# Patient Record
Sex: Female | Born: 1955
Health system: Southern US, Community
[De-identification: ages and names within clinical notes are randomized; demographics above are authoritative.]

## PROBLEM LIST (undated history)

## (undated) DIAGNOSIS — M25475 Effusion, left foot: Secondary | ICD-10-CM

## (undated) DIAGNOSIS — K5792 Diverticulitis of intestine, part unspecified, without perforation or abscess without bleeding: Secondary | ICD-10-CM

## (undated) DIAGNOSIS — M25471 Effusion, right ankle: Secondary | ICD-10-CM

## (undated) DIAGNOSIS — M25474 Effusion, right foot: Secondary | ICD-10-CM

## (undated) DIAGNOSIS — Z91018 Allergy to other foods: Secondary | ICD-10-CM

## (undated) DIAGNOSIS — E039 Hypothyroidism, unspecified: Secondary | ICD-10-CM

## (undated) DIAGNOSIS — I10 Essential (primary) hypertension: Secondary | ICD-10-CM

## (undated) DIAGNOSIS — M25472 Effusion, left ankle: Secondary | ICD-10-CM

## (undated) DIAGNOSIS — F419 Anxiety disorder, unspecified: Secondary | ICD-10-CM

## (undated) DIAGNOSIS — E785 Hyperlipidemia, unspecified: Secondary | ICD-10-CM

## (undated) DIAGNOSIS — I890 Lymphedema, not elsewhere classified: Secondary | ICD-10-CM

## (undated) DIAGNOSIS — E119 Type 2 diabetes mellitus without complications: Secondary | ICD-10-CM

## (undated) DIAGNOSIS — R7303 Prediabetes: Secondary | ICD-10-CM

## (undated) DIAGNOSIS — E669 Obesity, unspecified: Secondary | ICD-10-CM

## (undated) DIAGNOSIS — E079 Disorder of thyroid, unspecified: Secondary | ICD-10-CM

## (undated) HISTORY — DX: Effusion, left ankle: M25.472

## (undated) HISTORY — DX: Effusion, right foot: M25.474

## (undated) HISTORY — PX: ECTOPIC PREGNANCY SURGERY: SHX613

## (undated) HISTORY — DX: Obesity, unspecified: E66.9

## (undated) HISTORY — DX: Type 2 diabetes mellitus without complications: E11.9

## (undated) HISTORY — DX: Allergy to other foods: Z91.018

## (undated) HISTORY — DX: Hyperlipidemia, unspecified: E78.5

## (undated) HISTORY — DX: Hypothyroidism, unspecified: E03.9

## (undated) HISTORY — DX: Lymphedema, not elsewhere classified: I89.0

## (undated) HISTORY — DX: Effusion, right ankle: M25.471

## (undated) HISTORY — DX: Diverticulitis of intestine, part unspecified, without perforation or abscess without bleeding: K57.92

## (undated) HISTORY — DX: Essential (primary) hypertension: I10

## (undated) HISTORY — DX: Disorder of thyroid, unspecified: E07.9

## (undated) HISTORY — DX: Prediabetes: R73.03

## (undated) HISTORY — DX: Effusion, left foot: M25.475

## (undated) HISTORY — DX: Anxiety disorder, unspecified: F41.9

---

## 1998-09-19 ENCOUNTER — Ambulatory Visit (HOSPITAL_COMMUNITY): Admission: RE | Admit: 1998-09-19 | Discharge: 1998-09-19 | Payer: Self-pay | Admitting: Obstetrics and Gynecology

## 1998-09-19 ENCOUNTER — Encounter: Payer: Self-pay | Admitting: Obstetrics and Gynecology

## 2000-02-27 ENCOUNTER — Other Ambulatory Visit: Admission: RE | Admit: 2000-02-27 | Discharge: 2000-02-27 | Payer: Self-pay | Admitting: Obstetrics and Gynecology

## 2000-05-12 ENCOUNTER — Emergency Department (HOSPITAL_COMMUNITY): Admission: EM | Admit: 2000-05-12 | Discharge: 2000-05-12 | Payer: Self-pay

## 2001-02-27 ENCOUNTER — Other Ambulatory Visit: Admission: RE | Admit: 2001-02-27 | Discharge: 2001-02-27 | Payer: Self-pay | Admitting: Obstetrics and Gynecology

## 2004-09-07 ENCOUNTER — Ambulatory Visit: Payer: Self-pay | Admitting: Family Medicine

## 2004-09-13 ENCOUNTER — Ambulatory Visit: Payer: Self-pay | Admitting: Family Medicine

## 2005-05-22 ENCOUNTER — Ambulatory Visit: Payer: Self-pay | Admitting: Family Medicine

## 2005-07-09 ENCOUNTER — Ambulatory Visit: Payer: Self-pay | Admitting: Family Medicine

## 2006-02-14 ENCOUNTER — Ambulatory Visit: Payer: Self-pay | Admitting: Family Medicine

## 2006-06-11 ENCOUNTER — Ambulatory Visit: Payer: Self-pay | Admitting: Family Medicine

## 2006-12-16 ENCOUNTER — Ambulatory Visit: Payer: Self-pay | Admitting: Family Medicine

## 2006-12-17 ENCOUNTER — Encounter: Payer: Self-pay | Admitting: Family Medicine

## 2006-12-30 ENCOUNTER — Ambulatory Visit: Payer: Self-pay | Admitting: Family Medicine

## 2006-12-30 LAB — CONVERTED CEMR LAB
ALT: 19 units/L (ref 0–40)
AST: 20 units/L (ref 0–37)
Albumin: 3.5 g/dL (ref 3.5–5.2)
Alkaline Phosphatase: 53 units/L (ref 39–117)
Basophils Absolute: 0.1 10*3/uL (ref 0.0–0.1)
Basophils Relative: 2.3 % — ABNORMAL HIGH (ref 0.0–1.0)
Bilirubin, Direct: 0.1 mg/dL (ref 0.0–0.3)
Eosinophils Absolute: 0.1 10*3/uL (ref 0.0–0.6)
Eosinophils Relative: 3.1 % (ref 0.0–5.0)
Free T4: 0.7 ng/dL (ref 0.6–1.6)
HCT: 37 % (ref 36.0–46.0)
Hemoglobin: 12.6 g/dL (ref 12.0–15.0)
Lymphocytes Relative: 37.6 % (ref 12.0–46.0)
MCHC: 34.1 g/dL (ref 30.0–36.0)
MCV: 88.5 fL (ref 78.0–100.0)
Monocytes Absolute: 0.4 10*3/uL (ref 0.2–0.7)
Monocytes Relative: 9.5 % (ref 3.0–11.0)
Neutro Abs: 1.7 10*3/uL (ref 1.4–7.7)
Neutrophils Relative %: 47.5 % (ref 43.0–77.0)
Platelets: 226 10*3/uL (ref 150–400)
RBC: 4.18 M/uL (ref 3.87–5.11)
RDW: 14.2 % (ref 11.5–14.6)
T3, Free: 2.5 pg/mL (ref 2.3–4.2)
TSH: 4.61 microintl units/mL (ref 0.35–5.50)
Total Bilirubin: 0.6 mg/dL (ref 0.3–1.2)
Total Protein: 7 g/dL (ref 6.0–8.3)
WBC: 3.7 10*3/uL — ABNORMAL LOW (ref 4.5–10.5)

## 2007-04-21 ENCOUNTER — Telehealth: Payer: Self-pay | Admitting: Family Medicine

## 2007-04-21 DIAGNOSIS — E039 Hypothyroidism, unspecified: Secondary | ICD-10-CM | POA: Insufficient documentation

## 2007-04-30 ENCOUNTER — Encounter (INDEPENDENT_AMBULATORY_CARE_PROVIDER_SITE_OTHER): Payer: Self-pay | Admitting: *Deleted

## 2007-06-30 ENCOUNTER — Encounter: Payer: Self-pay | Admitting: Family Medicine

## 2007-08-03 ENCOUNTER — Telehealth (INDEPENDENT_AMBULATORY_CARE_PROVIDER_SITE_OTHER): Payer: Self-pay | Admitting: *Deleted

## 2007-11-17 ENCOUNTER — Ambulatory Visit: Payer: Self-pay | Admitting: Family Medicine

## 2007-11-17 DIAGNOSIS — I10 Essential (primary) hypertension: Secondary | ICD-10-CM | POA: Insufficient documentation

## 2007-11-17 DIAGNOSIS — R079 Chest pain, unspecified: Secondary | ICD-10-CM | POA: Insufficient documentation

## 2007-11-19 ENCOUNTER — Encounter (INDEPENDENT_AMBULATORY_CARE_PROVIDER_SITE_OTHER): Payer: Self-pay | Admitting: *Deleted

## 2007-12-04 ENCOUNTER — Telehealth (INDEPENDENT_AMBULATORY_CARE_PROVIDER_SITE_OTHER): Payer: Self-pay | Admitting: *Deleted

## 2007-12-10 ENCOUNTER — Ambulatory Visit: Payer: Self-pay | Admitting: Family Medicine

## 2008-01-05 ENCOUNTER — Encounter: Payer: Self-pay | Admitting: Family Medicine

## 2008-04-26 ENCOUNTER — Ambulatory Visit: Payer: Self-pay | Admitting: Family Medicine

## 2008-04-26 ENCOUNTER — Encounter (INDEPENDENT_AMBULATORY_CARE_PROVIDER_SITE_OTHER): Payer: Self-pay | Admitting: *Deleted

## 2008-04-26 DIAGNOSIS — O009 Unspecified ectopic pregnancy without intrauterine pregnancy: Secondary | ICD-10-CM

## 2008-05-02 ENCOUNTER — Ambulatory Visit: Payer: Self-pay | Admitting: Gastroenterology

## 2008-05-05 ENCOUNTER — Encounter (INDEPENDENT_AMBULATORY_CARE_PROVIDER_SITE_OTHER): Payer: Self-pay | Admitting: *Deleted

## 2008-06-17 ENCOUNTER — Encounter: Payer: Self-pay | Admitting: Gastroenterology

## 2008-06-17 ENCOUNTER — Ambulatory Visit: Payer: Self-pay | Admitting: Gastroenterology

## 2008-06-21 ENCOUNTER — Encounter: Payer: Self-pay | Admitting: Gastroenterology

## 2008-08-22 ENCOUNTER — Ambulatory Visit: Payer: Self-pay | Admitting: Family Medicine

## 2008-08-22 DIAGNOSIS — R609 Edema, unspecified: Secondary | ICD-10-CM

## 2008-08-23 LAB — CONVERTED CEMR LAB
BUN: 12 mg/dL (ref 6–23)
CO2: 29 meq/L (ref 19–32)
Calcium: 9 mg/dL (ref 8.4–10.5)
Chloride: 105 meq/L (ref 96–112)
Creatinine, Ser: 0.8 mg/dL (ref 0.4–1.2)
GFR calc Af Amer: 97 mL/min
GFR calc non Af Amer: 80 mL/min
Glucose, Bld: 89 mg/dL (ref 70–99)
Potassium: 3.7 meq/L (ref 3.5–5.1)
Sodium: 141 meq/L (ref 135–145)

## 2008-08-24 ENCOUNTER — Telehealth (INDEPENDENT_AMBULATORY_CARE_PROVIDER_SITE_OTHER): Payer: Self-pay | Admitting: *Deleted

## 2009-04-20 ENCOUNTER — Encounter (INDEPENDENT_AMBULATORY_CARE_PROVIDER_SITE_OTHER): Payer: Self-pay | Admitting: *Deleted

## 2009-05-12 ENCOUNTER — Telehealth (INDEPENDENT_AMBULATORY_CARE_PROVIDER_SITE_OTHER): Payer: Self-pay | Admitting: *Deleted

## 2009-06-16 ENCOUNTER — Ambulatory Visit: Payer: Self-pay | Admitting: Family Medicine

## 2009-06-16 DIAGNOSIS — R300 Dysuria: Secondary | ICD-10-CM | POA: Insufficient documentation

## 2009-06-17 ENCOUNTER — Encounter: Payer: Self-pay | Admitting: Family Medicine

## 2009-06-20 ENCOUNTER — Encounter: Payer: Self-pay | Admitting: Family Medicine

## 2009-07-17 ENCOUNTER — Telehealth: Payer: Self-pay | Admitting: Family Medicine

## 2010-02-26 ENCOUNTER — Telehealth (INDEPENDENT_AMBULATORY_CARE_PROVIDER_SITE_OTHER): Payer: Self-pay | Admitting: *Deleted

## 2010-03-02 ENCOUNTER — Encounter (INDEPENDENT_AMBULATORY_CARE_PROVIDER_SITE_OTHER): Payer: Self-pay | Admitting: *Deleted

## 2010-03-20 ENCOUNTER — Ambulatory Visit: Payer: Self-pay | Admitting: Family Medicine

## 2010-03-30 ENCOUNTER — Ambulatory Visit: Payer: Self-pay | Admitting: Family Medicine

## 2010-04-02 ENCOUNTER — Telehealth (INDEPENDENT_AMBULATORY_CARE_PROVIDER_SITE_OTHER): Payer: Self-pay | Admitting: *Deleted

## 2010-04-02 LAB — CONVERTED CEMR LAB
ALT: 20 units/L (ref 0–35)
AST: 18 units/L (ref 0–37)
Albumin: 3.9 g/dL (ref 3.5–5.2)
Alkaline Phosphatase: 84 units/L (ref 39–117)
BUN: 16 mg/dL (ref 6–23)
Bilirubin, Direct: 0.1 mg/dL (ref 0.0–0.3)
CO2: 30 meq/L (ref 19–32)
Calcium: 9 mg/dL (ref 8.4–10.5)
Chloride: 108 meq/L (ref 96–112)
Cholesterol: 227 mg/dL — ABNORMAL HIGH (ref 0–200)
Creatinine, Ser: 0.8 mg/dL (ref 0.4–1.2)
Direct LDL: 176.9 mg/dL
GFR calc non Af Amer: 92.07 mL/min (ref >60)
Glucose, Bld: 88 mg/dL (ref 70–99)
HDL: 39.8 mg/dL (ref >39.00)
Potassium: 4.1 meq/L (ref 3.5–5.1)
Sodium: 144 meq/L (ref 135–145)
Total Bilirubin: 0.3 mg/dL (ref 0.3–1.2)
Total CHOL/HDL Ratio: 6
Total Protein: 7.5 g/dL (ref 6.0–8.3)
Triglycerides: 117 mg/dL (ref 0.0–149.0)
VLDL: 23.4 mg/dL (ref 0.0–40.0)

## 2010-04-17 ENCOUNTER — Ambulatory Visit: Payer: Self-pay | Admitting: Family Medicine

## 2010-05-25 ENCOUNTER — Ambulatory Visit: Payer: Self-pay | Admitting: Family Medicine

## 2010-06-08 ENCOUNTER — Ambulatory Visit: Payer: Self-pay | Admitting: Family Medicine

## 2010-06-08 DIAGNOSIS — H6590 Unspecified nonsuppurative otitis media, unspecified ear: Secondary | ICD-10-CM | POA: Insufficient documentation

## 2010-06-13 LAB — CONVERTED CEMR LAB
ALT: 19 units/L (ref 0–35)
AST: 18 units/L (ref 0–37)
Albumin: 3.6 g/dL (ref 3.5–5.2)
Alkaline Phosphatase: 73 units/L (ref 39–117)
BUN: 13 mg/dL (ref 6–23)
Basophils Absolute: 0 10*3/uL (ref 0.0–0.1)
Basophils Relative: 0.7 % (ref 0.0–3.0)
Bilirubin, Direct: 0.2 mg/dL (ref 0.0–0.3)
CO2: 28 meq/L (ref 19–32)
Calcium: 8.8 mg/dL (ref 8.4–10.5)
Chloride: 103 meq/L (ref 96–112)
Cholesterol: 210 mg/dL — ABNORMAL HIGH (ref 0–200)
Creatinine, Ser: 0.8 mg/dL (ref 0.4–1.2)
Direct LDL: 163.7 mg/dL
Eosinophils Absolute: 0.1 10*3/uL (ref 0.0–0.7)
Eosinophils Relative: 3.1 % (ref 0.0–5.0)
GFR calc non Af Amer: 100.32 mL/min (ref >60)
Glucose, Bld: 82 mg/dL (ref 70–99)
HCT: 37.9 % (ref 36.0–46.0)
HDL: 38.8 mg/dL — ABNORMAL LOW (ref >39.00)
Hemoglobin: 12.8 g/dL (ref 12.0–15.0)
Lymphocytes Relative: 41.9 % (ref 12.0–46.0)
Lymphs Abs: 1.7 10*3/uL (ref 0.7–4.0)
MCHC: 33.8 g/dL (ref 30.0–36.0)
MCV: 89.3 fL (ref 78.0–100.0)
Monocytes Absolute: 0.3 10*3/uL (ref 0.1–1.0)
Monocytes Relative: 8.1 % (ref 3.0–12.0)
Neutro Abs: 1.9 10*3/uL (ref 1.4–7.7)
Neutrophils Relative %: 46.2 % (ref 43.0–77.0)
Platelets: 185 10*3/uL (ref 150.0–400.0)
Potassium: 4 meq/L (ref 3.5–5.1)
RBC: 4.24 M/uL (ref 3.87–5.11)
RDW: 14.9 % — ABNORMAL HIGH (ref 11.5–14.6)
Sodium: 139 meq/L (ref 135–145)
TSH: 0.3 microintl units/mL — ABNORMAL LOW (ref 0.35–5.50)
Total Bilirubin: 0.6 mg/dL (ref 0.3–1.2)
Total CHOL/HDL Ratio: 5
Total Protein: 6.9 g/dL (ref 6.0–8.3)
Triglycerides: 88 mg/dL (ref 0.0–149.0)
VLDL: 17.6 mg/dL (ref 0.0–40.0)
WBC: 4 10*3/uL — ABNORMAL LOW (ref 4.5–10.5)

## 2010-06-27 ENCOUNTER — Ambulatory Visit: Payer: Self-pay | Admitting: Family Medicine

## 2010-06-27 DIAGNOSIS — E785 Hyperlipidemia, unspecified: Secondary | ICD-10-CM | POA: Insufficient documentation

## 2010-06-27 DIAGNOSIS — R011 Cardiac murmur, unspecified: Secondary | ICD-10-CM

## 2010-07-12 ENCOUNTER — Ambulatory Visit (HOSPITAL_COMMUNITY)
Admission: RE | Admit: 2010-07-12 | Discharge: 2010-07-12 | Payer: Self-pay | Source: Home / Self Care | Admitting: Family Medicine

## 2010-07-12 ENCOUNTER — Encounter: Payer: Self-pay | Admitting: Family Medicine

## 2010-07-12 ENCOUNTER — Ambulatory Visit: Payer: Self-pay | Admitting: Cardiovascular Disease

## 2010-07-12 ENCOUNTER — Ambulatory Visit: Payer: Self-pay

## 2010-07-20 ENCOUNTER — Ambulatory Visit: Payer: Self-pay | Admitting: Family Medicine

## 2010-08-28 ENCOUNTER — Telehealth (INDEPENDENT_AMBULATORY_CARE_PROVIDER_SITE_OTHER): Payer: Self-pay | Admitting: *Deleted

## 2010-11-04 LAB — CONVERTED CEMR LAB
ALT: 16 units/L (ref 0–35)
ALT: 25 units/L (ref 0–35)
AST: 15 units/L (ref 0–37)
AST: 22 units/L (ref 0–37)
Albumin: 3.5 g/dL (ref 3.5–5.2)
Albumin: 3.7 g/dL (ref 3.5–5.2)
Alkaline Phosphatase: 60 units/L (ref 39–117)
Alkaline Phosphatase: 64 units/L (ref 39–117)
BUN: 10 mg/dL (ref 6–23)
BUN: 10 mg/dL (ref 6–23)
BUN: 13 mg/dL (ref 6–23)
Basophils Absolute: 0 10*3/uL (ref 0.0–0.1)
Basophils Absolute: 0 10*3/uL (ref 0.0–0.1)
Basophils Relative: 0.7 % (ref 0.0–3.0)
Basophils Relative: 1.1 % (ref 0.0–3.0)
Bilirubin Urine: NEGATIVE
Bilirubin, Direct: 0 mg/dL (ref 0.0–0.3)
Bilirubin, Direct: 0.1 mg/dL (ref 0.0–0.3)
CO2: 29 meq/L (ref 19–32)
CO2: 30 meq/L (ref 19–32)
CO2: 30 meq/L (ref 19–32)
Calcium: 8.8 mg/dL (ref 8.4–10.5)
Calcium: 8.8 mg/dL (ref 8.4–10.5)
Calcium: 9.2 mg/dL (ref 8.4–10.5)
Chloride: 102 meq/L (ref 96–112)
Chloride: 105 meq/L (ref 96–112)
Chloride: 106 meq/L (ref 96–112)
Cholesterol: 172 mg/dL (ref 0–200)
Cholesterol: 190 mg/dL (ref 0–200)
Creatinine, Ser: 0.7 mg/dL (ref 0.4–1.2)
Creatinine, Ser: 0.8 mg/dL (ref 0.4–1.2)
Creatinine, Ser: 0.8 mg/dL (ref 0.4–1.2)
Eosinophils Absolute: 0.1 10*3/uL (ref 0.0–0.7)
Eosinophils Absolute: 0.1 10*3/uL (ref 0.0–0.7)
Eosinophils Relative: 2.2 % (ref 0.0–5.0)
Eosinophils Relative: 3 % (ref 0.0–5.0)
GFR calc Af Amer: 97 mL/min
GFR calc Af Amer: 97 mL/min
GFR calc non Af Amer: 112.4 mL/min (ref >60)
GFR calc non Af Amer: 80 mL/min
GFR calc non Af Amer: 80 mL/min
Glucose, Bld: 86 mg/dL (ref 70–99)
Glucose, Bld: 98 mg/dL (ref 70–99)
Glucose, Bld: 99 mg/dL (ref 70–99)
Glucose, Urine, Semiquant: NEGATIVE
HCT: 35.9 % — ABNORMAL LOW (ref 36.0–46.0)
HCT: 35.9 % — ABNORMAL LOW (ref 36.0–46.0)
HDL: 34.9 mg/dL — ABNORMAL LOW (ref >39.00)
HDL: 38.5 mg/dL — ABNORMAL LOW (ref >39.0)
Hemoglobin: 12.2 g/dL (ref 12.0–15.0)
Hemoglobin: 12.4 g/dL (ref 12.0–15.0)
Ketones, urine, test strip: NEGATIVE
LDL Cholesterol: 125 mg/dL — ABNORMAL HIGH (ref 0–99)
LDL Cholesterol: 137 mg/dL — ABNORMAL HIGH (ref 0–99)
Lymphocytes Relative: 35 % (ref 12.0–46.0)
Lymphocytes Relative: 36.3 % (ref 12.0–46.0)
Lymphs Abs: 1.5 10*3/uL (ref 0.7–4.0)
MCHC: 34.1 g/dL (ref 30.0–36.0)
MCHC: 34.7 g/dL (ref 30.0–36.0)
MCV: 87.4 fL (ref 78.0–100.0)
MCV: 91.9 fL (ref 78.0–100.0)
Monocytes Absolute: 0.4 10*3/uL (ref 0.1–1.0)
Monocytes Absolute: 0.4 10*3/uL (ref 0.1–1.0)
Monocytes Relative: 8.3 % (ref 3.0–12.0)
Monocytes Relative: 9.3 % (ref 3.0–12.0)
Neutro Abs: 1.9 10*3/uL (ref 1.4–7.7)
Neutro Abs: 2.3 10*3/uL (ref 1.4–7.7)
Neutrophils Relative %: 50.3 % (ref 43.0–77.0)
Neutrophils Relative %: 53.8 % (ref 43.0–77.0)
Nitrite: NEGATIVE
Platelets: 182 10*3/uL (ref 150.0–400.0)
Platelets: 246 10*3/uL (ref 150–400)
Potassium: 3.7 meq/L (ref 3.5–5.1)
Potassium: 3.7 meq/L (ref 3.5–5.1)
Potassium: 4.1 meq/L (ref 3.5–5.1)
RBC: 3.91 M/uL (ref 3.87–5.11)
RBC: 4.11 M/uL (ref 3.87–5.11)
RDW: 13.6 % (ref 11.5–14.6)
RDW: 14.9 % — ABNORMAL HIGH (ref 11.5–14.6)
Sodium: 138 meq/L (ref 135–145)
Sodium: 139 meq/L (ref 135–145)
Sodium: 140 meq/L (ref 135–145)
Specific Gravity, Urine: 1.02
TSH: 1.42 microintl units/mL (ref 0.35–5.50)
Total Bilirubin: 0.6 mg/dL (ref 0.3–1.2)
Total Bilirubin: 0.8 mg/dL (ref 0.3–1.2)
Total CHOL/HDL Ratio: 4.9
Total CHOL/HDL Ratio: 5
Total Protein: 6.7 g/dL (ref 6.0–8.3)
Total Protein: 7.3 g/dL (ref 6.0–8.3)
Triglycerides: 60 mg/dL (ref 0.0–149.0)
Triglycerides: 71 mg/dL (ref 0–149)
Urobilinogen, UA: 0.2
VLDL: 12 mg/dL (ref 0.0–40.0)
VLDL: 14 mg/dL (ref 0–40)
WBC Urine, dipstick: NEGATIVE
WBC: 3.8 10*3/uL — ABNORMAL LOW (ref 4.5–10.5)
WBC: 4.3 10*3/uL — ABNORMAL LOW (ref 4.5–10.5)
pH: 6.5

## 2010-11-08 NOTE — Progress Notes (Signed)
Summary: --reaction to med  Phone Note Refill Request Call back at Work Phone [phone]   Refills Requested: Medication #1:  EXFORGE 10-320 MG TABS 1 by mouth once daily Pt BP med was just increased to the following. Pt was advise to call if sample cause her any problems. Pt states that she is having swelling in ankle and feet.Marland KitchenMarland KitchenPt uses [facility].Marland Kitchen Pls advise.........Marland KitchenFelecia Deloach CMA  August 28, 2010 3:56 PM    Follow-up for Phone Call        decrease back to exforge 5/320 increase toprol 100mg   1/2 in am and 1 at night Follow-up by: Loreen Freud DO,  August 28, 2010 4:02 PM  Additional Follow-up for Phone Call Additional follow up Details #1::        left message to call office...........Marland KitchenFelecia Deloach CMA  August 28, 2010 4:08 PM   Pt aware, rx sent to pharmacy...................Marland KitchenFelecia Deloach CMA  August 28, 2010 4:23 PM     New/Updated Medications: TOPROL XL 100 MG XR24H-TAB (METOPROLOL SUCCINATE) Take 1/2 tab in am and 1 tab at night EXFORGE 5-320 MG TABS (AMLODIPINE BESYLATE-VALSARTAN) Take 1 tab once daily Prescriptions: TOPROL XL 100 MG XR24H-TAB (METOPROLOL SUCCINATE) Take 1/2 tab in am and 1 tab at night  #45 x 3   Entered by:   Jeremy Johann CMA   Authorized by:   Loreen Freud DO   Signed by:   Jeremy Johann CMA on 08/28/2010   Method used:   Faxed to ...       [facility] #[id number]* (retail)       [address]       [place], [place]  [address]       Ph: [phone]       Fax: [phone]   RxID:   [id number] EXFORGE 5-320 MG TABS (AMLODIPINE BESYLATE-VALSARTAN) Take 1 tab once daily  #90 x 3   Entered by:   Jeremy Johann CMA   Authorized by:   Loreen Freud DO   Signed by:   Jeremy Johann CMA on 08/28/2010   Method used:   Faxed to ...       [facility] #[id number]* (retail)       [address]       [place], [place]  [address]       Ph:  [phone]       Fax: [phone]   RxID:   [id number]

## 2010-11-08 NOTE — Assessment & Plan Note (Signed)
Summary: bp check/cbs   Vital Signs:  Patient profile:   55 year old female Weight:      278. pounds Temp:     98.3 degrees F oral Pulse rate:   68 / minute Pulse rhythm:   regular BP sitting:   160 / 100  (left arm) Cuff size:   large  Vitals Entered By: Almeta Monas CMA Duncan Dull) (June 27, 2010 9:49 AM) CC: BP CHECK and discuss welchol    Current Medications (verified): 1)  Aciphex 20 Mg Tbec (Rabeprazole Sodium) .... Take 1 Tablet By Mouth Once A Day 2)  Furosemide 40 Mg  Tabs (Furosemide) .... Take One Tablet Once Daily 3)  Toprol Xl 100 Mg Xr24h-Tab (Metoprolol Succinate) .Marland Kitchen.. 1 By Mouth Once Daily 4)  Potassium Chloride Crys Cr 20 Meq Cr-Tabs (Potassium Chloride Crys Cr) .... Take 1 Tab Once Daily  Pt Needs  Labs Done For Future Refills 5)  Synthroid 100 Mcg Tabs (Levothyroxine Sodium) .... Take One Tablet By Mouth Every Day**labs Due Now** 6)  Cytomel 5 Mcg Tabs (Liothyronine Sodium) .... Take 2 Tablets By Mouth Two Times A Day 7)  Lipitor 10 Mg Tabs (Atorvastatin Calcium) .Marland Kitchen.. 1 By Mouth At Bedtime. 8)  Diovan 320 Mg Tabs (Valsartan) .Marland Kitchen.. 1 By Mouth Once Daily 9)  Bd Assure Bpm/manual Arm Cuff  Misc (Blood Pressure Monitoring) .... Monitor Bp Daily 10)  Nasonex 50 Mcg/act Susp (Mometasone Furoate) .... 2 Sprays Each Nostril Once Daily 11)  Claritin 10 Mg Tabs (Loratadine) .Marland Kitchen.. 1 By Mouth Once Daily As Needed  Allergies (verified): 1)  ! Sulfa 2)  ! * Contrast Dye  Past History:  Past Medical History: Hypertension Current Problems:  MORBID OBESITY (ICD-278.01) CHEST PAIN UNSPECIFIED (ICD-786.50) FAMILY HISTORY OF CAD FEMALE 1ST DEGREE RELATIVE <60 (ICD-V16.49) HYPERTENSION (ICD-401.9) HYPOTHYROIDISM NOS (ICD-244.9) Hyperlipidemia   Impression & Recommendations:  Problem # 1:  HYPERTENSION (ICD-401.9)  Her updated medication list for this problem includes:    Furosemide 40 Mg Tabs (Furosemide) .Marland Kitchen... Take one tablet once daily    Toprol Xl 100 Mg  Xr24h-tab (Metoprolol succinate) .Marland Kitchen... 1 by mouth once daily    Exforge 5-320 Mg Tabs (Amlodipine besylate-valsartan) .Marland Kitchen... 1 by mouth once daily  Orders: Echo Referral (Echo)  BP today: 160/100 Prior BP: 160/110 (06/08/2010)  Labs Reviewed: K+: 4.0 (06/08/2010) Creat: : 0.8 (06/08/2010)   Chol: 210 (06/08/2010)   HDL: 38.80 (06/08/2010)   LDL: 125 (06/16/2009)   TG: 88.0 (06/08/2010)  Problem # 2:  CARDIAC MURMUR (ICD-785.2)  Orders: Echo Referral (Echo)  EKG obtained (see interpretation); Will refer to cardiology for evaluation of this murmur.   Problem # 3:  HYPOTHYROIDISM NOS (ICD-244.9)  Her updated medication list for this problem includes:    Synthroid 100 Mcg Tabs (Levothyroxine sodium) .Marland Kitchen... Take one tablet by mouth every day**labs due now**    Cytomel 5 Mcg Tabs (Liothyronine sodium) .Marland Kitchen... Take 2 tablets by mouth two times a day  Labs Reviewed: TSH: 0.30 (06/08/2010)    Chol: 210 (06/08/2010)   HDL: 38.80 (06/08/2010)   LDL: 125 (06/16/2009)   TG: 88.0 (06/08/2010)  Problem # 4:  HYPERLIPIDEMIA (ICD-272.4) lipitor caused muscle aches Her updated medication list for this problem includes:    Welchol 3.75 Gm Pack (Colesevelam hcl) .Marland Kitchen... 1 pack dailyas directed  Labs Reviewed: SGOT: 18 (06/08/2010)   SGPT: 19 (06/08/2010)   HDL:38.80 (06/08/2010), 39.80 (03/30/2010)  LDL:125 (06/16/2009), 137 (04/26/2008)  Chol:210 (06/08/2010), 227 (03/30/2010)  Trig:88.0 (06/08/2010), 117.0 (03/30/2010)  Complete Medication List: 1)  Aciphex 20 Mg Tbec (Rabeprazole sodium) .... Take 1 tablet by mouth once a day 2)  Furosemide 40 Mg Tabs (Furosemide) .... Take one tablet once daily 3)  Toprol Xl 100 Mg Xr24h-tab (Metoprolol succinate) .Marland Kitchen.. 1 by mouth once daily 4)  Potassium Chloride Crys Cr 20 Meq Cr-tabs (Potassium chloride crys cr) .... Take 1 tab once daily  pt needs  labs done for future refills 5)  Synthroid 100 Mcg Tabs (Levothyroxine sodium) .... Take one tablet by mouth  every day**labs due now** 6)  Cytomel 5 Mcg Tabs (Liothyronine sodium) .... Take 2 tablets by mouth two times a day 7)  Welchol 3.75 Gm Pack (Colesevelam hcl) .Marland Kitchen.. 1 pack dailyas directed 8)  Exforge 5-320 Mg Tabs (Amlodipine besylate-valsartan) .Marland Kitchen.. 1 by mouth once daily 9)  Bd Assure Bpm/manual Arm Cuff Misc (Blood pressure monitoring) .... Monitor bp daily 10)  Nasonex 50 Mcg/act Susp (Mometasone furoate) .... 2 sprays each nostril once daily 11)  Claritin 10 Mg Tabs (Loratadine) .Marland Kitchen.. 1 by mouth once daily as needed  Patient Instructions: 1)  Please schedule a follow-up appointment in 2 weeks.  2)  repeat labs in 3 months Prescriptions: ACIPHEX 20 MG TBEC (RABEPRAZOLE SODIUM) Take 1 tablet by mouth once a day  #30.0 Each x 11   Entered and Authorized by:   Loreen Freud DO   Signed by:   Loreen Freud DO on 06/27/2010   Method used:   Electronically to        [facility] #[id number]* (retail)       [address]       [place], [place]  [address]       Ph: [phone]       Fax: [phone]   RxID:   [id number] EXFORGE 5-320 MG TABS (AMLODIPINE BESYLATE-VALSARTAN) 1 by mouth once daily  #90 x 3   Entered and Authorized by:   Loreen Freud DO   Signed by:   Loreen Freud DO on 06/27/2010   Method used:   Print then Give to Patient   RxID:   [id number]   Appended Document: bp check/cbs     Allergies: 1)  ! Sulfa 2)  ! * Contrast Dye  Physical Exam  General:  Well-developed,well-nourished,in no acute distress; alert,appropriate and cooperative throughout examination Neck:  No deformities, masses, or tenderness noted. Lungs:  Normal respiratory effort, chest expands symmetrically. Lungs are clear to auscultation, no crackles or wheezes. Heart:  normal rate and no murmur.   Extremities:  trace left pedal edema and trace right pedal edema.   Psych:  Oriented X3 and normally interactive.     Complete Medication List: 1)   Aciphex 20 Mg Tbec (Rabeprazole sodium) .... Take 1 tablet by mouth once a day 2)  Furosemide 40 Mg Tabs (Furosemide) .... Take one tablet once daily 3)  Toprol Xl 100 Mg Xr24h-tab (Metoprolol succinate) .Marland Kitchen.. 1 by mouth once daily 4)  Potassium Chloride Crys Cr 20 Meq Cr-tabs (Potassium chloride crys cr) .... Take 1 tab once daily  pt needs  labs done for future refills 5)  Synthroid 88 Mcg Tabs (Levothyroxine sodium) .Marland Kitchen.. 1 by mouth qd 6)  Cytomel 5 Mcg Tabs (Liothyronine sodium) .... Take 2 tablets by mouth two times a day 7)  Welchol 3.75 Gm Pack (Colesevelam hcl) .Marland Kitchen.. 1 pack dailyas directed 8)  Exforge 5-320 Mg Tabs (Amlodipine besylate-valsartan) .Marland Kitchen.. 1 by  mouth once daily 9)  Bd Assure Bpm/manual Arm Cuff Misc (Blood pressure monitoring) .... Monitor bp daily 10)  Nasonex 50 Mcg/act Susp (Mometasone furoate) .... 2 sprays each nostril once daily 11)  Claritin 10 Mg Tabs (Loratadine) .Marland Kitchen.. 1 by mouth once daily as needed

## 2010-11-08 NOTE — Assessment & Plan Note (Signed)
Summary: bp check//kn   Vital Signs:  Patient profile:   55 year old female Height:      66 inches Weight:      276 pounds BMI:     44.71 Pulse rate:   84 / minute Pulse rhythm:   regular BP sitting:   170 / 96  (left arm) Cuff size:   large  Vitals Entered By: Army Fossa CMA (March 20, 2010 3:48 PM) CC: Pt here to f/u on BP.   History of Present Illness:  Hypertension follow-up      This is a 55 year old woman who presents for Hypertension follow-up.  The patient denies lightheadedness, urinary frequency, headaches, edema, impotence, rash, and fatigue.  The patient denies the following associated symptoms: chest pain, chest pressure, exercise intolerance, dyspnea, palpitations, syncope, leg edema, and pedal edema.  Compliance with medications (by patient report) has been near 100%.  The patient reports that dietary compliance has been good.  The patient reports no exercise.  Adjunctive measures currently used by the patient include salt restriction.    Current Medications (verified): 1)  Aciphex 20 Mg Tbec (Rabeprazole Sodium) .... Take 1 Tablet By Mouth Once A Day 2)  Furosemide 40 Mg  Tabs (Furosemide) .... Take One Tablet Twice Daily 3)  Toprol Xl 100 Mg Xr24h-Tab (Metoprolol Succinate) .Marland Kitchen.. 1 By Mouth Once Daily 4)  Potassium Chloride Crys Cr 20 Meq Cr-Tabs (Potassium Chloride Crys Cr) .... Take 1 Tab Once Daily  Pt Needs  Labs Done For Future Refills 5)  Synthroid 100 Mcg Tabs (Levothyroxine Sodium) .... Take One Tablet By Mouth Every Day**labs Due Now** 6)  Cytomel 5 Mcg Tabs (Liothyronine Sodium) .... Take 2 Tablets By Mouth Two Times A Day  Allergies: 1)  ! Sulfa 2)  ! * Contrast Dye  Past History:  Past medical, surgical, family and social histories (including risk factors) reviewed for relevance to current acute and chronic problems.  Past Medical History: Reviewed history from 04/26/2008 and no changes required. Hypertension Current Problems:  MORBID OBESITY  (ICD-278.01) CHEST PAIN UNSPECIFIED (ICD-786.50) FAMILY HISTORY OF CAD FEMALE 1ST DEGREE RELATIVE <60 (ICD-V16.49) HYPERTENSION (ICD-401.9) HYPOTHYROIDISM NOS (ICD-244.9)  Past Surgical History: Reviewed history from 04/26/2008 and no changes required. ectopic pregnancy  Family History: Reviewed history from 11/17/2007 and no changes required. Family History High cholesterol Family History Hypertension MGF-- DM Family History of CAD Female 1st degree relative <60 --M died at 55 yo  Social History: Reviewed history from 12/10/2007 and no changes required. Occupation: Divorced Former Smoker Alcohol use-yes Drug use-no Regular exercise-yes  Review of Systems      See HPI  Physical Exam  General:  Well-developed,well-nourished,in no acute distress; alert,appropriate and cooperative throughout examination Lungs:  Normal respiratory effort, chest expands symmetrically. Lungs are clear to auscultation, no crackles or wheezes. Heart:  normal rate and no murmur.   Extremities:  left pretibial edema and right pretibial edema.   Psych:  Oriented X3 and normally interactive.     Impression & Recommendations:  Problem # 1:  HYPERTENSION (ICD-401.9)  Her updated medication list for this problem includes:    Furosemide 40 Mg Tabs (Furosemide) .Marland Kitchen... Take one tablet once daily    Toprol Xl 100 Mg Xr24h-tab (Metoprolol succinate) .Marland Kitchen... 1 by mouth once daily  BP today: 170/96 Prior BP: 120/80 (06/16/2009)  Labs Reviewed: K+: 3.7 (06/16/2009) Creat: : 0.7 (06/16/2009)   Chol: 172 (06/16/2009)   HDL: 34.90 (06/16/2009)   LDL: 125 (06/16/2009)  TG: 60.0 (06/16/2009)  Problem # 2:  EDEMA (ICD-782.3)  take 2 lasix a day for 3-4 days then back down to 1 again Her updated medication list for this problem includes:    Furosemide 40 Mg Tabs (Furosemide) .Marland Kitchen... Take one tablet once daily  Discussed elevation of the legs, use of compression stockings, sodium restiction, and medication  use.   Complete Medication List: 1)  Aciphex 20 Mg Tbec (Rabeprazole sodium) .... Take 1 tablet by mouth once a day 2)  Furosemide 40 Mg Tabs (Furosemide) .... Take one tablet once daily 3)  Toprol Xl 100 Mg Xr24h-tab (Metoprolol succinate) .Marland Kitchen.. 1 by mouth once daily 4)  Potassium Chloride Crys Cr 20 Meq Cr-tabs (Potassium chloride crys cr) .... Take 1 tab once daily  pt needs  labs done for future refills 5)  Synthroid 100 Mcg Tabs (Levothyroxine sodium) .... Take one tablet by mouth every day**labs due now** 6)  Cytomel 5 Mcg Tabs (Liothyronine sodium) .... Take 2 tablets by mouth two times a day  Patient Instructions: 1)  fasting labs  401.9  lipid, hep, bmp 2)  Please schedule a follow-up appointment in 2 weeks.  Prescriptions: TOPROL XL 100 MG XR24H-TAB (METOPROLOL SUCCINATE) 1 by mouth once daily  #30 x 2   Entered and Authorized by:   Loreen Freud DO   Signed by:   Loreen Freud DO on 03/20/2010   Method used:   Electronically to        [facility] #[id number]* (retail)       [address]       [place], [place]  [address]       Ph: [phone]       Fax: [phone]   RxID:   [id number]

## 2010-11-08 NOTE — Assessment & Plan Note (Signed)
Summary: roa - lab bmp- hep-lipid:272.4-401.9/cbs   Vital Signs:  Patient profile:   56 year old female Weight:      275.3 pounds Temp:     98.3 degrees F oral Pulse rate:   80 / minute Pulse rhythm:   regular BP sitting:   160 / 110  (left arm)  Vitals Entered By: Almeta Monas CMA Duncan Dull) (June 08, 2010 9:48 AM) CC: BP check and Labs/fasting   History of Present Illness:  Hypertension follow-up      This is a 55 year old woman who presents for Hypertension follow-up.  Pt forgot meds this am but has been taking them everyday.  The patient denies lightheadedness, urinary frequency, headaches, edema, impotence, rash, and fatigue.  The patient denies the following associated symptoms: chest pain, chest pressure, exercise intolerance, dyspnea, palpitations, syncope, leg edema, and pedal edema.  The patient reports that dietary compliance has been good.  Adjunctive measures currently used by the patient include salt restriction.    Pt c/o feeling like R ear is under water.  No other symptoms.  Current Medications (verified): 1)  Aciphex 20 Mg Tbec (Rabeprazole Sodium) .... Take 1 Tablet By Mouth Once A Day 2)  Furosemide 40 Mg  Tabs (Furosemide) .... Take One Tablet Once Daily 3)  Toprol Xl 100 Mg Xr24h-Tab (Metoprolol Succinate) .Marland Kitchen.. 1 By Mouth Once Daily 4)  Potassium Chloride Crys Cr 20 Meq Cr-Tabs (Potassium Chloride Crys Cr) .... Take 1 Tab Once Daily  Pt Needs  Labs Done For Future Refills 5)  Synthroid 100 Mcg Tabs (Levothyroxine Sodium) .... Take One Tablet By Mouth Every Day**labs Due Now** 6)  Cytomel 5 Mcg Tabs (Liothyronine Sodium) .... Take 2 Tablets By Mouth Two Times A Day 7)  Lipitor 10 Mg Tabs (Atorvastatin Calcium) .Marland Kitchen.. 1 By Mouth At Bedtime. 8)  Diovan 320 Mg Tabs (Valsartan) .Marland Kitchen.. 1 By Mouth Once Daily 9)  Bd Assure Bpm/manual Arm Cuff  Misc (Blood Pressure Monitoring) .... Monitor Bp Daily 10)  Nasonex 50 Mcg/act Susp (Mometasone Furoate) .... 2 Sprays Each  Nostril Once Daily 11)  Claritin 10 Mg Tabs (Loratadine) .Marland Kitchen.. 1 By Mouth Once Daily As Needed  Allergies (verified): 1)  ! Sulfa 2)  ! * Contrast Dye  Past History:  Past medical, surgical, family and social histories (including risk factors) reviewed for relevance to current acute and chronic problems.  Past Medical History: Reviewed history from 04/26/2008 and no changes required. Hypertension Current Problems:  MORBID OBESITY (ICD-278.01) CHEST PAIN UNSPECIFIED (ICD-786.50) FAMILY HISTORY OF CAD FEMALE 1ST DEGREE RELATIVE <60 (ICD-V16.49) HYPERTENSION (ICD-401.9) HYPOTHYROIDISM NOS (ICD-244.9)  Past Surgical History: Reviewed history from 04/26/2008 and no changes required. ectopic pregnancy  Family History: Reviewed history from 11/17/2007 and no changes required. Family History High cholesterol Family History Hypertension MGF-- DM Family History of CAD Female 1st degree relative <60 --M died at 55 yo  Social History: Reviewed history from 12/10/2007 and no changes required. Occupation: Divorced Former Smoker Alcohol use-yes Drug use-no Regular exercise-yes  Review of Systems      See HPI  Physical Exam  General:  Well-developed,well-nourished,in no acute distress; alert,appropriate and cooperative throughout examination Ears:  R tm dull L tm normal Lungs:  Normal respiratory effort, chest expands symmetrically. Lungs are clear to auscultation, no crackles or wheezes. Heart:  normal rate and no murmur.   Extremities:  2+ left pedal edema and 2+ right pedal edema.     Impression & Recommendations:  Problem # 1:  HYPERTENSION (ICD-401.9)  Her updated medication list for this problem includes:    Furosemide 40 Mg Tabs (Furosemide) .Marland Kitchen... Take one tablet once daily    Toprol Xl 100 Mg Xr24h-tab (Metoprolol succinate) .Marland Kitchen... 1 by mouth once daily    Diovan 320 Mg Tabs (Valsartan) .Marland Kitchen... 1 by mouth once daily  Orders: Venipuncture ([address]) TLB-Lipid Panel  (80061-LIPID) TLB-BMP (Basic Metabolic Panel-BMET) (80048-METABOL) TLB-CBC Platelet - w/Differential (85025-CBCD) TLB-Hepatic/Liver Function Pnl (80076-HEPATIC) TLB-TSH (Thyroid Stimulating Hormone) (84443-TSH)  BP today: 160/110 Prior BP: 160/100 (05/25/2010)  Labs Reviewed: K+: 4.1 (03/30/2010) Creat: : 0.8 (03/30/2010)   Chol: 227 (03/30/2010)   HDL: 39.80 (03/30/2010)   LDL: 125 (06/16/2009)   TG: 117.0 (03/30/2010)  Problem # 2:  OTITIS MEDIA, SEROUS, RIGHT (ICD-381.4) nasonex claritin rto if no better  Problem # 3:  HYPOTHYROIDISM NOS (ICD-244.9)  Her updated medication list for this problem includes:    Synthroid 100 Mcg Tabs (Levothyroxine sodium) .Marland Kitchen... Take one tablet by mouth every day**labs due now**    Cytomel 5 Mcg Tabs (Liothyronine sodium) .Marland Kitchen... Take 2 tablets by mouth two times a day  Orders: Venipuncture ([address]) TLB-Lipid Panel (80061-LIPID) TLB-BMP (Basic Metabolic Panel-BMET) (80048-METABOL) TLB-CBC Platelet - w/Differential (85025-CBCD) TLB-Hepatic/Liver Function Pnl (80076-HEPATIC) TLB-TSH (Thyroid Stimulating Hormone) (84443-TSH)  Labs Reviewed: TSH: 1.42 (04/26/2008)    Chol: 227 (03/30/2010)   HDL: 39.80 (03/30/2010)   LDL: 125 (06/16/2009)   TG: 117.0 (03/30/2010)  Her updated medication list for this problem includes:    Synthroid 100 Mcg Tabs (Levothyroxine sodium) .Marland Kitchen... Take one tablet by mouth every day**labs due now**    Cytomel 5 Mcg Tabs (Liothyronine sodium) .Marland Kitchen... Take 2 tablets by mouth two times a day  Complete Medication List: 1)  Aciphex 20 Mg Tbec (Rabeprazole sodium) .... Take 1 tablet by mouth once a day 2)  Furosemide 40 Mg Tabs (Furosemide) .... Take one tablet once daily 3)  Toprol Xl 100 Mg Xr24h-tab (Metoprolol succinate) .Marland Kitchen.. 1 by mouth once daily 4)  Potassium Chloride Crys Cr 20 Meq Cr-tabs (Potassium chloride crys cr) .... Take 1 tab once daily  pt needs  labs done for future refills 5)  Synthroid 100 Mcg Tabs  (Levothyroxine sodium) .... Take one tablet by mouth every day**labs due now** 6)  Cytomel 5 Mcg Tabs (Liothyronine sodium) .... Take 2 tablets by mouth two times a day 7)  Lipitor 10 Mg Tabs (Atorvastatin calcium) .Marland Kitchen.. 1 by mouth at bedtime. 8)  Diovan 320 Mg Tabs (Valsartan) .Marland Kitchen.. 1 by mouth once daily 9)  Bd Assure Bpm/manual Arm Cuff Misc (Blood pressure monitoring) .... Monitor bp daily 10)  Nasonex 50 Mcg/act Susp (Mometasone furoate) .... 2 sprays each nostril once daily 11)  Claritin 10 Mg Tabs (Loratadine) .Marland Kitchen.. 1 by mouth once daily as needed  Patient Instructions: 1)  Please schedule a follow-up appointment in 2 weeks.

## 2010-11-08 NOTE — Miscellaneous (Signed)
Summary: Appointment Canceled  Appointment status changed to canceled by [employer] on 06/29/2010 3:16 PM.  Cancellation Comments --------------------- echo/murmur/jml  Appointment Information ----------------------- Appt Type:  CARDIOLOGY ANCILLARY VISIT      Date:  Monday, July 02, 2010      Time:  10:30 AM for 60 min   Urgency:  Routine   Made By:  Pearson Grippe  To Visit:  [id number]    Reason:  echo/murmur/jml  Appt Comments ------------- -- 06/29/10 15:16: (CEMR) CANCELED -- echo/murmur/jml -- 06/28/10 15:39: (CEMR) BOOKED -- Routine CARDIOLOGY ANCILLARY VISIT at 07/02/2010 10:30 AM for 60 min echo/murmur/jml

## 2010-11-08 NOTE — Miscellaneous (Signed)
Summary: Appointment Canceled  Appointment status changed to canceled by [employer] on 06/29/2010 4:42 PM.  Cancellation Comments --------------------- echo/murmur/jml  Appointment Information ----------------------- Appt Type:  CARDIOLOGY ANCILLARY VISIT      Date:  Friday, July 06, 2010      Time:  8:30 AM for 60 min   Urgency:  Routine   Made By:  Pearson Grippe  To Visit:  [id number]    Reason:  echo/murmur/jml  Appt Comments ------------- -- 06/29/10 16:42: (CEMR) CANCELED -- echo/murmur/jml -- 06/29/10 15:16: (CEMR) BOOKED -- Routine CARDIOLOGY ANCILLARY VISIT at 07/06/2010 8:30 AM for 60 min echo/murmur/jml

## 2010-11-08 NOTE — Letter (Signed)
Summary: Primary Care Appointment Letter  [facility]  [address] [place], [place] [address]   Phone: [phone]  Fax: [phone]    03/02/2010 MRN: [id number]  [facility] [address] [place], [place]  [address]  Dear Ms. Les Pou,   Your Primary Care Physician Loreen Freud DO has indicated that:    ____x___it is time to schedule an appointment. (blood pressure check)    _______you missed your appointment on______ and need to call and          reschedule.    _______you need to have lab work done.    _______you need to schedule an appointment discuss lab or test results.    _______you need to call to reschedule your appointment that is                       scheduled on _________.     Please call our office as soon as possible. Our phone number is [phone]          _________. Please press option 1. Our office is open 8a-12noon and 1p-5p, Monday through Friday.     Thank you,    G[facility] Primary Care Scheduler

## 2010-11-08 NOTE — Progress Notes (Signed)
Summary: bp f/u   Phone Note Outgoing Call   Call placed by: Army Fossa CMA,  Feb 26, 2010 8:53 AM Reason for Call: Confirm/change Appt Summary of Call: Pt needs appt to follow up on BP.   Follow-up for Phone Call        [email or web address] Barban  Feb 26, 2010 2:04 PM  Additional Follow-up for Phone Call Additional follow up Details #1::        Mailed letter to patient. Additional Follow-up by: Harold Barban,  Mar 02, 2010 8:40 AM

## 2010-11-08 NOTE — Progress Notes (Signed)
Summary: Lab Results   Phone Note Outgoing Call   Call placed by: Army Fossa CMA,  April 02, 2010 11:50 AM Summary of Call: Regarding lab results, LMTCB:  Ideally your LDL (bad cholesterol) should be <_100___, your HDL (good cholesterol) should be >_40_ and your triglycerides should be< 150.  Diet and exercise will increase HDL and decrease the LDL and triglycerides. Read Dr. Danice Goltz book--Eat Drink and Be Healthy.   Start Lipitor 10 mg #30 1 by mouth  at bedtime, 2 refills. Give $4 copay card.  We will recheck labs in _3__ months.  272.4  lipid, hep Signed by Loreen Freud DO on 03/31/2010 at 11:07 AM   Follow-up for Phone Call        Pt is aware. Copay card up front. Army Fossa CMA  April 02, 2010 11:56 AM     New/Updated Medications: LIPITOR 10 MG TABS (ATORVASTATIN CALCIUM) 1 by mouth at bedtime. Prescriptions: LIPITOR 10 MG TABS (ATORVASTATIN CALCIUM) 1 by mouth at bedtime.  #30 x 2   Entered by:   Army Fossa CMA   Authorized by:   Loreen Freud DO   Signed by:   Army Fossa CMA on 04/02/2010   Method used:   Electronically to        [id number]* (retail)       [address]       [place], [place]  [address]       Ph: [phone]       Fax: [phone]   RxID:   [id number]

## 2010-11-08 NOTE — Assessment & Plan Note (Signed)
Summary: BP f/u// KP   Vital Signs:  Patient profile:   55 year old female Weight:      276.0 pounds Temp:     98.0 degrees F oral Pulse rate:   72 / minute Pulse rhythm:   regular BP sitting:   140 / 88  (left arm) Cuff size:   large  Vitals Entered By: Almeta Monas CMA Duncan Dull) (July 20, 2010 11:27 AM) CC: bp check   History of Present Illness: Pt here for bp check.  No complaints.    Current Medications (verified): 1)  Aciphex 20 Mg Tbec (Rabeprazole Sodium) .... Take 1 Tablet By Mouth Once A Day 2)  Furosemide 40 Mg  Tabs (Furosemide) .... Take One Tablet Once Daily 3)  Toprol Xl 100 Mg Xr24h-Tab (Metoprolol Succinate) .Marland Kitchen.. 1 By Mouth Once Daily 4)  Potassium Chloride Crys Cr 20 Meq Cr-Tabs (Potassium Chloride Crys Cr) .... Take 1 Tab Once Daily  Pt Needs  Labs Done For Future Refills 5)  Synthroid 88 Mcg Tabs (Levothyroxine Sodium) .Marland Kitchen.. 1 By Mouth Qd 6)  Cytomel 5 Mcg Tabs (Liothyronine Sodium) .... Take 2 Tablets By Mouth Two Times A Day 7)  Welchol 3.75 Gm Pack (Colesevelam Hcl) .Marland Kitchen.. 1 Pack Dailyas Directed 8)  Exforge 10-320 Mg Tabs (Amlodipine Besylate-Valsartan) .Marland Kitchen.. 1 By Mouth Once Daily 9)  Bd Assure Bpm/manual Arm Cuff  Misc (Blood Pressure Monitoring) .... Monitor Bp Daily 10)  Nasonex 50 Mcg/act Susp (Mometasone Furoate) .... 2 Sprays Each Nostril Once Daily 11)  Claritin 10 Mg Tabs (Loratadine) .Marland Kitchen.. 1 By Mouth Once Daily As Needed  Allergies (verified): 1)  ! Sulfa 2)  ! * Contrast Dye  Past History:  Past medical, surgical, family and social histories (including risk factors) reviewed for relevance to current acute and chronic problems.  Past Medical History: Reviewed history from 06/27/2010 and no changes required. Hypertension Current Problems:  MORBID OBESITY (ICD-278.01) CHEST PAIN UNSPECIFIED (ICD-786.50) FAMILY HISTORY OF CAD FEMALE 1ST DEGREE RELATIVE <60 (ICD-V16.49) HYPERTENSION (ICD-401.9) HYPOTHYROIDISM NOS  (ICD-244.9) Hyperlipidemia  Past Surgical History: Reviewed history from 04/26/2008 and no changes required. ectopic pregnancy  Family History: Reviewed history from 11/17/2007 and no changes required. Family History High cholesterol Family History Hypertension MGF-- DM Family History of CAD Female 1st degree relative <60 --M died at 55 yo  Social History: Reviewed history from 12/10/2007 and no changes required. Occupation: Divorced Former Smoker Alcohol use-yes Drug use-no Regular exercise-yes  Review of Systems      See HPI  Physical Exam  General:  Well-developed,well-nourished,in no acute distress; alert,appropriate and cooperative throughout examination Lungs:  Normal respiratory effort, chest expands symmetrically. Lungs are clear to auscultation, no crackles or wheezes. Heart:  normal rate and 1-2//6 systolic ejection murmur.   Psych:  Cognition and judgment appear intact. Alert and cooperative with normal attention span and concentration. No apparent delusions, illusions, hallucinations   Impression & Recommendations:  Problem # 1:  HYPERTENSION (ICD-401.9)  Her updated medication list for this problem includes:    Furosemide 40 Mg Tabs (Furosemide) .Marland Kitchen... Take one tablet once daily    Toprol Xl 100 Mg Xr24h-tab (Metoprolol succinate) .Marland Kitchen... 1 by mouth once daily    Exforge 10-320 Mg Tabs (Amlodipine besylate-valsartan) .Marland Kitchen... 1 by mouth once daily  BP today: 140/88 Prior BP: 160/100 (06/27/2010)  Labs Reviewed: K+: 4.0 (06/08/2010) Creat: : 0.8 (06/08/2010)   Chol: 210 (06/08/2010)   HDL: 38.80 (06/08/2010)   LDL: 125 (06/16/2009)   TG: 88.0 (06/08/2010)  Complete Medication List: 1)  Aciphex 20 Mg Tbec (Rabeprazole sodium) .... Take 1 tablet by mouth once a day 2)  Furosemide 40 Mg Tabs (Furosemide) .... Take one tablet once daily 3)  Toprol Xl 100 Mg Xr24h-tab (Metoprolol succinate) .Marland Kitchen.. 1 by mouth once daily 4)  Potassium Chloride Crys Cr 20 Meq  Cr-tabs (Potassium chloride crys cr) .... Take 1 tab once daily  pt needs  labs done for future refills 5)  Synthroid 88 Mcg Tabs (Levothyroxine sodium) .Marland Kitchen.. 1 by mouth qd 6)  Cytomel 5 Mcg Tabs (Liothyronine sodium) .... Take 2 tablets by mouth two times a day 7)  Welchol 3.75 Gm Pack (Colesevelam hcl) .Marland Kitchen.. 1 pack dailyas directed 8)  Exforge 10-320 Mg Tabs (Amlodipine besylate-valsartan) .Marland Kitchen.. 1 by mouth once daily 9)  Bd Assure Bpm/manual Arm Cuff Misc (Blood pressure monitoring) .... Monitor bp daily 10)  Nasonex 50 Mcg/act Susp (Mometasone furoate) .... 2 sprays each nostril once daily 11)  Claritin 10 Mg Tabs (Loratadine) .Marland Kitchen.. 1 by mouth once daily as needed  Patient Instructions: 1)  RTO 2 weeks after labs  Appended Document: Orders Update    Clinical Lists Changes  Medications: Rx of EXFORGE 10-320 MG TABS (AMLODIPINE BESYLATE-VALSARTAN) 1 by mouth once daily;  #90 x 3;  Signed;  Entered by: Loreen Freud DO;  Authorized by: Loreen Freud DO;  Method used: Print then Give to Patient    Prescriptions: EXFORGE 10-320 MG TABS (AMLODIPINE BESYLATE-VALSARTAN) 1 by mouth once daily  #90 x 3   Entered and Authorized by:   Loreen Freud DO   Signed by:   Loreen Freud DO on 07/20/2010   Method used:   Print then Give to Patient   RxID:   [id number]

## 2010-11-08 NOTE — Assessment & Plan Note (Signed)
Summary: bp check/cbs   Vital Signs:  Patient profile:   55 year old female Height:      66 inches Weight:      273 pounds Temp:     98.3 degrees F oral Pulse rate:   76 / minute BP sitting:   160 / 100  (left arm)  Vitals Entered By: Jeremy Johann CMA (May 25, 2010 3:10 PM) CC: bp check   History of Present Illness:  Hypertension follow-up      This is a 55 year old woman who presents for Hypertension follow-up.  The patient denies lightheadedness, urinary frequency, headaches, edema, impotence, rash, and fatigue.  The patient denies the following associated symptoms: chest pain, chest pressure, exercise intolerance, dyspnea, palpitations, syncope, leg edema, and pedal edema.  Compliance with medications (by patient report) has been near 100%.  The patient reports that dietary compliance has been good.  The patient reports exercising 3-4X per week.  Adjunctive measures currently used by the patient include salt restriction.    Current Medications (verified): 1)  Aciphex 20 Mg Tbec (Rabeprazole Sodium) .... Take 1 Tablet By Mouth Once A Day 2)  Furosemide 40 Mg  Tabs (Furosemide) .... Take One Tablet Once Daily 3)  Toprol Xl 100 Mg Xr24h-Tab (Metoprolol Succinate) .Marland Kitchen.. 1 By Mouth Once Daily 4)  Potassium Chloride Crys Cr 20 Meq Cr-Tabs (Potassium Chloride Crys Cr) .... Take 1 Tab Once Daily  Pt Needs  Labs Done For Future Refills 5)  Synthroid 100 Mcg Tabs (Levothyroxine Sodium) .... Take One Tablet By Mouth Every Day**labs Due Now** 6)  Cytomel 5 Mcg Tabs (Liothyronine Sodium) .... Take 2 Tablets By Mouth Two Times A Day 7)  Lipitor 10 Mg Tabs (Atorvastatin Calcium) .Marland Kitchen.. 1 By Mouth At Bedtime. 8)  Diovan 160 Mg Tabs (Valsartan) .Marland Kitchen.. 1 By Mouth Once Daily 9)  Bd Assure Bpm/manual Arm Cuff  Misc (Blood Pressure Monitoring) .... Monitor Bp Daily  Allergies (verified): 1)  ! Sulfa 2)  ! * Contrast Dye  Past History:  Past medical, surgical, family and social histories  (including risk factors) reviewed for relevance to current acute and chronic problems.  Past Medical History: Reviewed history from 04/26/2008 and no changes required. Hypertension Current Problems:  MORBID OBESITY (ICD-278.01) CHEST PAIN UNSPECIFIED (ICD-786.50) FAMILY HISTORY OF CAD FEMALE 1ST DEGREE RELATIVE <60 (ICD-V16.49) HYPERTENSION (ICD-401.9) HYPOTHYROIDISM NOS (ICD-244.9)  Past Surgical History: Reviewed history from 04/26/2008 and no changes required. ectopic pregnancy  Family History: Reviewed history from 11/17/2007 and no changes required. Family History High cholesterol Family History Hypertension MGF-- DM Family History of CAD Female 1st degree relative <60 --M died at 55 yo  Social History: Reviewed history from 12/10/2007 and no changes required. Occupation: Divorced Former Smoker Alcohol use-yes Drug use-no Regular exercise-yes  Review of Systems      See HPI  Physical Exam  General:  Well-developed,well-nourished,in no acute distress; alert,appropriate and cooperative throughout examination Neck:  No deformities, masses, or tenderness noted. Lungs:  Normal respiratory effort, chest expands symmetrically. Lungs are clear to auscultation, no crackles or wheezes. Heart:  normal rate and no murmur.   Extremities:  1+ left pedal edema and 1+ right pedal edema.   Skin:  Intact without suspicious lesions or rashes Psych:  Cognition and judgment appear intact. Alert and cooperative with normal attention span and concentration. No apparent delusions, illusions, hallucinations   Impression & Recommendations:  Problem # 1:  HYPERTENSION (ICD-401.9)  Her updated medication list for this problem includes:  Furosemide 40 Mg Tabs (Furosemide) .Marland Kitchen... Take one tablet once daily    Toprol Xl 100 Mg Xr24h-tab (Metoprolol succinate) .Marland Kitchen... 1 by mouth once daily    Diovan 160 Mg Tabs (Valsartan) .Marland Kitchen... 1 by mouth once daily  BP today: 160/100 Prior BP: 160/100  (04/17/2010)  Labs Reviewed: K+: 4.1 (03/30/2010) Creat: : 0.8 (03/30/2010)   Chol: 227 (03/30/2010)   HDL: 39.80 (03/30/2010)   LDL: 125 (06/16/2009)   TG: 117.0 (03/30/2010)  Problem # 2:  EDEMA (ICD-782.3)  Her updated medication list for this problem includes:    Furosemide 40 Mg Tabs (Furosemide) .Marland Kitchen... Take one tablet once daily  Discussed elevation of the legs, use of compression stockings, sodium restiction, and medication use.   Complete Medication List: 1)  Aciphex 20 Mg Tbec (Rabeprazole sodium) .... Take 1 tablet by mouth once a day 2)  Furosemide 40 Mg Tabs (Furosemide) .... Take one tablet once daily 3)  Toprol Xl 100 Mg Xr24h-tab (Metoprolol succinate) .Marland Kitchen.. 1 by mouth once daily 4)  Potassium Chloride Crys Cr 20 Meq Cr-tabs (Potassium chloride crys cr) .... Take 1 tab once daily  pt needs  labs done for future refills 5)  Synthroid 100 Mcg Tabs (Levothyroxine sodium) .... Take one tablet by mouth every day**labs due now** 6)  Cytomel 5 Mcg Tabs (Liothyronine sodium) .... Take 2 tablets by mouth two times a day 7)  Lipitor 10 Mg Tabs (Atorvastatin calcium) .Marland Kitchen.. 1 by mouth at bedtime. 8)  Diovan 160 Mg Tabs (Valsartan) .Marland Kitchen.. 1 by mouth once daily 9)  Bd Assure Bpm/manual Arm Cuff Misc (Blood pressure monitoring) .... Monitor bp daily  Patient Instructions: 1)  labs next month---272.4  401.9  bmp hep,lipid 2)  Please schedule a follow-up appointment in 2 weeks.  Prescriptions: DIOVAN 160 MG TABS (VALSARTAN) 1 by mouth once daily  #90 x 3   Entered and Authorized by:   Loreen Freud DO   Signed by:   Loreen Freud DO on 05/25/2010   Method used:   Print then Give to Patient   RxID:   [id number] TOPROL XL 100 MG XR24H-TAB (METOPROLOL SUCCINATE) 1 by mouth once daily  #30 x 5   Entered and Authorized by:   Loreen Freud DO   Signed by:   Loreen Freud DO on 05/25/2010   Method used:   Electronically to        [id number]* (retail)       [address]       [place], [place]  [address]       Ph: [phone]       Fax: [phone]   RxID:   [id number]   Appended Document: bp check/cbs BP uncontrolled

## 2010-11-08 NOTE — Assessment & Plan Note (Signed)
Summary: rto med check /cbs   Vital Signs:  Patient profile:   55 year old female Height:      66 inches Weight:      275 pounds Temp:     98.6 degrees F oral Pulse rate:   72 / minute BP sitting:   160 / 100  (left arm)  Vitals Entered By: Jeremy Johann CMA (April 17, 2010 9:48 AM) CC: f/u med Comments REVIEWED MED LIST, PATIENT AGREED DOSE AND INSTRUCTION CORRECT    History of Present Illness:  Hypertension follow-up      This is a 55 year old woman who presents for Hypertension follow-up.  The patient complains of fatigue, but denies lightheadedness, urinary frequency, headaches, edema, impotence, and rash.  The patient denies the following associated symptoms: chest pain, chest pressure, exercise intolerance, dyspnea, palpitations, syncope, leg edema, and pedal edema.  Compliance with medications (by patient report) has been near 100%.  The patient reports that dietary compliance has been good.  Adjunctive measures currently used by the patient include salt restriction.    Current Medications (verified): 1)  Aciphex 20 Mg Tbec (Rabeprazole Sodium) .... Take 1 Tablet By Mouth Once A Day 2)  Furosemide 40 Mg  Tabs (Furosemide) .... Take One Tablet Once Daily 3)  Toprol Xl 100 Mg Xr24h-Tab (Metoprolol Succinate) .Marland Kitchen.. 1 By Mouth Once Daily 4)  Potassium Chloride Crys Cr 20 Meq Cr-Tabs (Potassium Chloride Crys Cr) .... Take 1 Tab Once Daily  Pt Needs  Labs Done For Future Refills 5)  Synthroid 100 Mcg Tabs (Levothyroxine Sodium) .... Take One Tablet By Mouth Every Day**labs Due Now** 6)  Cytomel 5 Mcg Tabs (Liothyronine Sodium) .... Take 2 Tablets By Mouth Two Times A Day 7)  Lipitor 10 Mg Tabs (Atorvastatin Calcium) .Marland Kitchen.. 1 By Mouth At Bedtime. 8)  Lisinopril 20 Mg Tabs (Lisinopril) .Marland Kitchen.. 1 By Mouth Once Daily 9)  Bd Assure Bpm/manual Arm Cuff  Misc (Blood Pressure Monitoring) .... Monitor Bp Daily  Allergies: 1)  ! Sulfa 2)  ! * Contrast Dye  Past History:  Past medical,  surgical, family and social histories (including risk factors) reviewed for relevance to current acute and chronic problems.  Past Medical History: Reviewed history from 04/26/2008 and no changes required. Hypertension Current Problems:  MORBID OBESITY (ICD-278.01) CHEST PAIN UNSPECIFIED (ICD-786.50) FAMILY HISTORY OF CAD FEMALE 1ST DEGREE RELATIVE <60 (ICD-V16.49) HYPERTENSION (ICD-401.9) HYPOTHYROIDISM NOS (ICD-244.9)  Past Surgical History: Reviewed history from 04/26/2008 and no changes required. ectopic pregnancy  Family History: Reviewed history from 11/17/2007 and no changes required. Family History High cholesterol Family History Hypertension MGF-- DM Family History of CAD Female 1st degree relative <60 --M died at 55 yo  Social History: Reviewed history from 12/10/2007 and no changes required. Occupation: Divorced Former Smoker Alcohol use-yes Drug use-no Regular exercise-yes  Review of Systems      See HPI  Physical Exam  General:  Well-developed,well-nourished,in no acute distress; alert,appropriate and cooperative throughout examination Neck:  No deformities, masses, or tenderness noted. Lungs:  Normal respiratory effort, chest expands symmetrically. Lungs are clear to auscultation, no crackles or wheezes. Heart:  Normal rate and regular rhythm. S1 and S2 normal without gallop, murmur, click, rub or other extra sounds. Extremities:  No clubbing, cyanosis, edema, or deformity noted with normal full range of motion of all joints.   Skin:  Intact without suspicious lesions or rashes Cervical Nodes:  No lymphadenopathy noted Psych:  Cognition and judgment appear intact. Alert and cooperative with  normal attention span and concentration. No apparent delusions, illusions, hallucinations   Impression & Recommendations:  Problem # 1:  HYPERTENSION (ICD-401.9)  Her updated medication list for this problem includes:    Furosemide 40 Mg Tabs (Furosemide) .Marland Kitchen...  Take one tablet once daily    Toprol Xl 100 Mg Xr24h-tab (Metoprolol succinate) .Marland Kitchen... 1 by mouth once daily    Lisinopril 20 Mg Tabs (Lisinopril) .Marland Kitchen... 1 by mouth once daily  BP today: 160/100 Prior BP: 170/96 (03/20/2010)  Labs Reviewed: K+: 4.1 (03/30/2010) Creat: : 0.8 (03/30/2010)   Chol: 227 (03/30/2010)   HDL: 39.80 (03/30/2010)   LDL: 125 (06/16/2009)   TG: 117.0 (03/30/2010)  Complete Medication List: 1)  Aciphex 20 Mg Tbec (Rabeprazole sodium) .... Take 1 tablet by mouth once a day 2)  Furosemide 40 Mg Tabs (Furosemide) .... Take one tablet once daily 3)  Toprol Xl 100 Mg Xr24h-tab (Metoprolol succinate) .Marland Kitchen.. 1 by mouth once daily 4)  Potassium Chloride Crys Cr 20 Meq Cr-tabs (Potassium chloride crys cr) .... Take 1 tab once daily  pt needs  labs done for future refills 5)  Synthroid 100 Mcg Tabs (Levothyroxine sodium) .... Take one tablet by mouth every day**labs due now** 6)  Cytomel 5 Mcg Tabs (Liothyronine sodium) .... Take 2 tablets by mouth two times a day 7)  Lipitor 10 Mg Tabs (Atorvastatin calcium) .Marland Kitchen.. 1 by mouth at bedtime. 8)  Lisinopril 20 Mg Tabs (Lisinopril) .Marland Kitchen.. 1 by mouth once daily 9)  Bd Assure Bpm/manual Arm Cuff Misc (Blood pressure monitoring) .... Monitor bp daily Prescriptions: BD ASSURE BPM/MANUAL ARM CUFF  MISC (BLOOD PRESSURE MONITORING) monitor bp daily  #1 x 0   Entered and Authorized by:   Loreen Freud DO   Signed by:   Loreen Freud DO on 04/17/2010   Method used:   Electronically to        [facility] #[id number]* (retail)       [address]       [place], [place]  [address]       Ph: [phone]       Fax: [phone]   RxID:   [id number] LISINOPRIL 20 MG TABS (LISINOPRIL) 1 by mouth once daily  #30 x 2   Entered and Authorized by:   Loreen Freud DO   Signed by:   Loreen Freud DO on 04/17/2010   Method used:   Electronically to        [facility] #[id number]* (retail)       [address]       [place], [place]  [address]       Ph: [phone]       Fax: [phone]   RxID:   [id number]

## 2011-01-19 ENCOUNTER — Other Ambulatory Visit: Payer: Self-pay | Admitting: Family Medicine

## 2011-02-04 ENCOUNTER — Other Ambulatory Visit: Payer: Self-pay | Admitting: Family Medicine

## 2011-02-15 ENCOUNTER — Telehealth: Payer: Self-pay | Admitting: *Deleted

## 2011-02-15 MED ORDER — FUROSEMIDE 40 MG PO TABS: 40.0000 mg | ORAL_TABLET | Freq: Two times a day (BID) | ORAL | Status: DC

## 2011-02-15 NOTE — Telephone Encounter (Signed)
Ok to fill bid--pt needs ov to check K and bp in 2 weeks

## 2011-02-15 NOTE — Telephone Encounter (Signed)
Discuss with patient  

## 2011-02-15 NOTE — Telephone Encounter (Signed)
Pt states that lasix 40 mg was filled for once daily on 01-20-10, however Pt states that she was previously take one tablet twice daily. According to EMR Pt has been on once a day since 03-20-10 when change took place. Pt notes that all other Rx have been for bid so she has been on this regimen with no complaints.Please advise

## 2011-05-19 ENCOUNTER — Other Ambulatory Visit: Payer: Self-pay | Admitting: Family Medicine

## 2011-05-21 ENCOUNTER — Ambulatory Visit: Payer: Self-pay | Admitting: Family Medicine

## 2011-05-27 ENCOUNTER — Encounter: Payer: Self-pay | Admitting: Family Medicine

## 2011-05-27 ENCOUNTER — Ambulatory Visit (INDEPENDENT_AMBULATORY_CARE_PROVIDER_SITE_OTHER): Payer: Self-pay | Admitting: Family Medicine

## 2011-05-27 VITALS — BP 140/92 | HR 80 | Temp 98.3°F | Wt 291.4 lb

## 2011-05-27 DIAGNOSIS — R5383 Other fatigue: Secondary | ICD-10-CM

## 2011-05-27 DIAGNOSIS — I10 Essential (primary) hypertension: Secondary | ICD-10-CM

## 2011-05-27 DIAGNOSIS — E669 Obesity, unspecified: Secondary | ICD-10-CM

## 2011-05-27 MED ORDER — FERROUS SULFATE 325 (65 FE) MG PO TABS: 325.0000 mg | ORAL_TABLET | Freq: Every day | ORAL | Status: DC

## 2011-05-27 NOTE — Patient Instructions (Signed)

## 2011-05-27 NOTE — Progress Notes (Signed)
  Subjective:    Patient here for follow-up of elevated blood pressure.  She is not exercising and is adherent to a low-salt diet.  Blood pressure is not well controlled at home. Cardiac symptoms: none. Patient denies: chest pain, chest pressure/discomfort, claudication, dyspnea, exertional chest pressure/discomfort, fatigue, irregular heart beat, lower extremity edema, near-syncope, orthopnea, palpitations, paroxysmal nocturnal dyspnea, syncope and tachypnea. Cardiovascular risk factors: hypertension, obesity (BMI >= 30 kg/m2) and sedentary lifestyle. Use of agents associated with hypertension: thyroid hormones. History of target organ damage: none.  The following portions of the patient's history were reviewed and updated as appropriate: allergies, current medications, past family history, past medical history, past social history, past surgical history and problem list.  Review of Systems Pertinent items are noted in HPI.     Objective:    BP 140/92  Pulse 80  Temp(Src) 98.3 F (36.8 C) (Oral)  Wt 291 lb 6.4 oz (132.178 kg)  SpO2 97% General appearance: alert, cooperative, appears stated age and no distress Lungs: clear to auscultation bilaterally Heart: S1, S2 normal Extremities: edema b/l    Assessment:    Hypertension, stage 1 . Evidence of target organ damage: none.   fatigue---check labs --maybe secondary to bp meds but pt has been on them for a while Obesity--- refer to nutritionist Plan:    Medication: no change and pt missed a dose of metoprolol. Dietary sodium restriction. Regular aerobic exercise. Follow up: 2 weeks and as needed.  Take lasix 2 a day

## 2011-05-30 ENCOUNTER — Other Ambulatory Visit: Payer: Self-pay | Admitting: Family Medicine

## 2011-05-30 DIAGNOSIS — Z0000 Encounter for general adult medical examination without abnormal findings: Secondary | ICD-10-CM

## 2011-05-31 ENCOUNTER — Other Ambulatory Visit: Payer: Self-pay | Admitting: Family Medicine

## 2011-05-31 ENCOUNTER — Other Ambulatory Visit (INDEPENDENT_AMBULATORY_CARE_PROVIDER_SITE_OTHER): Payer: Self-pay

## 2011-05-31 DIAGNOSIS — I10 Essential (primary) hypertension: Secondary | ICD-10-CM

## 2011-05-31 DIAGNOSIS — R5381 Other malaise: Secondary | ICD-10-CM

## 2011-05-31 DIAGNOSIS — R5383 Other fatigue: Secondary | ICD-10-CM

## 2011-05-31 DIAGNOSIS — E669 Obesity, unspecified: Secondary | ICD-10-CM

## 2011-05-31 LAB — CBC WITH DIFFERENTIAL/PLATELET
Basophils Absolute: 0 K/uL (ref 0.0–0.1)
Basophils Relative: 0.7 % (ref 0.0–3.0)
Eosinophils Absolute: 0.1 K/uL (ref 0.0–0.7)
Eosinophils Relative: 3.7 % (ref 0.0–5.0)
HCT: 39 % (ref 36.0–46.0)
Hemoglobin: 12.9 g/dL (ref 12.0–15.0)
Lymphocytes Relative: 44.1 % (ref 12.0–46.0)
Lymphs Abs: 1.7 K/uL (ref 0.7–4.0)
MCHC: 33.1 g/dL (ref 30.0–36.0)
MCV: 90 fl (ref 78.0–100.0)
Monocytes Absolute: 0.3 K/uL (ref 0.1–1.0)
Monocytes Relative: 8 % (ref 3.0–12.0)
Neutro Abs: 1.7 K/uL (ref 1.4–7.7)
Neutrophils Relative %: 43.5 % (ref 43.0–77.0)
Platelets: 195 K/uL (ref 150.0–400.0)
RBC: 4.33 Mil/uL (ref 3.87–5.11)
RDW: 16.1 % — ABNORMAL HIGH (ref 11.5–14.6)
WBC: 3.8 K/uL — ABNORMAL LOW (ref 4.5–10.5)

## 2011-05-31 LAB — BASIC METABOLIC PANEL WITH GFR
BUN: 12 mg/dL (ref 6–23)
CO2: 29 meq/L (ref 19–32)
Calcium: 8.7 mg/dL (ref 8.4–10.5)
Chloride: 106 meq/L (ref 96–112)
Creatinine, Ser: 0.8 mg/dL (ref 0.4–1.2)
GFR: 103.04 mL/min
Glucose, Bld: 105 mg/dL — ABNORMAL HIGH (ref 70–99)
Potassium: 3.6 meq/L (ref 3.5–5.1)
Sodium: 142 meq/L (ref 135–145)

## 2011-05-31 LAB — LIPID PANEL
Cholesterol: 222 mg/dL — ABNORMAL HIGH (ref 0–200)
HDL: 44.9 mg/dL
Total CHOL/HDL Ratio: 5
Triglycerides: 86 mg/dL (ref 0.0–149.0)
VLDL: 17.2 mg/dL (ref 0.0–40.0)

## 2011-05-31 LAB — HEPATIC FUNCTION PANEL
ALT: 22 U/L (ref 0–35)
Alkaline Phosphatase: 85 U/L (ref 39–117)
Bilirubin, Direct: 0.1 mg/dL (ref 0.0–0.3)
Total Protein: 7.4 g/dL (ref 6.0–8.3)

## 2011-05-31 LAB — LDL CHOLESTEROL, DIRECT: Direct LDL: 184.4 mg/dL

## 2011-05-31 NOTE — Progress Notes (Signed)
Labs only

## 2011-06-18 ENCOUNTER — Other Ambulatory Visit: Payer: Self-pay | Admitting: Family Medicine

## 2011-07-05 ENCOUNTER — Other Ambulatory Visit: Payer: Self-pay | Admitting: Family Medicine

## 2011-08-22 ENCOUNTER — Other Ambulatory Visit: Payer: Self-pay | Admitting: Family Medicine

## 2011-09-25 ENCOUNTER — Other Ambulatory Visit: Payer: Self-pay | Admitting: Family Medicine

## 2011-09-30 ENCOUNTER — Other Ambulatory Visit: Payer: Self-pay | Admitting: Family Medicine

## 2011-11-25 ENCOUNTER — Ambulatory Visit: Payer: Self-pay | Admitting: *Deleted

## 2012-01-06 ENCOUNTER — Ambulatory Visit: Payer: 59

## 2012-01-06 ENCOUNTER — Ambulatory Visit (INDEPENDENT_AMBULATORY_CARE_PROVIDER_SITE_OTHER): Payer: 59 | Admitting: Family Medicine

## 2012-01-06 VITALS — BP 127/73 | HR 56 | Temp 98.1°F | Resp 16 | Ht 65.25 in | Wt 288.2 lb

## 2012-01-06 DIAGNOSIS — M62838 Other muscle spasm: Secondary | ICD-10-CM

## 2012-01-06 DIAGNOSIS — Z043 Encounter for examination and observation following other accident: Secondary | ICD-10-CM

## 2012-01-06 DIAGNOSIS — M542 Cervicalgia: Secondary | ICD-10-CM

## 2012-01-06 MED ORDER — IBUPROFEN 600 MG PO TABS: 600.0000 mg | ORAL_TABLET | Freq: Three times a day (TID) | ORAL | Status: AC | PRN
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
## 2012-01-06 NOTE — Patient Instructions (Signed)
Ibuprofen up to every 6 or 8 hours as needed with food - short course as on blood pressure medication.  Stop if any stomach upset or change in stools.  Neck care manual exercises, heat as needed. Return to the clinic or go to the nearest emergency room if any of your symptoms worsen or new symptoms occur.  You should be improving in the next  5 - 7 days.    Whiplash Whiplash is a soft tissue injury to the neck. It is also called neck sprain or neck strain. It is a collection of symptoms that occur after sudden extension and flexion of the neck, as happens in an automobile crash. Whiplash is not due to a bone fracture, dislocation, or a disc that sticks out (herniated). CAUSES  The disorder commonly occurs as the result of an automobile crash. SYMPTOMS   Neck pain may be present directly after the injury or may be delayed for several days.   In addition to neck pain, other symptoms may include:   Neck stiffness.   Injuries to the muscles and ligaments.   Headache.   Dizziness.   Abnormal sensations such as burning or prickling (paresthesias).   Shoulder or back pain.   Some people experience conditions such as:   Memory loss.   Concentration impairment.   Nervousness.   Irritability.   Sleep disturbances.   Fatigue.   Depression.  TREATMENT  Treatment for individuals with whiplash may include:  Pain medications.   Nonsteroidal anti-inflammatory drugs.   Antidepressants.   Cervical collar.   Range of motion exercises.   Physical therapy.   Supplemental heat application may relieve muscle tension.  LENGTH OF ILLNESS Generally, the prognosis for individuals with whiplash is excellent. The neck and head pain clears within a few days or weeks. Most patients recover within 3 months after the injury. However, some may continue to have lasting neck pain and headaches. Document Released: 07/03/2005 Document Revised: 06/05/2011 Document Reviewed: 03/13/2009 [facility]  Patient Information 2012 [place], [place].

## 2012-05-16 ENCOUNTER — Other Ambulatory Visit: Payer: Self-pay | Admitting: Family Medicine

## 2012-07-14 ENCOUNTER — Telehealth: Payer: Self-pay

## 2012-07-14 MED ORDER — METOPROLOL SUCCINATE ER 100 MG PO TB24: ORAL_TABLET | ORAL | Status: DC

## 2012-07-14 NOTE — Telephone Encounter (Signed)
Pt called back will be in on Friday 10.11.13 at 11am and she stated she will fast til then

## 2012-07-14 NOTE — Telephone Encounter (Signed)
Called pt on home # no answer, per DPR ok to LM on cell called cell LM to call back and sch. F/u BP wt/Fasting labs-smc

## 2012-07-14 NOTE — Telephone Encounter (Signed)
Please offer this patient an apt for a BP follow with fasting labs---   KP

## 2012-07-16 ENCOUNTER — Other Ambulatory Visit: Payer: Self-pay | Admitting: Family Medicine

## 2012-07-17 ENCOUNTER — Ambulatory Visit: Payer: 59 | Admitting: Family Medicine

## 2012-08-21 ENCOUNTER — Other Ambulatory Visit: Payer: Self-pay | Admitting: Family Medicine

## 2012-09-16 ENCOUNTER — Ambulatory Visit (INDEPENDENT_AMBULATORY_CARE_PROVIDER_SITE_OTHER): Payer: 59 | Admitting: Family Medicine

## 2012-09-16 ENCOUNTER — Encounter: Payer: Self-pay | Admitting: Family Medicine

## 2012-09-16 VITALS — BP 114/74 | HR 87 | Temp 98.7°F | Wt 287.4 lb

## 2012-09-16 DIAGNOSIS — E785 Hyperlipidemia, unspecified: Secondary | ICD-10-CM

## 2012-09-16 DIAGNOSIS — I10 Essential (primary) hypertension: Secondary | ICD-10-CM

## 2012-09-16 DIAGNOSIS — H6500 Acute serous otitis media, unspecified ear: Secondary | ICD-10-CM

## 2012-09-16 LAB — POCT URINALYSIS DIPSTICK
Bilirubin, UA: NEGATIVE
Glucose, UA: NEGATIVE
Leukocytes, UA: NEGATIVE
Nitrite, UA: NEGATIVE
pH, UA: 7.5

## 2012-09-16 LAB — BASIC METABOLIC PANEL
Chloride: 100 mEq/L (ref 96–112)
Potassium: 3.6 mEq/L (ref 3.5–5.1)

## 2012-09-16 LAB — HEPATIC FUNCTION PANEL
ALT: 23 U/L (ref 0–35)
AST: 18 U/L (ref 0–37)
Alkaline Phosphatase: 66 U/L (ref 39–117)
Bilirubin, Direct: 0 mg/dL (ref 0.0–0.3)
Total Bilirubin: 0.6 mg/dL (ref 0.3–1.2)

## 2012-09-16 LAB — LIPID PANEL: Total CHOL/HDL Ratio: 6

## 2012-09-16 MED ORDER — MOMETASONE FUROATE 50 MCG/ACT NA SUSP: 2.0000 | Freq: Every day | NASAL | Status: DC

## 2012-09-16 NOTE — Progress Notes (Signed)
  Subjective:    Patient here for follow-up of elevated blood pressure.  She is exercising and is adherent to a low-salt diet.  Blood pressure is well controlled at home. Cardiac symptoms: none. Patient denies: chest pain, chest pressure/discomfort, claudication, dyspnea, exertional chest pressure/discomfort, fatigue, irregular heart beat, lower extremity edema, near-syncope, orthopnea, palpitations, paroxysmal nocturnal dyspnea, syncope and tachypnea. Cardiovascular risk factors: hypertension and obesity (BMI >= 30 kg/m2). Use of agents associated with hypertension: none. History of target organ damage: none. Pt c/o ear pressure R ear.  No other complaints.  The following portions of the patient's history were reviewed and updated as appropriate: allergies, current medications, past family history, past medical history, past social history, past surgical history and problem list.  Review of Systems Pertinent items are noted in HPI.     Objective:    BP 114/74  Pulse 87  Temp 98.7 F (37.1 C) (Oral)  Wt 287 lb 6.4 oz (130.364 kg)  SpO2 98% General appearance: alert, cooperative, appears stated age and no distress Throat: lips, mucosa, and tongue normal; teeth and gums normal Lungs: clear to auscultation bilaterally Heart: S1, S2 normal Extremities: + pitting edema b/l LE  ear--  R ear-- tm dull, + fluid,  L ear normal Assessment:    Hypertension, normal blood pressure . Evidence of target organ damage: none.   hyperlipidemia---check labs--con't diet and exercise Serous otitis-- antihistamine and nasonex Plan:    Medication: no change. Dietary sodium restriction. Regular aerobic exercise. Check blood pressures 2-3 times weekly and record. Follow up: 6 months and as needed.

## 2012-09-16 NOTE — Patient Instructions (Addendum)

## 2012-09-28 ENCOUNTER — Encounter: Payer: Self-pay | Admitting: *Deleted

## 2012-12-01 ENCOUNTER — Other Ambulatory Visit: Payer: Self-pay | Admitting: Family Medicine

## 2013-05-09 ENCOUNTER — Encounter (HOSPITAL_COMMUNITY): Payer: Self-pay | Admitting: Emergency Medicine

## 2013-05-09 ENCOUNTER — Emergency Department (HOSPITAL_COMMUNITY)
Admission: EM | Admit: 2013-05-09 | Discharge: 2013-05-09 | Disposition: A | Discharge status: Home / Self Care | Payer: 59 | Attending: Emergency Medicine | Admitting: Emergency Medicine

## 2013-05-09 DIAGNOSIS — Z79899 Other long term (current) drug therapy: Secondary | ICD-10-CM | POA: Insufficient documentation

## 2013-05-09 DIAGNOSIS — E079 Disorder of thyroid, unspecified: Secondary | ICD-10-CM | POA: Insufficient documentation

## 2013-05-09 DIAGNOSIS — Z8639 Personal history of other endocrine, nutritional and metabolic disease: Secondary | ICD-10-CM | POA: Insufficient documentation

## 2013-05-09 DIAGNOSIS — M545 Low back pain, unspecified: Secondary | ICD-10-CM | POA: Insufficient documentation

## 2013-05-09 DIAGNOSIS — M549 Dorsalgia, unspecified: Secondary | ICD-10-CM

## 2013-05-09 DIAGNOSIS — Z862 Personal history of diseases of the blood and blood-forming organs and certain disorders involving the immune mechanism: Secondary | ICD-10-CM | POA: Insufficient documentation

## 2013-05-09 DIAGNOSIS — Z87891 Personal history of nicotine dependence: Secondary | ICD-10-CM | POA: Insufficient documentation

## 2013-05-09 DIAGNOSIS — I10 Essential (primary) hypertension: Secondary | ICD-10-CM | POA: Insufficient documentation

## 2013-05-09 LAB — URINALYSIS, ROUTINE W REFLEX MICROSCOPIC
Glucose, UA: NEGATIVE mg/dL
Ketones, ur: NEGATIVE mg/dL
Leukocytes, UA: NEGATIVE
Nitrite: NEGATIVE
Protein, ur: NEGATIVE mg/dL
Urobilinogen, UA: 0.2 mg/dL (ref 0.0–1.0)

## 2013-05-09 MED ORDER — OXYCODONE-ACETAMINOPHEN 5-325 MG PO TABS: 2.0000 | ORAL_TABLET | ORAL | Status: DC | PRN

## 2013-05-09 NOTE — ED Provider Notes (Signed)
CSN: [id number]     Arrival date & time 05/09/13  0700 History     First MD Initiated Contact with Patient 05/09/13 0710     Chief Complaint  Patient presents with  . Back Pain   (Consider location/radiation/quality/duration/timing/severity/associated sxs/prior Treatment) HPI Comments:  Patient presents with a two-day history of back pain. The pain is to her right lower back and radiates a little bit to her side. She denies any abdominal pain. She denies any radiation down her legs. She denies any numbness or weakness in her legs. She denies any nausea vomiting or fevers. She states for the last 2 days is woken up out of her sleep in the morning. She was seen 2 days ago in urgent care Center and states they checked her urine and her blood counts. She states that her urine looked fine and that they prescribed her an anti-inflammatory medication which he has not started using it. She currently denies any pain. She denies any history of trauma or injury to her back. She denies a history of back problems in the past.  Patient is a 57 y.o. female presenting with back pain.  Back Pain Associated symptoms: no abdominal pain, no chest pain, no fever, no headaches, no numbness and no weakness     Past Medical History  Diagnosis Date  . Hypertension   . Thyroid disease   . Hyperlipidemia    Past Surgical History  Procedure Laterality Date  . Ectopic pregnancy surgery     Family History  Problem Relation Age of Onset  . Hyperlipidemia    . Hypertension    . Diabetes Maternal Grandfather   . Coronary artery disease     History  Substance Use Topics  . Smoking status: Former Games developer  . Smokeless tobacco: Never Used  . Alcohol Use: Yes     Comment: socially, not even on weekly basis   OB History   Grav Para Term Preterm Abortions TAB SAB Ect Mult Living                 Review of Systems  Constitutional: Negative for fever, chills, diaphoresis and fatigue.  HENT: Negative for  congestion, rhinorrhea and sneezing.   Eyes: Negative.   Respiratory: Negative for cough, chest tightness and shortness of breath.   Cardiovascular: Negative for chest pain and leg swelling.  Gastrointestinal: Negative for nausea, vomiting, abdominal pain, diarrhea and blood in stool.  Genitourinary: Positive for flank pain. Negative for frequency, hematuria and difficulty urinating.  Musculoskeletal: Positive for back pain. Negative for arthralgias.  Skin: Negative for rash.  Neurological: Negative for dizziness, speech difficulty, weakness, numbness and headaches.    Allergies  Iodine; Shellfish allergy; and Sulfonamide derivatives  Home Medications   Current Outpatient Rx  Name  Route  Sig  Dispense  Refill  . amLODipine (NORVASC) 10 MG tablet   Oral   Take 10 mg by mouth at bedtime.         Marland Kitchen levothyroxine (SYNTHROID, LEVOTHROID) 125 MCG tablet   Oral   Take 125 mcg by mouth daily before breakfast.         . metFORMIN (GLUCOPHAGE-XR) 500 MG 24 hr tablet   Oral   Take 500 mg by mouth at bedtime.         . metoprolol succinate (TOPROL-XL) 100 MG 24 hr tablet   Oral   Take 50-100 mg by mouth 2 (two) times daily. 0.5 tabs in am, 1 tab in pm         .  valsartan-hydrochlorothiazide (DIOVAN-HCT) 320-25 MG per tablet   Oral   Take 1 tablet by mouth at bedtime.         Marland Kitchen oxyCODONE-acetaminophen (PERCOCET) 5-325 MG per tablet   Oral   Take 2 tablets by mouth every 4 (four) hours as needed for pain.   20 tablet   0    BP 127/72  Pulse 65  Temp(Src) 98.6 F (37 C) (Oral)  SpO2 100% Physical Exam  Constitutional: She is oriented to person, place, and time. She appears well-developed and well-nourished.  HENT:  Head: Normocephalic and atraumatic.  Eyes: Pupils are equal, round, and reactive to light.  Neck: Normal range of motion. Neck supple.  Cardiovascular: Normal rate, regular rhythm and normal heart sounds.   Pulmonary/Chest: Effort normal and breath  sounds normal. No respiratory distress. She has no wheezes. She has no rales. She exhibits no tenderness.  Abdominal: Soft. Bowel sounds are normal. There is no tenderness. There is no rebound and no guarding.  No CVA tenderness  Musculoskeletal: Normal range of motion. She exhibits no edema.  Lymphadenopathy:    She has no cervical adenopathy.  Neurological: She is alert and oriented to person, place, and time.  Skin: Skin is warm and dry. No rash noted.  Psychiatric: She has a normal mood and affect.    ED Course   Procedures (including critical care time)  Results for orders placed during the hospital encounter of 05/09/13  URINALYSIS, ROUTINE W REFLEX MICROSCOPIC      Result Value Range   Color, Urine YELLOW  YELLOW   APPearance CLEAR  CLEAR   Specific Gravity, Urine 1.008  1.005 - 1.030   pH 7.0  5.0 - 8.0   Glucose, UA NEGATIVE  NEGATIVE mg/dL   Hgb urine dipstick NEGATIVE  NEGATIVE   Bilirubin Urine NEGATIVE  NEGATIVE   Ketones, ur NEGATIVE  NEGATIVE mg/dL   Protein, ur NEGATIVE  NEGATIVE mg/dL   Urobilinogen, UA 0.2  0.0 - 1.0 mg/dL   Nitrite NEGATIVE  NEGATIVE   Leukocytes, UA NEGATIVE  NEGATIVE   No results found.   No results found. 1. Back pain     MDM  Patient presents with right lower back pain. She states it usually comes on when she moves a certain way or it comes on during the night. She currently has no pain she has no other symptoms that would be suggestive of gallbladder disease or renal colic. She has no evidence of UTI. Given that she currently has no symptoms I do not feel that further imaging studies are warranted at this point. I encouraged her to take her anti-inflammatory medications that she was prescribed previously which is a 12. I also gave her prescription for Percocet if she needs it in addition to that. I encouraged her to followup with her primary care physician for reevaluation if her symptoms continue.  Rolan Bucco, MD 05/09/13 [phone]

## 2013-05-09 NOTE — ED Notes (Signed)
Pt presents with back pain that started Friday morning.  She states that it hurts more on the right side and gets worse when she moves

## 2013-06-04 ENCOUNTER — Other Ambulatory Visit: Payer: Self-pay | Admitting: Family Medicine

## 2013-08-04 ENCOUNTER — Other Ambulatory Visit: Payer: Self-pay | Admitting: Family Medicine

## 2013-08-04 NOTE — Telephone Encounter (Signed)
Apt pending for 10/2013

## 2013-09-08 LAB — HM MAMMOGRAPHY: HM Mammogram: NEGATIVE

## 2013-09-08 LAB — HM PAP SMEAR

## 2013-10-19 ENCOUNTER — Telehealth: Payer: Self-pay

## 2013-10-19 NOTE — Telephone Encounter (Signed)
Medication and allergies:  Reviewed and updated  90 day supply/mail order: n/a Local pharmacy:  [facility] [address]   Immunizations due:  None, refused Influenza vaccine.   A/P: No changes to personal, family history or past surgical hx PAP- 09/08/13- Normal-Dr. Newton Pigg [facility]- 06/17/2008- Negative [facility]- 09/08/13- Normal Flu-Refused Tdap- 12/14/2003   To Discuss with Provider: None at this time.

## 2013-10-21 ENCOUNTER — Ambulatory Visit (INDEPENDENT_AMBULATORY_CARE_PROVIDER_SITE_OTHER): Payer: 59 | Admitting: Family Medicine

## 2013-10-21 ENCOUNTER — Encounter: Payer: Self-pay | Admitting: Family Medicine

## 2013-10-21 VITALS — BP 126/76 | HR 65 | Temp 98.1°F | Ht 65.0 in | Wt 300.6 lb

## 2013-10-21 DIAGNOSIS — I10 Essential (primary) hypertension: Secondary | ICD-10-CM

## 2013-10-21 DIAGNOSIS — E785 Hyperlipidemia, unspecified: Secondary | ICD-10-CM

## 2013-10-21 DIAGNOSIS — Z0000 Encounter for general adult medical examination without abnormal findings: Secondary | ICD-10-CM

## 2013-10-21 DIAGNOSIS — E039 Hypothyroidism, unspecified: Secondary | ICD-10-CM

## 2013-10-21 LAB — CBC WITH DIFFERENTIAL/PLATELET
BASOS PCT: 0.6 % (ref 0.0–3.0)
Basophils Absolute: 0 10*3/uL (ref 0.0–0.1)
EOS ABS: 0.2 10*3/uL (ref 0.0–0.7)
Eosinophils Relative: 3.3 % (ref 0.0–5.0)
HEMATOCRIT: 39.5 % (ref 36.0–46.0)
HEMOGLOBIN: 13.4 g/dL (ref 12.0–15.0)
LYMPHS PCT: 36.7 % (ref 12.0–46.0)
Lymphs Abs: 1.8 10*3/uL (ref 0.7–4.0)
MCHC: 33.8 g/dL (ref 30.0–36.0)
MCV: 90.4 fl (ref 78.0–100.0)
MONO ABS: 0.3 10*3/uL (ref 0.1–1.0)
Monocytes Relative: 5.9 % (ref 3.0–12.0)
NEUTROS ABS: 2.6 10*3/uL (ref 1.4–7.7)
Neutrophils Relative %: 53.5 % (ref 43.0–77.0)
Platelets: 210 10*3/uL (ref 150.0–400.0)
RBC: 4.37 Mil/uL (ref 3.87–5.11)
RDW: 15.8 % — ABNORMAL HIGH (ref 11.5–14.6)
WBC: 4.8 10*3/uL (ref 4.5–10.5)

## 2013-10-21 LAB — POCT URINALYSIS DIPSTICK
BILIRUBIN UA: NEGATIVE
Blood, UA: NEGATIVE
Glucose, UA: NEGATIVE
KETONES UA: NEGATIVE
LEUKOCYTES UA: NEGATIVE
Nitrite, UA: NEGATIVE
PH UA: 6.5
Protein, UA: NEGATIVE
Spec Grav, UA: <=1.005
Urobilinogen, UA: 0.2

## 2013-10-21 LAB — LIPID PANEL
Cholesterol: 170 mg/dL (ref 0–200)
HDL: 45.1 mg/dL (ref >39.00)
LDL Cholesterol: 105 mg/dL — ABNORMAL HIGH (ref 0–99)
Total CHOL/HDL Ratio: 4
Triglycerides: 101 mg/dL (ref 0.0–149.0)
VLDL: 20.2 mg/dL (ref 0.0–40.0)

## 2013-10-21 LAB — BASIC METABOLIC PANEL
BUN: 15 mg/dL (ref 6–23)
CHLORIDE: 101 meq/L (ref 96–112)
CO2: 31 meq/L (ref 19–32)
Calcium: 8.9 mg/dL (ref 8.4–10.5)
Creatinine, Ser: 0.8 mg/dL (ref 0.4–1.2)
GFR: 89.64 mL/min (ref >60.00)
Glucose, Bld: 93 mg/dL (ref 70–99)
POTASSIUM: 3.1 meq/L — AB (ref 3.5–5.1)
SODIUM: 139 meq/L (ref 135–145)

## 2013-10-21 LAB — HEPATIC FUNCTION PANEL
ALBUMIN: 4 g/dL (ref 3.5–5.2)
ALK PHOS: 64 U/L (ref 39–117)
ALT: 21 U/L (ref 0–35)
AST: 19 U/L (ref 0–37)
Bilirubin, Direct: 0 mg/dL (ref 0.0–0.3)
TOTAL PROTEIN: 7.9 g/dL (ref 6.0–8.3)
Total Bilirubin: 0.6 mg/dL (ref 0.3–1.2)

## 2013-10-21 NOTE — Progress Notes (Signed)
Pre visit review using our clinic review tool, if applicable. No additional management support is needed unless otherwise documented below in the visit note. 

## 2013-10-21 NOTE — Assessment & Plan Note (Signed)
Per endo °

## 2013-10-21 NOTE — Assessment & Plan Note (Signed)
Check labs con't meds 

## 2013-10-21 NOTE — Progress Notes (Signed)
Subjective:     Tracey Page is a 58 y.o. female and is here for a comprehensive physical exam. The patient reports problems - seeing Dr Chalmers Cater for thyroid.  History   Social History  . Marital Status: Divorced    Spouse Name: N/A    Number of Children: N/A  . Years of Education: N/A   Occupational History  . Not on file.   Social History Main Topics  . Smoking status: Former Research scientist (life sciences)  . Smokeless tobacco: Never Used  . Alcohol Use: Yes     Comment: socially, not even on weekly basis  . Drug Use: No  . Sexual Activity: Yes    Birth Control/ Protection: Post-menopausal   Other Topics Concern  . Not on file   Social History Narrative  . No narrative on file   Health Maintenance  Topic Date Due  . Influenza Vaccine  10/19/2014  . Tetanus/tdap  12/13/2013  . Mammogram  09/08/2014  . Pap Smear  09/08/2016  . Colonoscopy  06/17/2018    The following portions of the patient's history were reviewed and updated as appropriate:  She  has a past medical history of Hypertension; Thyroid disease; and Hyperlipidemia. She  does not have any pertinent problems on file. She  has past surgical history that includes Ectopic pregnancy surgery. Her family history includes Coronary artery disease in an other family member; Diabetes in her maternal grandfather; Hyperlipidemia in an other family member; Hypertension in an other family member. She  reports that she has quit smoking. She has never used smokeless tobacco. She reports that she drinks alcohol. She reports that she does not use illicit drugs. She has a current medication list which includes the following prescription(s): amlodipine, levothyroxine, metoprolol succinate, simvastatin, and valsartan-hydrochlorothiazide. Current Outpatient Prescriptions on File Prior to Visit  Medication Sig Dispense Refill  . amLODipine (NORVASC) 10 MG tablet Take 10 mg by mouth at bedtime.      Marland Kitchen levothyroxine (SYNTHROID, LEVOTHROID) 125 MCG tablet  Take 125 mcg by mouth daily before breakfast.      . metoprolol succinate (TOPROL-XL) 100 MG 24 hr tablet TAKE 1/2 TABLET BY MOUTH EVERY MORNING AND TAKE 1 TABLET BY MOUTH EVERY EVENING  45 tablet  2  . simvastatin (ZOCOR) 40 MG tablet Take 40 mg by mouth daily.      . valsartan-hydrochlorothiazide (DIOVAN-HCT) 320-25 MG per tablet Take 1 tablet by mouth daily.        No current facility-administered medications on file prior to visit.   She is allergic to iodine; shellfish allergy; and sulfonamide derivatives..  Review of Systems Review of Systems  Constitutional: Negative for activity change, appetite change and fatigue.  HENT: Negative for hearing loss, congestion, tinnitus and ear discharge.  dentist q28m Eyes: Negative for visual disturbance (see optho q1y -- vision corrected to 20/20 with glasses).  Respiratory: Negative for cough, chest tightness and shortness of breath.   Cardiovascular: Negative for chest pain, palpitations and leg swelling.  Gastrointestinal: Negative for abdominal pain, diarrhea, constipation and abdominal distention.  Genitourinary: Negative for urgency, frequency, decreased urine volume and difficulty urinating.  Musculoskeletal: Negative for back pain, arthralgias and gait problem.  Skin: Negative for color change, pallor and rash.  Neurological: Negative for dizziness, light-headedness, numbness and headaches.  Hematological: Negative for adenopathy. Does not bruise/bleed easily.  Psychiatric/Behavioral: Negative for suicidal ideas, confusion, sleep disturbance, self-injury, dysphoric mood, decreased concentration and agitation.       Objective:    BP  126/76  Pulse 65  Temp(Src) 98.1 F (36.7 C) (Oral)  Ht 5\' 5"  (1.651 m)  Wt 300 lb 9.6 oz (136.351 kg)  BMI 50.02 kg/m2  SpO2 98% General appearance: alert, cooperative, appears stated age and no distress Head: Normocephalic, without obvious abnormality, atraumatic Eyes: negative findings: lids and  lashes normal and pupils equal, round, reactive to light and accomodation Ears: normal TM's and external ear canals both ears Nose: Nares normal. Septum midline. Mucosa normal. No drainage or sinus tenderness. Throat: lips, mucosa, and tongue normal; teeth and gums normal Neck: no adenopathy, no carotid bruit, no JVD, supple, symmetrical, trachea midline and thyroid not enlarged, symmetric, no tenderness/mass/nodules Back: symmetric, no curvature. ROM normal. No CVA tenderness. Lungs: clear to auscultation bilaterally Breasts: gyn Heart: regular rate and rhythm, S1, S2 normal, no murmur, click, rub or gallop Abdomen: soft, non-tender; bowel sounds normal; no masses,  no organomegaly Pelvic: deferred--gyn Extremities: extremities normal, atraumatic, no cyanosis or edema Pulses: 2+ and symmetric Skin: Skin color, texture, turgor normal. No rashes or lesions Lymph nodes: Cervical, supraclavicular, and axillary nodes normal. Neurologic: Alert and oriented X 3, normal strength and tone. Normal symmetric reflexes. Normal coordination and gait Psych--no depression, no anxiety      Assessment:    Healthy female exam.      Plan:    ghm utd Check labs See After Visit Summary for Counseling Recommendations

## 2013-10-21 NOTE — Patient Instructions (Signed)

## 2013-10-21 NOTE — Assessment & Plan Note (Signed)
Stable Cont meds 

## 2013-11-02 MED ORDER — POTASSIUM CHLORIDE ER 10 MEQ PO TBCR: 10.0000 meq | EXTENDED_RELEASE_TABLET | Freq: Every day | ORAL | Status: DC

## 2013-11-10 ENCOUNTER — Telehealth: Payer: Self-pay | Admitting: Family Medicine

## 2013-11-10 NOTE — Telephone Encounter (Signed)
Relevant patient education assigned to patient using Emmi. ° °

## 2013-11-27 ENCOUNTER — Other Ambulatory Visit: Payer: Self-pay | Admitting: Family Medicine

## 2013-12-07 ENCOUNTER — Encounter: Payer: Self-pay | Admitting: Family Medicine

## 2013-12-07 ENCOUNTER — Ambulatory Visit (INDEPENDENT_AMBULATORY_CARE_PROVIDER_SITE_OTHER): Payer: 59 | Admitting: Family Medicine

## 2013-12-07 VITALS — BP 122/70 | HR 67 | Temp 98.3°F | Wt 297.0 lb

## 2013-12-07 DIAGNOSIS — F430 Acute stress reaction: Secondary | ICD-10-CM

## 2013-12-07 DIAGNOSIS — R5381 Other malaise: Secondary | ICD-10-CM

## 2013-12-07 DIAGNOSIS — R5383 Other fatigue: Principal | ICD-10-CM

## 2013-12-07 DIAGNOSIS — N390 Urinary tract infection, site not specified: Secondary | ICD-10-CM

## 2013-12-07 DIAGNOSIS — E039 Hypothyroidism, unspecified: Secondary | ICD-10-CM

## 2013-12-07 LAB — POCT URINALYSIS DIPSTICK
Bilirubin, UA: NEGATIVE
GLUCOSE UA: NEGATIVE
Ketones, UA: NEGATIVE
NITRITE UA: NEGATIVE
Spec Grav, UA: >=1.03
UROBILINOGEN UA: 0.2
pH, UA: 6

## 2013-12-07 NOTE — Patient Instructions (Signed)

## 2013-12-07 NOTE — Assessment & Plan Note (Signed)
Check labs May be cause of some fatigue

## 2013-12-07 NOTE — Progress Notes (Signed)
Patient ID: Tracey Page, female   DOB: [birth year], 58 y.o.   MRN: [id number]   Subjective:    Patient ID: Tracey Page, female    DOB: 04-24-1956, 58 y.o.   MRN: [id number] HPI Pt here c/o increased fatigue and stress at job.  She is afraid she is going to be fired and will be looking for a new job.  Not sleeping well.           Objective:    BP 122/70  Pulse 67  Temp(Src) 98.3 F (36.8 C) (Oral)  Wt 297 lb (134.718 kg)  SpO2 98% General appearance: alert, cooperative, appears stated age, no distress and morbidly obese Throat: lips, mucosa, and tongue normal; teeth and gums normal Neck: no adenopathy, supple, symmetrical, trachea midline and thyroid not enlarged, symmetric, no tenderness/mass/nodules Lungs: clear to auscultation bilaterally Heart: S1, S2 normal Extremities: extremities normal, atraumatic, no cyanosis or edema Psych-- + depression, under a lot of stress        Assessment & Plan:  1. Other malaise and fatigue Stress related?  Check labs - Basic metabolic panel - CBC with Differential - Hepatic function panel - POCT urinalysis dipstick - TSH - Vitamin B12 - T3, free - T4, free  2. Stress reaction Encouraged pt to take some time off work  Relax

## 2013-12-08 LAB — TSH: TSH: 0.36 u[IU]/mL (ref 0.35–5.50)

## 2013-12-08 LAB — HEPATIC FUNCTION PANEL
ALBUMIN: 3.9 g/dL (ref 3.5–5.2)
ALT: 19 U/L (ref 0–35)
AST: 18 U/L (ref 0–37)
Alkaline Phosphatase: 61 U/L (ref 39–117)
Bilirubin, Direct: 0.1 mg/dL (ref 0.0–0.3)
TOTAL PROTEIN: 7.9 g/dL (ref 6.0–8.3)
Total Bilirubin: 0.6 mg/dL (ref 0.3–1.2)

## 2013-12-08 LAB — CBC WITH DIFFERENTIAL/PLATELET
BASOS PCT: 0.5 % (ref 0.0–3.0)
Basophils Absolute: 0 10*3/uL (ref 0.0–0.1)
EOS ABS: 0.1 10*3/uL (ref 0.0–0.7)
Eosinophils Relative: 2.6 % (ref 0.0–5.0)
HEMATOCRIT: 41.2 % (ref 36.0–46.0)
HEMOGLOBIN: 13.6 g/dL (ref 12.0–15.0)
Lymphocytes Relative: 38.3 % (ref 12.0–46.0)
Lymphs Abs: 2 10*3/uL (ref 0.7–4.0)
MCHC: 33 g/dL (ref 30.0–36.0)
MCV: 92.6 fl (ref 78.0–100.0)
MONO ABS: 0.2 10*3/uL (ref 0.1–1.0)
Monocytes Relative: 4.1 % (ref 3.0–12.0)
NEUTROS ABS: 2.9 10*3/uL (ref 1.4–7.7)
Neutrophils Relative %: 54.5 % (ref 43.0–77.0)
Platelets: 216 10*3/uL (ref 150.0–400.0)
RBC: 4.45 Mil/uL (ref 3.87–5.11)
RDW: 14.6 % (ref 11.5–14.6)
WBC: 5.3 10*3/uL (ref 4.5–10.5)

## 2013-12-08 LAB — T3, FREE: T3, Free: 2.7 pg/mL (ref 2.3–4.2)

## 2013-12-08 LAB — URINE CULTURE
Colony Count: NO GROWTH
ORGANISM ID, BACTERIA: NO GROWTH

## 2013-12-08 LAB — BASIC METABOLIC PANEL
BUN: 13 mg/dL (ref 6–23)
CHLORIDE: 102 meq/L (ref 96–112)
CO2: 28 meq/L (ref 19–32)
Calcium: 9.2 mg/dL (ref 8.4–10.5)
Creatinine, Ser: 0.7 mg/dL (ref 0.4–1.2)
GFR: 108.78 mL/min (ref >60.00)
Glucose, Bld: 86 mg/dL (ref 70–99)
Potassium: 3.3 mEq/L — ABNORMAL LOW (ref 3.5–5.1)
SODIUM: 138 meq/L (ref 135–145)

## 2013-12-08 LAB — T4, FREE: Free T4: 1.18 ng/dL (ref 0.60–1.60)

## 2013-12-08 LAB — VITAMIN B12: VITAMIN B 12: 185 pg/mL — AB (ref 211–911)

## 2013-12-13 ENCOUNTER — Other Ambulatory Visit: Payer: Self-pay | Admitting: *Deleted

## 2013-12-13 NOTE — Telephone Encounter (Signed)
Refill Request  Furosemide tab

## 2013-12-17 ENCOUNTER — Ambulatory Visit (INDEPENDENT_AMBULATORY_CARE_PROVIDER_SITE_OTHER): Payer: 59 | Admitting: Psychology

## 2013-12-17 DIAGNOSIS — F331 Major depressive disorder, recurrent, moderate: Secondary | ICD-10-CM

## 2013-12-20 ENCOUNTER — Encounter: Payer: Self-pay | Admitting: Family Medicine

## 2013-12-20 ENCOUNTER — Ambulatory Visit (INDEPENDENT_AMBULATORY_CARE_PROVIDER_SITE_OTHER): Payer: 59 | Admitting: Family Medicine

## 2013-12-20 VITALS — BP 120/80 | HR 80 | Temp 98.2°F | Wt 294.0 lb

## 2013-12-20 DIAGNOSIS — F341 Dysthymic disorder: Secondary | ICD-10-CM

## 2013-12-20 DIAGNOSIS — F418 Other specified anxiety disorders: Secondary | ICD-10-CM

## 2013-12-20 MED ORDER — ESCITALOPRAM OXALATE 20 MG PO TABS: 20.0000 mg | ORAL_TABLET | Freq: Every day | ORAL | Status: DC

## 2013-12-20 MED ORDER — ALPRAZOLAM 0.25 MG PO TABS: 0.2500 mg | ORAL_TABLET | Freq: Two times a day (BID) | ORAL | Status: DC | PRN

## 2013-12-20 NOTE — Patient Instructions (Signed)
lexapro 20 mg 1/2 tab daily for 8 days and then inc to 1 tab Xanax only when needed

## 2013-12-20 NOTE — Progress Notes (Signed)
Patient ID: Tracey Page, female   DOB: 1956-02-13, 58 y.o.   MRN: [id number]   Subjective:    Patient ID: Tracey Page, female    DOB: [birth year], 58 y.o.   MRN: [id number] HPI Pt here f/u from counselor.  She agreed the pt had depression, anxiety and needs treatment.           Objective:    BP 120/80  Pulse 80  Temp(Src) 98.2 F (36.8 C) (Oral)  Wt 294 lb (133.358 kg)  SpO2 98% General appearance: alert, cooperative, appears stated age and no distress Head: Normocephalic, without obvious abnormality, atraumatic Neurologic: Alert and oriented X 3, normal strength and tone. Normal symmetric reflexes. Normal coordination and gait        Assessment & Plan:  1. Depression with anxiety Start meds, rto 1 months or sooner prn F/u counseling - escitalopram (LEXAPRO) 20 MG tablet; Take 1 tablet (20 mg total) by mouth daily.  Dispense: 30 tablet; Refill: 5 - ALPRAZolam (XANAX) 0.25 MG tablet; Take 1 tablet (0.25 mg total) by mouth 2 (two) times daily as needed for anxiety.  Dispense: 30 tablet; Refill: 0

## 2013-12-20 NOTE — Progress Notes (Signed)
Pre visit review using our clinic review tool, if applicable. No additional management support is needed unless otherwise documented below in the visit note. 

## 2013-12-24 ENCOUNTER — Ambulatory Visit (INDEPENDENT_AMBULATORY_CARE_PROVIDER_SITE_OTHER): Payer: 59 | Admitting: Psychology

## 2013-12-24 DIAGNOSIS — F331 Major depressive disorder, recurrent, moderate: Secondary | ICD-10-CM

## 2014-01-03 ENCOUNTER — Ambulatory Visit (INDEPENDENT_AMBULATORY_CARE_PROVIDER_SITE_OTHER): Payer: 59 | Admitting: Psychology

## 2014-01-03 DIAGNOSIS — F331 Major depressive disorder, recurrent, moderate: Secondary | ICD-10-CM

## 2014-01-14 ENCOUNTER — Encounter: Payer: Self-pay | Admitting: Family Medicine

## 2014-01-14 ENCOUNTER — Ambulatory Visit (INDEPENDENT_AMBULATORY_CARE_PROVIDER_SITE_OTHER): Payer: 59 | Admitting: Family Medicine

## 2014-01-14 VITALS — BP 134/88 | HR 57 | Temp 97.6°F | Resp 18 | Wt 296.0 lb

## 2014-01-14 DIAGNOSIS — F418 Other specified anxiety disorders: Secondary | ICD-10-CM

## 2014-01-14 DIAGNOSIS — F341 Dysthymic disorder: Secondary | ICD-10-CM

## 2014-01-14 MED ORDER — ALPRAZOLAM 0.25 MG PO TABS
0.2500 mg | ORAL_TABLET | Freq: Two times a day (BID) | ORAL | Status: DC | PRN
Start: 2014-01-14 — End: 2014-04-20

## 2014-01-14 NOTE — Progress Notes (Signed)
Pre-visit discussion using our clinic review tool, as applicable. No additional management support is needed unless otherwise documented below in the visit note.  

## 2014-01-14 NOTE — Patient Instructions (Signed)

## 2014-01-14 NOTE — Progress Notes (Signed)
   Subjective:    Patient ID: Tracey Page, female    DOB: 1956-09-15, 58 y.o.   MRN: [id number]  HPI    Review of Systems     Objective:   Physical Exam        Assessment & Plan:    Subjective:    Patient ID: Tracey Page, female    DOB: 18-May-1956, 58 y.o.   MRN: [id number] HPI Pt here f/u depression/ anxiety.  She is doing well with meds and counseling and feels she is making progress.  No new complaints.           Objective:    BP 134/88  Pulse 57  Temp(Src) 97.6 F (36.4 C) (Oral)  Resp 18  Wt 296 lb (134.265 kg)  SpO2 97% General appearance: alert, cooperative, appears stated age and no distress Neurologic: Alert and oriented X 3, normal strength and tone. Normal symmetric reflexes. Normal coordination and gait       Assessment & Plan:  1. Depression with anxiety con't meds - ALPRAZolam (XANAX) 0.25 MG tablet; Take 1 tablet (0.25 mg total) by mouth 2 (two) times daily as needed for anxiety.  Dispense: 45 tablet; Refill: 2

## 2014-01-21 ENCOUNTER — Ambulatory Visit (INDEPENDENT_AMBULATORY_CARE_PROVIDER_SITE_OTHER): Payer: 59 | Admitting: Psychology

## 2014-01-21 DIAGNOSIS — F331 Major depressive disorder, recurrent, moderate: Secondary | ICD-10-CM

## 2014-02-02 ENCOUNTER — Ambulatory Visit (INDEPENDENT_AMBULATORY_CARE_PROVIDER_SITE_OTHER): Payer: 59 | Admitting: Psychology

## 2014-02-02 DIAGNOSIS — F331 Major depressive disorder, recurrent, moderate: Secondary | ICD-10-CM

## 2014-02-17 ENCOUNTER — Telehealth: Payer: Self-pay | Admitting: *Deleted

## 2014-02-17 NOTE — Telephone Encounter (Signed)
Received disability form from patient. Billing sheet attached and placed in folder for Terri. JG//CMA

## 2014-02-18 ENCOUNTER — Ambulatory Visit: Payer: 59 | Admitting: Psychology

## 2014-04-12 ENCOUNTER — Other Ambulatory Visit: Payer: Self-pay

## 2014-04-12 MED ORDER — POTASSIUM CHLORIDE ER 10 MEQ PO TBCR: 10.0000 meq | EXTENDED_RELEASE_TABLET | Freq: Every day | ORAL | Status: DC

## 2014-04-20 ENCOUNTER — Encounter: Payer: Self-pay | Admitting: Lab

## 2014-04-20 ENCOUNTER — Ambulatory Visit (INDEPENDENT_AMBULATORY_CARE_PROVIDER_SITE_OTHER): Payer: 59 | Admitting: Family Medicine

## 2014-04-20 ENCOUNTER — Encounter: Payer: Self-pay | Admitting: Family Medicine

## 2014-04-20 VITALS — BP 120/74 | HR 60 | Temp 98.2°F | Wt 292.0 lb

## 2014-04-20 DIAGNOSIS — E039 Hypothyroidism, unspecified: Secondary | ICD-10-CM

## 2014-04-20 DIAGNOSIS — I10 Essential (primary) hypertension: Secondary | ICD-10-CM

## 2014-04-20 DIAGNOSIS — Z23 Encounter for immunization: Secondary | ICD-10-CM

## 2014-04-20 DIAGNOSIS — F341 Dysthymic disorder: Secondary | ICD-10-CM

## 2014-04-20 DIAGNOSIS — E785 Hyperlipidemia, unspecified: Secondary | ICD-10-CM

## 2014-04-20 DIAGNOSIS — F418 Other specified anxiety disorders: Secondary | ICD-10-CM

## 2014-04-20 LAB — BASIC METABOLIC PANEL
BUN: 10 mg/dL (ref 6–23)
CHLORIDE: 103 meq/L (ref 96–112)
CO2: 30 meq/L (ref 19–32)
CREATININE: 0.8 mg/dL (ref 0.4–1.2)
Calcium: 9.1 mg/dL (ref 8.4–10.5)
GFR: 97.47 mL/min (ref >60.00)
Glucose, Bld: 105 mg/dL — ABNORMAL HIGH (ref 70–99)
POTASSIUM: 3.3 meq/L — AB (ref 3.5–5.1)
Sodium: 139 mEq/L (ref 135–145)

## 2014-04-20 LAB — HEPATIC FUNCTION PANEL
ALT: 17 U/L (ref 0–35)
AST: 17 U/L (ref 0–37)
Albumin: 3.9 g/dL (ref 3.5–5.2)
Alkaline Phosphatase: 62 U/L (ref 39–117)
BILIRUBIN DIRECT: 0 mg/dL (ref 0.0–0.3)
TOTAL PROTEIN: 7.8 g/dL (ref 6.0–8.3)
Total Bilirubin: 0.3 mg/dL (ref 0.2–1.2)

## 2014-04-20 LAB — LIPID PANEL
CHOL/HDL RATIO: 4
Cholesterol: 149 mg/dL (ref 0–200)
HDL: 42.4 mg/dL (ref >39.00)
LDL CALC: 89 mg/dL (ref 0–99)
NONHDL: 106.6
TRIGLYCERIDES: 86 mg/dL (ref 0.0–149.0)
VLDL: 17.2 mg/dL (ref 0.0–40.0)

## 2014-04-20 LAB — POCT URINALYSIS DIPSTICK
Bilirubin, UA: NEGATIVE
Blood, UA: NEGATIVE
Glucose, UA: NEGATIVE
KETONES UA: NEGATIVE
LEUKOCYTES UA: NEGATIVE
NITRITE UA: NEGATIVE
PH UA: 7
PROTEIN UA: NEGATIVE
Spec Grav, UA: 1.01
UROBILINOGEN UA: 0.2

## 2014-04-20 LAB — TSH: TSH: 0.39 u[IU]/mL (ref 0.35–4.50)

## 2014-04-20 MED ORDER — POTASSIUM CHLORIDE ER 10 MEQ PO TBCR: 10.0000 meq | EXTENDED_RELEASE_TABLET | Freq: Every day | ORAL | Status: DC

## 2014-04-20 MED ORDER — VALSARTAN-HYDROCHLOROTHIAZIDE 320-25 MG PO TABS: 1.0000 | ORAL_TABLET | Freq: Every day | ORAL | Status: DC

## 2014-04-20 MED ORDER — ALPRAZOLAM 0.25 MG PO TABS: 0.2500 mg | ORAL_TABLET | Freq: Two times a day (BID) | ORAL | Status: DC | PRN

## 2014-04-20 MED ORDER — METOPROLOL SUCCINATE ER 100 MG PO TB24: ORAL_TABLET | ORAL | Status: DC

## 2014-04-20 MED ORDER — LEVOTHYROXINE SODIUM 125 MCG PO TABS: 125.0000 ug | ORAL_TABLET | Freq: Every day | ORAL | Status: DC

## 2014-04-20 MED ORDER — SIMVASTATIN 40 MG PO TABS: 40.0000 mg | ORAL_TABLET | Freq: Every day | ORAL | Status: DC

## 2014-04-20 MED ORDER — ESCITALOPRAM OXALATE 20 MG PO TABS: 20.0000 mg | ORAL_TABLET | Freq: Every day | ORAL | Status: DC

## 2014-04-20 MED ORDER — AMLODIPINE BESYLATE 10 MG PO TABS: 10.0000 mg | ORAL_TABLET | Freq: Every day | ORAL | Status: DC

## 2014-04-20 NOTE — Patient Instructions (Signed)
Hypertension Hypertension, commonly called high blood pressure, is when the force of blood pumping through your arteries is too strong. Your arteries are the blood vessels that carry blood from your heart throughout your body. A blood pressure reading consists of a higher number over a lower number, such as 110/72. The higher number (systolic) is the pressure inside your arteries when your heart pumps. The lower number (diastolic) is the pressure inside your arteries when your heart relaxes. Ideally you want your blood pressure below 120/80. Hypertension forces your heart to work harder to pump blood. Your arteries may become narrow or stiff. Having hypertension puts you at risk for heart disease, stroke, and other problems.  RISK FACTORS Some risk factors for high blood pressure are controllable. Others are not.  Risk factors you cannot control include:   Race. You may be at higher risk if you are African American.  Age. Risk increases with age.  Gender. Men are at higher risk than women before age 45 years. After age 65, women are at higher risk than men. Risk factors you can control include:  Not getting enough exercise or physical activity.  Being overweight.  Getting too much fat, sugar, calories, or salt in your diet.  Drinking too much alcohol. SIGNS AND SYMPTOMS Hypertension does not usually cause signs or symptoms. Extremely high blood pressure (hypertensive crisis) may cause headache, anxiety, shortness of breath, and nosebleed. DIAGNOSIS  To check if you have hypertension, your health care provider will measure your blood pressure while you are seated, with your arm held at the level of your heart. It should be measured at least twice using the same arm. Certain conditions can cause a difference in blood pressure between your right and left arms. A blood pressure reading that is higher than normal on one occasion does not mean that you need treatment. If one blood pressure reading  is high, ask your health care provider about having it checked again. TREATMENT  Treating high blood pressure includes making lifestyle changes and possibly taking medication. Living a healthy lifestyle can help lower high blood pressure. You may need to change some of your habits. Lifestyle changes may include:  Following the DASH diet. This diet is high in fruits, vegetables, and whole grains. It is low in salt, red meat, and added sugars.  Getting at least 2 1/2 hours of brisk physical activity every week.  Losing weight if necessary.  Not smoking.  Limiting alcoholic beverages.  Learning ways to reduce stress. If lifestyle changes are not enough to get your blood pressure under control, your health care provider may prescribe medicine. You may need to take more than one. Work closely with your health care provider to understand the risks and benefits. HOME CARE INSTRUCTIONS  Have your blood pressure rechecked as directed by your health care provider.   Only take medicine as directed by your health care provider. Follow the directions carefully. Blood pressure medicines must be taken as prescribed. The medicine does not work as well when you skip doses. Skipping doses also puts you at risk for problems.   Do not smoke.   Monitor your blood pressure at home as directed by your health care provider. SEEK MEDICAL CARE IF:   You think you are having a reaction to medicines taken.  You have recurrent headaches or feel dizzy.  You have swelling in your ankles.  You have trouble with your vision. SEEK IMMEDIATE MEDICAL CARE IF:  You develop a severe headache or   confusion.  You have unusual weakness, numbness, or feel faint.  You have severe chest or abdominal pain.  You vomit repeatedly.  You have trouble breathing. MAKE SURE YOU:   Understand these instructions.  Will watch your condition.  Will get help right away if you are not doing well or get  worse. Document Released: 09/23/2005 Document Revised: 09/28/2013 Document Reviewed: 07/16/2013 ExitCare Patient Information 2015 [employer]. This information is not intended to replace advice given to you by your health care provider. Make sure you discuss any questions you have with your health care provider.  

## 2014-04-20 NOTE — Progress Notes (Signed)
  Subjective:    Patient here for follow-up of elevated blood pressure.  She is exercising and is adherent to a low-salt diet.  Blood pressure is well controlled at home. Cardiac symptoms: none. Patient denies: chest pain, chest pressure/discomfort, claudication, dyspnea, exertional chest pressure/discomfort, fatigue, irregular heart beat, lower extremity edema, near-syncope, orthopnea, palpitations, paroxysmal nocturnal dyspnea, syncope and tachypnea. Cardiovascular risk factors: dyslipidemia, hypertension and obesity (BMI >= 30 kg/m2). Use of agents associated with hypertension: none. History of target organ damage: none.  The following portions of the patient's history were reviewed and updated as appropriate: allergies, current medications, past family history, past medical history, past social history, past surgical history and problem list.  Review of Systems Pertinent items are noted in HPI.     Objective:    BP 120/74  Pulse 60  Temp(Src) 98.2 F (36.8 C) (Oral)  Wt 292 lb (132.45 kg)  SpO2 98% General appearance: alert, cooperative, appears stated age and no distress Nose: Nares normal. Septum midline. Mucosa normal. No drainage or sinus tenderness. Throat: lips, mucosa, and tongue normal; teeth and gums normal Neck: no adenopathy, no carotid bruit, no JVD, supple, symmetrical, trachea midline and thyroid not enlarged, symmetric, no tenderness/mass/nodules Lungs: clear to auscultation bilaterally Heart: S1, S2 normal Extremities: edema slightly better     Assessment:    Hypertension, normal blood pressure . Evidence of target organ damage: none.    Plan:    Medication: no change. Dietary sodium restriction. Regular aerobic exercise. Follow up: 6 months and as needed.   1. Other and unspecified hyperlipidemia Check labs - Hepatic function panel - Lipid panel - POCT urinalysis dipstick - simvastatin (ZOCOR) 40 MG tablet; Take 1 tablet (40 mg total) by mouth daily.   Dispense: 90 tablet; Refill: 3  2. Unspecified hypothyroidism Check labs - TSH - levothyroxine (SYNTHROID, LEVOTHROID) 125 MCG tablet; Take 1 tablet (125 mcg total) by mouth daily before breakfast.  Dispense: 90 tablet; Refill: 3  3. Essential hypertension Stable, check labs - Basic metabolic panel - POCT urinalysis dipstick - valsartan-hydrochlorothiazide (DIOVAN-HCT) 320-25 MG per tablet; Take 1 tablet by mouth daily.  Dispense: 90 tablet; Refill: 3 - potassium chloride (K-DUR) 10 MEQ tablet; Take 1 tablet (10 mEq total) by mouth daily.  Dispense: 90 tablet; Refill: 0 - metoprolol succinate (TOPROL-XL) 100 MG 24 hr tablet; TAKE 1/2 TABLET BY MOUTH EVERY MORNING AND TAKE 1 TABLET BY MOUTH EVERY EVENING  Dispense: 45 tablet; Refill: 5 - amLODipine (NORVASC) 10 MG tablet; Take 1 tablet (10 mg total) by mouth at bedtime.  Dispense: 90 tablet; Refill: 3  4. Depression with anxiety con't meds - ALPRAZolam (XANAX) 0.25 MG tablet; Take 1 tablet (0.25 mg total) by mouth 2 (two) times daily as needed for anxiety.  Dispense: 45 tablet; Refill: 2 - escitalopram (LEXAPRO) 20 MG tablet; Take 1 tablet (20 mg total) by mouth daily.  Dispense: 30 tablet; Refill: 5  5. Need for diphtheria-tetanus-pertussis (Tdap) vaccine, adult/adolescent   - Tdap vaccine greater than or equal to 7yo IM

## 2014-04-20 NOTE — Progress Notes (Signed)
Pre visit review using our clinic review tool, if applicable. No additional management support is needed unless otherwise documented below in the visit note. 

## 2014-04-25 ENCOUNTER — Encounter: Payer: Self-pay | Admitting: General Practice

## 2014-04-26 ENCOUNTER — Telehealth: Payer: Self-pay | Admitting: Family Medicine

## 2014-04-26 NOTE — Telephone Encounter (Signed)
Caller name: Kateline. Relation to KD:[id number] Call back [place]:  Reason for call: Patient called stating that Dr Etter Sjogren was suppose to write a letter regarding her health for disability purposes. Also she states that the letter is suppose to be faxed to [facility] fax number [phone].  Please advise.

## 2014-04-29 NOTE — Telephone Encounter (Signed)
Spoke with patient and she se stated she spoke with Dr.Lowne and her disability claim need more documented support of her Anxiety and Stress and what it can do to her overall health. She stated that a written letter would be sufficient, also documented her current health problems and how the Stress and Anxiety can affect these health problems. The patient said she has Graves disease. Please advise      KP

## 2014-04-29 NOTE — Telephone Encounter (Signed)
Will need to wait for PCP to further support her claim

## 2014-05-04 NOTE — Telephone Encounter (Signed)
We can do a letter to say we/ve been seeing her for anxiety-----I don't see doc that we knew she had Graves----it would be helpful to have documentation from counselor--- and to go to counselor if this is getting more complicated

## 2014-05-06 NOTE — Telephone Encounter (Signed)
States that anxiety has gotten better.  Letter needed to support why patient was taken out of work from December 09, 2013-Feb 20, 2014.  Work was stressful. Caused anxiety/depression.  Was referred to [facility], Social worker.   Letter should also explain how stress relates to hypothyroidism and hypertension.   Letter due by Tuesday, May 10, 2014.

## 2014-05-06 NOTE — Telephone Encounter (Signed)
Left a message for call back.  

## 2014-05-09 NOTE — Telephone Encounter (Signed)
Letter printed and signed by Dr. Etter Sjogren.  Pt is aware.  Letter faxed to [facility] fax number [phone] as verified and requested by the patient.  Fax confirmation received.

## 2014-05-25 ENCOUNTER — Telehealth: Payer: Self-pay

## 2014-05-25 ENCOUNTER — Telehealth: Payer: Self-pay | Admitting: Family Medicine

## 2014-05-25 NOTE — Telephone Encounter (Signed)
Caller name:Amanda Barr Relation to HB:[id number] to Dr Rater Call back number:[phone] Pharmacy:  Reason for call: Estill Bamberg called and was requesting that Dr Etter Sjogren call Dr Rater  back at her convenience to discuss [facility] and her being out of work from March 17 until May 4

## 2014-05-25 NOTE — Telephone Encounter (Signed)
LM on voice mail for assistant

## 2014-05-25 NOTE — Telephone Encounter (Signed)
I already spoke with a Dr cooper today about the pt---- Tracey Page please call for me

## 2014-05-25 NOTE — Telephone Encounter (Signed)
Caller name: Scherrie Bateman Call back number: [phone] Reason for call: pt returning your call

## 2014-05-25 NOTE — Telephone Encounter (Signed)
To MD.    KP 

## 2014-05-26 NOTE — Telephone Encounter (Signed)
See additional phone note from 05/25/2014. LM on voice mail

## 2014-05-27 NOTE — Telephone Encounter (Signed)
Dr Etter Sjogren spoke with Dr Burt Knack.

## 2014-09-22 ENCOUNTER — Other Ambulatory Visit: Payer: Self-pay | Admitting: Family Medicine

## 2014-09-22 MED ORDER — RABEPRAZOLE SODIUM 20 MG PO TBEC: 20.0000 mg | DELAYED_RELEASE_TABLET | Freq: Every day | ORAL | Status: DC

## 2014-09-22 NOTE — Telephone Encounter (Signed)
Caller name:Gruenwald Sibley Relation to OZ:DGUY Call back number:[phone] Pharmacy:[facility] [address]  Reason for call: pt needs rx acithex, pt states her acid reflux is bothering her and she now needs something for it. Pt states to let her know if this med will interfere with her other meds if so other options does she have.

## 2014-09-22 NOTE — Telephone Encounter (Addendum)
Patient calling and requesting a refill for Aciphex, she said she had taken it in the past. She said she stopped because she changed her diet at the time. She was taking Aciphex 20 mg. Please advise if it is ok to fill the Rx.      KP

## 2014-10-18 ENCOUNTER — Telehealth: Payer: Self-pay | Admitting: Family Medicine

## 2014-10-18 DIAGNOSIS — I10 Essential (primary) hypertension: Secondary | ICD-10-CM

## 2014-10-18 MED ORDER — AMLODIPINE BESYLATE 10 MG PO TABS: 10.0000 mg | ORAL_TABLET | Freq: Every day | ORAL | Status: DC

## 2014-10-18 NOTE — Telephone Encounter (Signed)
Caller name: [name], [name] Relation to pt: self  Call back number: [phone] Pharmacy:   [facility] [place], [place]. [phone] (Phone     Reason for call: pt requesting refill amLODipine (NORVASC) 10 MG tablet

## 2014-10-18 NOTE — Telephone Encounter (Signed)
Rx faxed.    KP 

## 2014-10-24 ENCOUNTER — Ambulatory Visit: Payer: Self-pay | Admitting: Family Medicine

## 2014-11-15 ENCOUNTER — Ambulatory Visit: Payer: Self-pay | Admitting: Family Medicine

## 2015-01-06 ENCOUNTER — Encounter: Payer: Self-pay | Admitting: Family Medicine

## 2015-01-06 ENCOUNTER — Ambulatory Visit (INDEPENDENT_AMBULATORY_CARE_PROVIDER_SITE_OTHER): Payer: Self-pay | Admitting: Family Medicine

## 2015-01-06 VITALS — BP 126/78 | HR 63 | Temp 98.1°F | Wt 298.0 lb

## 2015-01-06 DIAGNOSIS — I10 Essential (primary) hypertension: Secondary | ICD-10-CM

## 2015-01-06 DIAGNOSIS — F418 Other specified anxiety disorders: Secondary | ICD-10-CM

## 2015-01-06 DIAGNOSIS — R609 Edema, unspecified: Secondary | ICD-10-CM

## 2015-01-06 DIAGNOSIS — K219 Gastro-esophageal reflux disease without esophagitis: Secondary | ICD-10-CM

## 2015-01-06 DIAGNOSIS — E039 Hypothyroidism, unspecified: Secondary | ICD-10-CM

## 2015-01-06 DIAGNOSIS — E785 Hyperlipidemia, unspecified: Secondary | ICD-10-CM

## 2015-01-06 LAB — BASIC METABOLIC PANEL
BUN: 11 mg/dL (ref 6–23)
CHLORIDE: 101 meq/L (ref 96–112)
CO2: 32 mEq/L (ref 19–32)
Calcium: 9.4 mg/dL (ref 8.4–10.5)
Creatinine, Ser: 0.79 mg/dL (ref 0.40–1.20)
GFR: 95.81 mL/min (ref >60.00)
Glucose, Bld: 104 mg/dL — ABNORMAL HIGH (ref 70–99)
POTASSIUM: 3.6 meq/L (ref 3.5–5.1)
SODIUM: 138 meq/L (ref 135–145)

## 2015-01-06 LAB — THYROID PANEL WITH TSH
FREE THYROXINE INDEX: 1.4 (ref 1.4–3.8)
T3 UPTAKE: 24 % (ref 22–35)
T4, Total: 6 ug/dL (ref 4.5–12.0)
TSH: 11.728 u[IU]/mL — AB (ref 0.350–4.500)

## 2015-01-06 LAB — HEPATIC FUNCTION PANEL
ALBUMIN: 3.9 g/dL (ref 3.5–5.2)
ALT: 20 U/L (ref 0–35)
AST: 18 U/L (ref 0–37)
Alkaline Phosphatase: 61 U/L (ref 39–117)
Bilirubin, Direct: 0.1 mg/dL (ref 0.0–0.3)
Total Bilirubin: 0.4 mg/dL (ref 0.2–1.2)
Total Protein: 7.7 g/dL (ref 6.0–8.3)

## 2015-01-06 LAB — LIPID PANEL
CHOL/HDL RATIO: 5
Cholesterol: 234 mg/dL — ABNORMAL HIGH (ref 0–200)
HDL: 42.8 mg/dL (ref >39.00)
LDL Cholesterol: 168 mg/dL — ABNORMAL HIGH (ref 0–99)
NonHDL: 191.2
Triglycerides: 118 mg/dL (ref 0.0–149.0)
VLDL: 23.6 mg/dL (ref 0.0–40.0)

## 2015-01-06 MED ORDER — ESCITALOPRAM OXALATE 20 MG PO TABS
20.0000 mg | ORAL_TABLET | Freq: Every day | ORAL | Status: DC
Start: 2015-01-06 — End: 2015-07-14

## 2015-01-06 MED ORDER — SIMVASTATIN 40 MG PO TABS: 40.0000 mg | ORAL_TABLET | Freq: Every day | ORAL | Status: DC

## 2015-01-06 MED ORDER — ALPRAZOLAM 0.25 MG PO TABS: 0.2500 mg | ORAL_TABLET | Freq: Two times a day (BID) | ORAL | Status: DC | PRN

## 2015-01-06 MED ORDER — METOPROLOL SUCCINATE ER 100 MG PO TB24: ORAL_TABLET | ORAL | Status: DC

## 2015-01-06 MED ORDER — POTASSIUM CHLORIDE ER 10 MEQ PO TBCR: 10.0000 meq | EXTENDED_RELEASE_TABLET | Freq: Every day | ORAL | Status: DC

## 2015-01-06 MED ORDER — FUROSEMIDE 20 MG PO TABS: ORAL_TABLET | ORAL | Status: DC

## 2015-01-06 MED ORDER — RABEPRAZOLE SODIUM 20 MG PO TBEC: 20.0000 mg | DELAYED_RELEASE_TABLET | Freq: Every day | ORAL | Status: DC

## 2015-01-06 MED ORDER — ATORVASTATIN CALCIUM 20 MG PO TABS: 20.0000 mg | ORAL_TABLET | Freq: Every day | ORAL | Status: DC

## 2015-01-06 MED ORDER — AMLODIPINE BESYLATE 10 MG PO TABS: 10.0000 mg | ORAL_TABLET | Freq: Every day | ORAL | Status: DC

## 2015-01-06 MED ORDER — LEVOTHYROXINE SODIUM 125 MCG PO TABS: 125.0000 ug | ORAL_TABLET | Freq: Every day | ORAL | Status: DC

## 2015-01-06 MED ORDER — VALSARTAN-HYDROCHLOROTHIAZIDE 320-25 MG PO TABS: 1.0000 | ORAL_TABLET | Freq: Every day | ORAL | Status: DC

## 2015-01-06 MED ORDER — VALSARTAN 320 MG PO TABS: 320.0000 mg | ORAL_TABLET | Freq: Every day | ORAL | Status: DC

## 2015-01-06 NOTE — Progress Notes (Signed)
Pre visit review using our clinic review tool, if applicable. No additional management support is needed unless otherwise documented below in the visit note. 

## 2015-01-06 NOTE — Progress Notes (Signed)
Patient ID: Tracey Page, female    DOB: Aug 01, 1956  Age: 59 y.o. MRN: [id number]    Subjective:  Subjective HPI Tracey Page presents for f/u cholesterol, htn and thyroid.     Review of Systems  Constitutional: Negative for activity change, appetite change, fatigue and unexpected weight change.  Respiratory: Negative for cough and shortness of breath.   Cardiovascular: Negative for chest pain and palpitations.  Psychiatric/Behavioral: Negative for behavioral problems and dysphoric mood. The patient is not nervous/anxious.     History Past Medical History  Diagnosis Date  . Hypertension   . Thyroid disease   . Hyperlipidemia     She has past surgical history that includes Ectopic pregnancy surgery.   Her family history includes Aneurysm in her father; Arthritis in her mother; Coronary artery disease in an other family member; Diabetes in her maternal grandfather; Hyperlipidemia in an other family member; Hypertension in an other family member.She reports that she has quit smoking. She has never used smokeless tobacco. She reports that she drinks alcohol. She reports that she does not use illicit drugs.  No current outpatient prescriptions on file prior to visit.   No current facility-administered medications on file prior to visit.     Objective:  Objective Physical Exam  Constitutional: She is oriented to person, place, and time. She appears well-developed and well-nourished. No distress.  HENT:  Right Ear: External ear normal.  Left Ear: External ear normal.  Nose: Nose normal.  Mouth/Throat: Oropharynx is clear and moist.  Eyes: EOM are normal. Pupils are equal, round, and reactive to light.  Neck: Normal range of motion. Neck supple.  Cardiovascular: Normal rate, regular rhythm and normal heart sounds.   No murmur heard. Pulmonary/Chest: Effort normal and breath sounds normal. No respiratory distress. She has no wheezes. She has no rales. She exhibits no  tenderness.  Musculoskeletal: She exhibits edema. She exhibits no tenderness.  Neurological: She is alert and oriented to person, place, and time.  Psychiatric: She has a normal mood and affect. Her behavior is normal. Judgment and thought content normal.   BP 126/78 mmHg  Pulse 63  Temp(Src) 98.1 F (36.7 C) (Oral)  Wt 298 lb (135.172 kg)  SpO2 97% Wt Readings from Last 3 Encounters:  01/06/15 298 lb (135.172 kg)  04/20/14 292 lb (132.45 kg)  01/14/14 296 lb (134.265 kg)     Lab Results  Component Value Date   WBC 5.3 12/07/2013   HGB 13.6 12/07/2013   HCT 41.2 12/07/2013   PLT 216.0 12/07/2013   GLUCOSE 105* 04/20/2014   CHOL 149 04/20/2014   TRIG 86.0 04/20/2014   HDL 42.40 04/20/2014   LDLDIRECT 179.1 09/16/2012   LDLCALC 89 04/20/2014   ALT 17 04/20/2014   AST 17 04/20/2014   NA 139 04/20/2014   K 3.3* 04/20/2014   CL 103 04/20/2014   CREATININE 0.8 04/20/2014   BUN 10 04/20/2014   CO2 30 04/20/2014   TSH 0.39 04/20/2014    No results found.   Assessment & Plan:  Plan I have discontinued Tracey Page's valsartan-hydrochlorothiazide, simvastatin, simvastatin, and valsartan-hydrochlorothiazide. I am also having her start on valsartan, furosemide, and atorvastatin. Additionally, I am having her maintain her ALPRAZolam, amLODipine, escitalopram, levothyroxine, metoprolol succinate, potassium chloride, and RABEprazole.  Meds ordered this encounter  Medications  . ALPRAZolam (XANAX) 0.25 MG tablet    Sig: Take 1 tablet (0.25 mg total) by mouth 2 (two) times daily as needed for anxiety.  Dispense:  45 tablet    Refill:  2  . amLODipine (NORVASC) 10 MG tablet    Sig: Take 1 tablet (10 mg total) by mouth at bedtime. Office visit due now    Dispense:  90 tablet    Refill:  0  . escitalopram (LEXAPRO) 20 MG tablet    Sig: Take 1 tablet (20 mg total) by mouth daily.    Dispense:  30 tablet    Refill:  5  . levothyroxine (SYNTHROID, LEVOTHROID) 125 MCG tablet     Sig: Take 1 tablet (125 mcg total) by mouth daily before breakfast.    Dispense:  90 tablet    Refill:  3  . metoprolol succinate (TOPROL-XL) 100 MG 24 hr tablet    Sig: TAKE 1/2 TABLET BY MOUTH EVERY MORNING AND TAKE 1 TABLET BY MOUTH EVERY EVENING    Dispense:  45 tablet    Refill:  5  . potassium chloride (K-DUR) 10 MEQ tablet    Sig: Take 1 tablet (10 mEq total) by mouth daily.    Dispense:  90 tablet    Refill:  0  . RABEprazole (ACIPHEX) 20 MG tablet    Sig: Take 1 tablet (20 mg total) by mouth daily.    Dispense:  90 tablet    Refill:  1  . DISCONTD: simvastatin (ZOCOR) 40 MG tablet    Sig: Take 1 tablet (40 mg total) by mouth daily.    Dispense:  90 tablet    Refill:  3  . DISCONTD: valsartan-hydrochlorothiazide (DIOVAN-HCT) 320-25 MG per tablet    Sig: Take 1 tablet by mouth daily.    Dispense:  90 tablet    Refill:  3  . valsartan (DIOVAN) 320 MG tablet    Sig: Take 1 tablet (320 mg total) by mouth daily.    Dispense:  90 tablet    Refill:  3    D/C PREVIOUS SCRIPTS FOR THIS MEDICATION  . furosemide (LASIX) 20 MG tablet    Sig: 1-2 po qd edema    Dispense:  60 tablet    Refill:  3  . atorvastatin (LIPITOR) 20 MG tablet    Sig: Take 1 tablet (20 mg total) by mouth daily.    Dispense:  90 tablet    Refill:  3    Problem List Items Addressed This Visit    EDEMA   Relevant Medications   furosemide (LASIX) tablet   Essential hypertension    Stable Cont norvasc and metoprolol Change diovan hct to diovan and add lasix due to edema       Relevant Medications   amLODIpine (NORVASC) tablet   metoprolol succinate (TOPROL-XL) 24 hr tablet   potassium chloride (K-DUR) CR tablet   valsartan (DIOVAN) tablet   furosemide (LASIX) tablet   atorvastatin (LIPITOR) tablet   Other Relevant Orders   Basic metabolic panel   POCT urinalysis dipstick   Hyperlipidemia    D/c zocor secondary to also being on norvasc Change to lipitor Check labs      Relevant Medications    amLODIpine (NORVASC) tablet   metoprolol succinate (TOPROL-XL) 24 hr tablet   valsartan (DIOVAN) tablet   furosemide (LASIX) tablet   atorvastatin (LIPITOR) tablet    Other Visit Diagnoses    Depression with anxiety    -  Primary    Relevant Medications    ALPRAZolam  (XANAX) tablet    escitalopram (LEXAPRO) tablet    Hypothyroidism, unspecified hypothyroidism type  Relevant Medications    levothyroxine (SYNTHROID, LEVOTHROID) tablet    metoprolol succinate (TOPROL-XL) 24 hr tablet    Other Relevant Orders    Thyroid Panel With TSH    Hyperlipemia        Relevant Medications    amLODIpine (NORVASC) tablet    metoprolol succinate (TOPROL-XL) 24 hr tablet    valsartan (DIOVAN) tablet    furosemide (LASIX) tablet    atorvastatin (LIPITOR) tablet    Other Relevant Orders    POCT urinalysis dipstick    Hepatic function panel    Lipid panel    Gastroesophageal reflux disease without esophagitis        Relevant Medications    RABEprazole (ACIPHEX) 20 MG tablet       Follow-up: Return in about 6 months (around 07/08/2015), or if symptoms worsen or fail to improve, for f/u and labs.  [name], DO

## 2015-01-06 NOTE — Assessment & Plan Note (Signed)
Stable Cont norvasc and metoprolol Change diovan hct to diovan and add lasix due to edema

## 2015-01-06 NOTE — Assessment & Plan Note (Signed)
D/c zocor secondary to also being on norvasc Change to lipitor Check labs

## 2015-01-06 NOTE — Patient Instructions (Signed)

## 2015-01-11 ENCOUNTER — Other Ambulatory Visit: Payer: Self-pay

## 2015-01-11 DIAGNOSIS — E039 Hypothyroidism, unspecified: Secondary | ICD-10-CM

## 2015-01-11 DIAGNOSIS — E785 Hyperlipidemia, unspecified: Secondary | ICD-10-CM

## 2015-02-20 ENCOUNTER — Telehealth: Payer: Self-pay | Admitting: Family Medicine

## 2015-02-20 NOTE — Telephone Encounter (Signed)
Caller name: Frida Relation to pt: self Call back number: [phone] Pharmacy: [facility] [address]  Reason for call:   Patient states that she has cough/sore throat/and head congestion and is wanting to know what OTC medication that she can take that will not interfere with her meds.

## 2015-02-20 NOTE — Telephone Encounter (Signed)
Please advise      KP 

## 2015-02-20 NOTE — Telephone Encounter (Signed)
She can do rhinocort, nasacort or flonase otc and either zyrtec,allegra or claritin otc  Cough-- delsym, muciniex

## 2015-02-20 NOTE — Telephone Encounter (Signed)
Spoke with patient and she verbalized understanding and she stated she will try the Claritin, Flonase and Mucinex. I made her aware to follow up in the office if no improvement.      KP

## 2015-04-11 ENCOUNTER — Telehealth: Payer: Self-pay | Admitting: Family Medicine

## 2015-04-11 NOTE — Telephone Encounter (Signed)
[facility] - Client [facility]  Patient Name: Tracey Page  DOB: [birth year]    Initial Comment Caller states has swelling in legs and painful to touch.    Nurse Assessment  Nurse: Wynetta Emery, RN, Baker Janus Date/Time Eilene Ghazi Time): 04/11/2015 4:27:01 PM  Confirm and document reason for call. If symptomatic, describe symptoms. ---Jaleisa has fallen down a set of steps two weeks ago; swelling in both legs left leg the worse of the two.  Has the patient traveled out of the country within the last 30 days? ---No  Does the patient require triage? ---Yes  Related visit to physician within the last 2 weeks? ---No  Does the PT have any chronic conditions? (i.e. diabetes, asthma, etc.) ---No     Guidelines    Guideline Title Affirmed Question Affirmed Notes  Leg Swelling and Edema [1] MODERATE leg swelling (e.g., swelling extends up to knees) AND [2] new onset or worsening    Final Disposition User   See Physician within [place], [place]    Comments  04-13-2015 100pm [name]

## 2015-04-12 NOTE — Telephone Encounter (Signed)
Noted  

## 2015-04-13 ENCOUNTER — Ambulatory Visit (HOSPITAL_BASED_OUTPATIENT_CLINIC_OR_DEPARTMENT_OTHER)
Admission: RE | Admit: 2015-04-13 | Discharge: 2015-04-13 | Disposition: A | Discharge status: Home / Self Care | Payer: Managed Care, Other (non HMO) | Source: Ambulatory Visit | Attending: Family Medicine | Admitting: Family Medicine

## 2015-04-13 ENCOUNTER — Encounter: Payer: Self-pay | Admitting: Family Medicine

## 2015-04-13 ENCOUNTER — Telehealth: Payer: Self-pay | Admitting: Family Medicine

## 2015-04-13 ENCOUNTER — Ambulatory Visit (INDEPENDENT_AMBULATORY_CARE_PROVIDER_SITE_OTHER): Payer: Managed Care, Other (non HMO) | Admitting: Family Medicine

## 2015-04-13 VITALS — BP 108/70 | HR 63 | Temp 98.5°F | Ht 66.0 in | Wt 292.2 lb

## 2015-04-13 DIAGNOSIS — M25572 Pain in left ankle and joints of left foot: Secondary | ICD-10-CM

## 2015-04-13 DIAGNOSIS — L03116 Cellulitis of left lower limb: Secondary | ICD-10-CM | POA: Diagnosis not present

## 2015-04-13 DIAGNOSIS — E785 Hyperlipidemia, unspecified: Secondary | ICD-10-CM | POA: Diagnosis not present

## 2015-04-13 DIAGNOSIS — M7989 Other specified soft tissue disorders: Secondary | ICD-10-CM | POA: Diagnosis not present

## 2015-04-13 DIAGNOSIS — R609 Edema, unspecified: Secondary | ICD-10-CM | POA: Diagnosis not present

## 2015-04-13 DIAGNOSIS — M25562 Pain in left knee: Secondary | ICD-10-CM

## 2015-04-13 DIAGNOSIS — E039 Hypothyroidism, unspecified: Secondary | ICD-10-CM | POA: Diagnosis not present

## 2015-04-13 LAB — BASIC METABOLIC PANEL
BUN: 11 mg/dL (ref 6–23)
CHLORIDE: 103 meq/L (ref 96–112)
CO2: 29 mEq/L (ref 19–32)
Calcium: 9.4 mg/dL (ref 8.4–10.5)
Creatinine, Ser: 0.76 mg/dL (ref 0.40–1.20)
GFR: 100.1 mL/min (ref >60.00)
GLUCOSE: 88 mg/dL (ref 70–99)
POTASSIUM: 3.7 meq/L (ref 3.5–5.1)
Sodium: 139 mEq/L (ref 135–145)

## 2015-04-13 LAB — LIPID PANEL
Cholesterol: 140 mg/dL (ref 0–200)
HDL: 33.4 mg/dL — ABNORMAL LOW (ref >39.00)
LDL CALC: 96 mg/dL (ref 0–99)
NonHDL: 106.6
TRIGLYCERIDES: 54 mg/dL (ref 0.0–149.0)
Total CHOL/HDL Ratio: 4
VLDL: 10.8 mg/dL (ref 0.0–40.0)

## 2015-04-13 LAB — HEPATIC FUNCTION PANEL
ALK PHOS: 80 U/L (ref 39–117)
ALT: 13 U/L (ref 0–35)
AST: 15 U/L (ref 0–37)
Albumin: 3.9 g/dL (ref 3.5–5.2)
Bilirubin, Direct: 0.2 mg/dL (ref 0.0–0.3)
Total Bilirubin: 0.5 mg/dL (ref 0.2–1.2)
Total Protein: 8.1 g/dL (ref 6.0–8.3)

## 2015-04-13 LAB — TSH: TSH: 1.18 u[IU]/mL (ref 0.35–4.50)

## 2015-04-13 MED ORDER — FUROSEMIDE 20 MG PO TABS: ORAL_TABLET | ORAL | Status: DC

## 2015-04-13 MED ORDER — CEPHALEXIN 500 MG PO CAPS: 500.0000 mg | ORAL_CAPSULE | Freq: Two times a day (BID) | ORAL | Status: DC

## 2015-04-13 NOTE — Telephone Encounter (Signed)
Pt called back again. She works nights and couldn't answer the phone. Please try in the morning.

## 2015-04-13 NOTE — Progress Notes (Signed)
Patient ID: Tracey Page, female    DOB: [birth year]  Age: 59 y.o. MRN: [id number]    Subjective:  Subjective HPI Tracey Page presents for f/u from fall 2 weeks ago.  She jumped in front of her 2 1/2 yo grandson who was about to fall down steps and in doing so she lost her balance and fell down 15 steps.  She slid down backwards on her knees and was going so fast she flipped over.  She c/o L leg pain and swelling -- no calf pain.  No other complaints.   Review of Systems  Constitutional: Negative for activity change, appetite change, fatigue and unexpected weight change.  Respiratory: Negative for cough and shortness of breath.   Cardiovascular: Negative for chest pain and palpitations.  Musculoskeletal: Positive for gait problem.  Neurological: Negative for dizziness, tremors, seizures, syncope, facial asymmetry, speech difficulty, weakness, light-headedness, numbness and headaches.  Psychiatric/Behavioral: Negative for behavioral problems and dysphoric mood. The patient is not nervous/anxious.     History Past Medical History  Diagnosis Date  . Hypertension   . Thyroid disease   . Hyperlipidemia     She has past surgical history that includes Ectopic pregnancy surgery.   Her family history includes Aneurysm in her father; Arthritis in her mother; Coronary artery disease in an other family member; Diabetes in her maternal grandfather; Hyperlipidemia in an other family member; Hypertension in an other family member.She reports that she has quit smoking. She has never used smokeless tobacco. She reports that she drinks alcohol. She reports that she does not use illicit drugs.  Current Outpatient Prescriptions on File Prior to Visit  Medication Sig Dispense Refill  . ALPRAZolam (XANAX) 0.25 MG tablet Take 1 tablet (0.25 mg total) by mouth 2 (two) times daily as needed for anxiety. 45 tablet 2  . amLODipine (NORVASC) 10 MG tablet Take 1 tablet (10 mg total) by mouth at bedtime.  Office visit due now 90 tablet 0  . atorvastatin (LIPITOR) 20 MG tablet Take 1 tablet (20 mg total) by mouth daily. 90 tablet 3  . escitalopram (LEXAPRO) 20 MG tablet Take 1 tablet (20 mg total) by mouth daily. 30 tablet 5  . levothyroxine (SYNTHROID, LEVOTHROID) 125 MCG tablet Take 1 tablet (125 mcg total) by mouth daily before breakfast. 90 tablet 3  . metoprolol succinate (TOPROL-XL) 100 MG 24 hr tablet TAKE 1/2 TABLET BY MOUTH EVERY MORNING AND TAKE 1 TABLET BY MOUTH EVERY EVENING 45 tablet 5  . potassium chloride (K-DUR) 10 MEQ tablet Take 1 tablet (10 mEq total) by mouth daily. 90 tablet 0  . RABEprazole (ACIPHEX) 20 MG tablet Take 1 tablet (20 mg total) by mouth daily. 90 tablet 1  . valsartan (DIOVAN) 320 MG tablet Take 1 tablet (320 mg total) by mouth daily. 90 tablet 3   No current facility-administered medications on file prior to visit.     Objective:  Objective Physical Exam  Constitutional: She is oriented to person, place, and time. She appears well-developed and well-nourished.  HENT:  Head: Normocephalic and atraumatic.  Eyes: Conjunctivae and EOM are normal.  Neck: Normal range of motion. Neck supple. No JVD present. Carotid bruit is not present. No thyromegaly present.  Cardiovascular: Normal rate, regular rhythm and normal heart sounds.   No murmur heard. Pulmonary/Chest: Effort normal and breath sounds normal. No respiratory distress. She has no wheezes. She has no rales. She exhibits no tenderness.  Musculoskeletal: She exhibits edema.  Left knee: She exhibits decreased range of motion and swelling. Tenderness found. Patellar tendon tenderness noted.       Left ankle: She exhibits decreased range of motion and swelling. Achilles tendon exhibits no pain and no defect.  Neurological: She is alert and oriented to person, place, and time.  Psychiatric: She has a normal mood and affect.   BP 108/70 mmHg  Pulse 63  Temp(Src) 98.5 F (36.9 C) (Oral)  Ht 5\' 6"   (1.676 m)  Wt 292 lb 3.2 oz (132.541 kg)  BMI 47.18 kg/m2  SpO2 97% Wt Readings from Last 3 Encounters:  04/13/15 292 lb 3.2 oz (132.541 kg)  01/06/15 298 lb (135.172 kg)  04/20/14 292 lb (132.45 kg)     Lab Results  Component Value Date   WBC 5.3 12/07/2013   HGB 13.6 12/07/2013   HCT 41.2 12/07/2013   PLT 216.0 12/07/2013   GLUCOSE 104* 01/06/2015   CHOL 234* 01/06/2015   TRIG 118.0 01/06/2015   HDL 42.80 01/06/2015   LDLDIRECT 179.1 09/16/2012   LDLCALC 168* 01/06/2015   ALT 20 01/06/2015   AST 18 01/06/2015   NA 138 01/06/2015   K 3.6 01/06/2015   CL 101 01/06/2015   CREATININE 0.79 01/06/2015   BUN 11 01/06/2015   CO2 32 01/06/2015   TSH 11.728* 01/06/2015    No results found.   Assessment & Plan:  Plan I am having Ms. Montesinos start on cephALEXin. I am also having her maintain her ALPRAZolam, amLODipine, escitalopram, levothyroxine, metoprolol succinate, potassium chloride, RABEprazole, valsartan, atorvastatin, and furosemide.  Meds ordered this encounter  Medications  . cephALEXin (KEFLEX) 500 MG capsule    Sig: Take 1 capsule (500 mg total) by mouth 2 (two) times daily.    Dispense:  20 capsule    Refill:  0  . furosemide (LASIX) 20 MG tablet    Sig: 1-2 po qd edema    Dispense:  60 tablet    Refill:  3    Problem List Items Addressed This Visit    Hyperlipidemia   Relevant Medications   furosemide (LASIX) 20 MG tablet   Other Relevant Orders   Basic metabolic panel   Hepatic function panel   Lipid panel   EDEMA   Relevant Medications   furosemide (LASIX) 20 MG tablet    Other Visit Diagnoses    Cellulitis of left lower extremity    -  Primary    Relevant Medications    cephALEXin (KEFLEX) 500 MG capsule    Lateral knee pain, left        Relevant Orders    DG Knee Complete 4 Views Left    Left ankle pain        Relevant Orders    DG Ankle Complete Left    Hypothyroidism, unspecified hypothyroidism type        Relevant Orders    Basic  metabolic panel    TSH       Follow-up: Return in about 6 months (around 10/14/2015), or if symptoms worsen or fail to improve, for hyperlipidemia, hypertension, fasting.  [name], DO

## 2015-04-13 NOTE — Telephone Encounter (Signed)
Pt called stating someone called her. If you called please f/u with pt at [phone] tomorrow.

## 2015-04-13 NOTE — Progress Notes (Signed)
Pre visit review using our clinic review tool, if applicable. No additional management support is needed unless otherwise documented below in the visit note. 

## 2015-04-13 NOTE — Telephone Encounter (Signed)
I tried to call the patient again and VM full.     KP

## 2015-04-13 NOTE — Patient Instructions (Signed)

## 2015-04-14 NOTE — Telephone Encounter (Signed)
Spoke with patient and she has been made aware of the results and she verbalized understanding, she stated that she dropped 3 of her Keflex down the drain and she want to make Dr.Lowne aware. I verbal made Dr.Lowne aware and she agreed that if no improvement she would refill the Rx.      KP

## 2015-04-17 ENCOUNTER — Other Ambulatory Visit: Payer: Self-pay | Admitting: Family Medicine

## 2015-04-17 ENCOUNTER — Other Ambulatory Visit: Payer: Self-pay

## 2015-05-01 ENCOUNTER — Telehealth: Payer: Self-pay | Admitting: Family Medicine

## 2015-05-01 DIAGNOSIS — L03116 Cellulitis of left lower limb: Secondary | ICD-10-CM

## 2015-05-01 MED ORDER — CEPHALEXIN 500 MG PO CAPS: 500.0000 mg | ORAL_CAPSULE | Freq: Two times a day (BID) | ORAL | Status: DC

## 2015-05-01 NOTE — Telephone Encounter (Signed)
Caller name:PATIENT Relationship to patient:SELF Can be [phone] Pharmacy: [facility]   Reason for call:WAS ON ANTIBIOTIC AND STOPPED THEM 4 DAYS AGO.  SHE SPILLED ABOUT 4 DOWN THE SINK.  SHOULD SHE BE ON SOME MORE ANTIBIOTIC

## 2015-05-01 NOTE — Telephone Encounter (Signed)
Pt returned call. Notified RX sent in.

## 2015-05-01 NOTE — Telephone Encounter (Signed)
Rx faxed, I tried to call the patient to make her aware and her VM is full. If she returns the call, please make her aware that the antibiotics have been sent to the pharmacy.     KP

## 2015-05-01 NOTE — Telephone Encounter (Signed)
Ok to send in 4 more days

## 2015-05-01 NOTE — Telephone Encounter (Signed)
Spoke with patient and she stated the Cellulitis is healing but it is still there, she dropped 4 down the drain a few days ago and you advised it was ok to send more if it had not completely improved. Please advise if it ok to send in.      KP

## 2015-07-13 ENCOUNTER — Telehealth: Payer: Self-pay

## 2015-07-13 NOTE — Telephone Encounter (Signed)
Pre Visit call completed. 

## 2015-07-14 ENCOUNTER — Ambulatory Visit (INDEPENDENT_AMBULATORY_CARE_PROVIDER_SITE_OTHER): Payer: Managed Care, Other (non HMO) | Admitting: Family Medicine

## 2015-07-14 ENCOUNTER — Encounter (INDEPENDENT_AMBULATORY_CARE_PROVIDER_SITE_OTHER): Payer: Self-pay

## 2015-07-14 ENCOUNTER — Encounter: Payer: Self-pay | Admitting: Family Medicine

## 2015-07-14 VITALS — BP 124/84 | HR 75 | Temp 98.1°F | Ht 66.0 in | Wt 292.4 lb

## 2015-07-14 DIAGNOSIS — I10 Essential (primary) hypertension: Secondary | ICD-10-CM | POA: Diagnosis not present

## 2015-07-14 DIAGNOSIS — E785 Hyperlipidemia, unspecified: Secondary | ICD-10-CM

## 2015-07-14 DIAGNOSIS — R601 Generalized edema: Secondary | ICD-10-CM

## 2015-07-14 DIAGNOSIS — Z1159 Encounter for screening for other viral diseases: Secondary | ICD-10-CM

## 2015-07-14 DIAGNOSIS — Z0000 Encounter for general adult medical examination without abnormal findings: Secondary | ICD-10-CM | POA: Diagnosis not present

## 2015-07-14 DIAGNOSIS — E039 Hypothyroidism, unspecified: Secondary | ICD-10-CM

## 2015-07-14 LAB — LIPID PANEL
Cholesterol: 149 mg/dL (ref 0–200)
HDL: 36.4 mg/dL — ABNORMAL LOW (ref >39.00)
LDL Cholesterol: 97 mg/dL (ref 0–99)
NONHDL: 113.05
Total CHOL/HDL Ratio: 4
Triglycerides: 81 mg/dL (ref 0.0–149.0)
VLDL: 16.2 mg/dL (ref 0.0–40.0)

## 2015-07-14 LAB — CBC WITH DIFFERENTIAL/PLATELET
Basophils Absolute: 0 10*3/uL (ref 0.0–0.1)
Basophils Relative: 0.6 % (ref 0.0–3.0)
EOS PCT: 3.1 % (ref 0.0–5.0)
Eosinophils Absolute: 0.1 10*3/uL (ref 0.0–0.7)
HCT: 40.1 % (ref 36.0–46.0)
Hemoglobin: 13.3 g/dL (ref 12.0–15.0)
LYMPHS ABS: 1.7 10*3/uL (ref 0.7–4.0)
Lymphocytes Relative: 37.3 % (ref 12.0–46.0)
MCHC: 33 g/dL (ref 30.0–36.0)
MCV: 89.4 fl (ref 78.0–100.0)
MONOS PCT: 6.6 % (ref 3.0–12.0)
Monocytes Absolute: 0.3 10*3/uL (ref 0.1–1.0)
NEUTROS ABS: 2.4 10*3/uL (ref 1.4–7.7)
Neutrophils Relative %: 52.4 % (ref 43.0–77.0)
PLATELETS: 236 10*3/uL (ref 150.0–400.0)
RBC: 4.49 Mil/uL (ref 3.87–5.11)
RDW: 15.7 % — AB (ref 11.5–15.5)
WBC: 4.6 10*3/uL (ref 4.0–10.5)

## 2015-07-14 LAB — POCT URINALYSIS DIPSTICK
Bilirubin, UA: NEGATIVE
Glucose, UA: NEGATIVE
Leukocytes, UA: NEGATIVE
Nitrite, UA: NEGATIVE
PH UA: 6
RBC UA: NEGATIVE
SPEC GRAV UA: 1.025
UROBILINOGEN UA: 0.2

## 2015-07-14 LAB — MICROALBUMIN / CREATININE URINE RATIO
CREATININE, U: 462.8 mg/dL
MICROALB UR: 8.2 mg/dL — AB (ref 0.0–1.9)
MICROALB/CREAT RATIO: 1.8 mg/g (ref 0.0–30.0)

## 2015-07-14 LAB — COMPREHENSIVE METABOLIC PANEL
ALT: 16 U/L (ref 0–35)
AST: 14 U/L (ref 0–37)
Albumin: 3.9 g/dL (ref 3.5–5.2)
Alkaline Phosphatase: 90 U/L (ref 39–117)
BILIRUBIN TOTAL: 0.4 mg/dL (ref 0.2–1.2)
BUN: 9 mg/dL (ref 6–23)
CO2: 32 mEq/L (ref 19–32)
Calcium: 9.1 mg/dL (ref 8.4–10.5)
Chloride: 103 mEq/L (ref 96–112)
Creatinine, Ser: 0.75 mg/dL (ref 0.40–1.20)
GFR: 101.55 mL/min (ref >60.00)
GLUCOSE: 110 mg/dL — AB (ref 70–99)
POTASSIUM: 3.6 meq/L (ref 3.5–5.1)
Sodium: 140 mEq/L (ref 135–145)
TOTAL PROTEIN: 7.8 g/dL (ref 6.0–8.3)

## 2015-07-14 LAB — HIV ANTIBODY (ROUTINE TESTING W REFLEX): HIV 1&2 Ab, 4th Generation: NONREACTIVE

## 2015-07-14 LAB — TSH: TSH: 0.57 u[IU]/mL (ref 0.35–4.50)

## 2015-07-14 MED ORDER — LEVOTHYROXINE SODIUM 125 MCG PO TABS: 125.0000 ug | ORAL_TABLET | Freq: Every day | ORAL | Status: DC

## 2015-07-14 MED ORDER — FUROSEMIDE 20 MG PO TABS: ORAL_TABLET | ORAL | Status: DC

## 2015-07-14 NOTE — Progress Notes (Signed)
Subjective:     Tracey Page is a 59 y.o. female and is here for a comprehensive physical exam. The patient reports no problems.  Social History   Social History  . Marital Status: Single    Spouse Name: N/A  . Number of Children: N/A  . Years of Education: N/A   Occupational History  . Not on file.   Social History Main Topics  . Smoking status: Former Research scientist (life sciences)  . Smokeless tobacco: Never Used  . Alcohol Use: Yes     Comment: socially, not even on weekly basis  . Drug Use: No  . Sexual Activity: Yes    Birth Control/ Protection: Post-menopausal   Other Topics Concern  . Not on file   Social History Narrative   Health Maintenance  Topic Date Due  . Hepatitis C Screening  [birth year]  . MAMMOGRAM  09/08/2014  . INFLUENZA VACCINE  05/08/2015  . HIV Screening  04/12/2016 (Originally 01/20/1971)  . PAP SMEAR  09/08/2016  . COLONOSCOPY  06/17/2018  . TETANUS/TDAP  04/20/2024    The following portions of the patient's history were reviewed and updated as appropriate:  She  has a past medical history of Hypertension; Thyroid disease; and Hyperlipidemia. She  does not have any pertinent problems on file. She  has past surgical history that includes Ectopic pregnancy surgery. Her family history includes Aneurysm in her father; Arthritis in her mother; Coronary artery disease in an other family member; Diabetes in her maternal grandfather; Hyperlipidemia in an other family member; Hypertension in an other family member. She  reports that she has quit smoking. She has never used smokeless tobacco. She reports that she drinks alcohol. She reports that she does not use illicit drugs. She has a current medication list which includes the following prescription(s): amlodipine, atorvastatin, furosemide, levothyroxine, metoprolol succinate, potassium chloride, rabeprazole, valsartan, and alprazolam. Current Outpatient Prescriptions on File Prior to Visit  Medication Sig Dispense Refill   . amLODipine (NORVASC) 10 MG tablet Take 1 tablet (10 mg total) by mouth daily. 90 tablet 1  . atorvastatin (LIPITOR) 20 MG tablet Take 1 tablet (20 mg total) by mouth daily. 90 tablet 3  . levothyroxine (SYNTHROID, LEVOTHROID) 125 MCG tablet Take 1 tablet (125 mcg total) by mouth daily before breakfast. 90 tablet 3  . metoprolol succinate (TOPROL-XL) 100 MG 24 hr tablet TAKE 1/2 TABLET BY MOUTH EVERY MORNING AND TAKE 1 TABLET BY MOUTH EVERY EVENING 45 tablet 5  . potassium chloride (K-DUR) 10 MEQ tablet Take 1 tablet (10 mEq total) by mouth daily. 90 tablet 0  . RABEprazole (ACIPHEX) 20 MG tablet Take 1 tablet (20 mg total) by mouth daily. 90 tablet 1  . valsartan (DIOVAN) 320 MG tablet Take 1 tablet (320 mg total) by mouth daily. 90 tablet 3  . ALPRAZolam (XANAX) 0.25 MG tablet Take 1 tablet (0.25 mg total) by mouth 2 (two) times daily as needed for anxiety. (Patient not taking: Reported on 07/14/2015) 45 tablet 2   No current facility-administered medications on file prior to visit.   She is allergic to iodine; shellfish allergy; and sulfonamide derivatives..  Review of Systems Review of Systems  Constitutional: Negative for activity change, appetite change and fatigue.  HENT: Negative for hearing loss, congestion, tinnitus and ear discharge.  dentist q47mEyes: Negative for visual disturbance (see optho q1y -- vision corrected to 20/20 with glasses).  Respiratory: Negative for cough, chest tightness and shortness of breath.   Cardiovascular: Negative for chest pain,  palpitations and leg swelling.  Gastrointestinal: Negative for abdominal pain, diarrhea, constipation and abdominal distention.  Genitourinary: Negative for urgency, frequency, decreased urine volume and difficulty urinating.  Musculoskeletal: Negative for back pain, arthralgias and gait problem.  Skin: Negative for color change, pallor and rash.  Neurological: Negative for dizziness, light-headedness, numbness and headaches.   Hematological: Negative for adenopathy. Does not bruise/bleed easily.  Psychiatric/Behavioral: Negative for suicidal ideas, confusion, sleep disturbance, self-injury, dysphoric mood, decreased concentration and agitation.       Objective:    BP 124/84 mmHg  Pulse 75  Temp(Src) 98.1 F (36.7 C) (Oral)  Ht '5\' 6"'  (1.676 m)  Wt 292 lb 6.4 oz (132.632 kg)  BMI 47.22 kg/m2  SpO2 99% General appearance: alert, cooperative, appears stated age and no distress Head: Normocephalic, without obvious abnormality, atraumatic Eyes: conjunctivae/corneas clear. PERRL, EOM's intact. Fundi benign. Ears: normal TM's and external ear canals both ears Nose: Nares normal. Septum midline. Mucosa normal. No drainage or sinus tenderness. Throat: lips, mucosa, and tongue normal; teeth and gums normal Neck: no adenopathy, no carotid bruit, no JVD, supple, symmetrical, trachea midline and thyroid not enlarged, symmetric, no tenderness/mass/nodules Back: symmetric, no curvature. ROM normal. No CVA tenderness. Lungs: clear to auscultation bilaterally Breasts: gyn Heart: regular rate and rhythm, S1, S2 normal, no murmur, click, rub or gallop Abdomen: soft, non-tender; bowel sounds normal; no masses,  no organomegaly Pelvic: deferred-- gyn Extremities: extremities normal, atraumatic, no cyanosis or edema Pulses: 2+ and symmetric Skin: Skin color, texture, turgor normal. No rashes or lesions Lymph nodes: Cervical, supraclavicular, and axillary nodes normal. Neurologic: Alert and oriented X 3, normal strength and tone. Normal symmetric reflexes. Normal coordination and gait Psych- no depression, no anxiety      Assessment:    Healthy female exam.      Plan:    ghm utd Check labs See After Visit Summary for Counseling Recommendations    1. Hyperlipidemia LDL goal <100 Check labs con't lipitor  - Comp Met (CMET) - Lipid panel  2. Hypothyroidism, unspecified hypothyroidism type con't synthroid -  TSH  3. Essential hypertension con't diovan and norvasc - Comp Met (CMET) - CBC with Differential/Platelet - Microalbumin / creatinine urine ratio - POCT urinalysis dipstick - TSH  4. Preventative health care See avs Check meds ghm utd - Comp Met (CMET) - CBC with Differential/Platelet - Lipid panel - Microalbumin / creatinine urine ratio - POCT urinalysis dipstick - TSH - HIV antibody  5. Generalized edema   - furosemide (LASIX) 20 MG tablet; 1-2 po qd edema  Dispense: 60 tablet; Refill: 3  6. Need for hepatitis C screening test   - Hepatitis C antibody

## 2015-07-14 NOTE — Progress Notes (Signed)
Pre visit review using our clinic review tool, if applicable. No additional management support is needed unless otherwise documented below in the visit note. 

## 2015-07-14 NOTE — Patient Instructions (Signed)
Preventive Care for Adults, Female A healthy lifestyle and preventive care can promote health and wellness. Preventive health guidelines for women include the following key practices.  A routine yearly physical is a good way to check with your health care provider about your health and preventive screening. It is a chance to share any concerns and updates on your health and to receive a thorough exam.  Visit your dentist for a routine exam and preventive care every 6 months. Brush your teeth twice a day and floss once a day. Good oral hygiene prevents tooth decay and gum disease.  The frequency of eye exams is based on your age, health, family medical history, use of contact lenses, and other factors. Follow your health care provider's recommendations for frequency of eye exams.  Eat a healthy diet. Foods like vegetables, fruits, whole grains, low-fat dairy products, and lean protein foods contain the nutrients you need without too many calories. Decrease your intake of foods high in solid fats, added sugars, and salt. Eat the right amount of calories for you.Get information about a proper diet from your health care provider, if necessary.  Regular physical exercise is one of the most important things you can do for your health. Most adults should get at least 150 minutes of moderate-intensity exercise (any activity that increases your heart rate and causes you to sweat) each week. In addition, most adults need muscle-strengthening exercises on 2 or more days a week.  Maintain a healthy weight. The body mass index (BMI) is a screening tool to identify possible weight problems. It provides an estimate of body fat based on height and weight. Your health care provider can find your BMI and can help you achieve or maintain a healthy weight.For adults 20 years and older:  A BMI below 18.5 is considered underweight.  A BMI of 18.5 to 24.9 is normal.  A BMI of 25 to 29.9 is considered overweight.  A  BMI of 30 and above is considered obese.  Maintain normal blood lipids and cholesterol levels by exercising and minimizing your intake of saturated fat. Eat a balanced diet with plenty of fruit and vegetables. Blood tests for lipids and cholesterol should begin at age 45 and be repeated every 5 years. If your lipid or cholesterol levels are high, you are over 50, or you are at high risk for heart disease, you may need your cholesterol levels checked more frequently.Ongoing high lipid and cholesterol levels should be treated with medicines if diet and exercise are not working.  If you smoke, find out from your health care provider how to quit. If you do not use tobacco, do not start.  Lung cancer screening is recommended for adults aged 45-80 years who are at high risk for developing lung cancer because of a history of smoking. A yearly low-dose CT scan of the lungs is recommended for people who have at least a 30-pack-year history of smoking and are a current smoker or have quit within the past 15 years. A pack year of smoking is smoking an average of 1 pack of cigarettes a day for 1 year (for example: 1 pack a day for 30 years or 2 packs a day for 15 years). Yearly screening should continue until the smoker has stopped smoking for at least 15 years. Yearly screening should be stopped for people who develop a health problem that would prevent them from having lung cancer treatment.  If you are pregnant, do not drink alcohol. If you are  breastfeeding, be very cautious about drinking alcohol. If you are not pregnant and choose to drink alcohol, do not have more than 1 drink per day. One drink is considered to be 12 ounces (355 mL) of beer, 5 ounces (148 mL) of wine, or 1.5 ounces (44 mL) of liquor.  Avoid use of street drugs. Do not share needles with anyone. Ask for help if you need support or instructions about stopping the use of drugs.  High blood pressure causes heart disease and increases the risk  of stroke. Your blood pressure should be checked at least every 1 to 2 years. Ongoing high blood pressure should be treated with medicines if weight loss and exercise do not work.  If you are 55-79 years old, ask your health care provider if you should take aspirin to prevent strokes.  Diabetes screening is done by taking a blood sample to check your blood glucose level after you have not eaten for a certain period of time (fasting). If you are not overweight and you do not have risk factors for diabetes, you should be screened once every 3 years starting at age 45. If you are overweight or obese and you are 40-70 years of age, you should be screened for diabetes every year as part of your cardiovascular risk assessment.  Breast cancer screening is essential preventive care for women. You should practice "breast self-awareness." This means understanding the normal appearance and feel of your breasts and may include breast self-examination. Any changes detected, no matter how small, should be reported to a health care provider. Women in their 20s and 30s should have a clinical breast exam (CBE) by a health care provider as part of a regular health exam every 1 to 3 years. After age 40, women should have a CBE every year. Starting at age 40, women should consider having a mammogram (breast X-ray test) every year. Women who have a family history of breast cancer should talk to their health care provider about genetic screening. Women at a high risk of breast cancer should talk to their health care providers about having an MRI and a mammogram every year.  Breast cancer gene (BRCA)-related cancer risk assessment is recommended for women who have family members with BRCA-related cancers. BRCA-related cancers include breast, ovarian, tubal, and peritoneal cancers. Having family members with these cancers may be associated with an increased risk for harmful changes (mutations) in the breast cancer genes BRCA1 and  BRCA2. Results of the assessment will determine the need for genetic counseling and BRCA1 and BRCA2 testing.  Your health care provider may recommend that you be screened regularly for cancer of the pelvic organs (ovaries, uterus, and vagina). This screening involves a pelvic examination, including checking for microscopic changes to the surface of your cervix (Pap test). You may be encouraged to have this screening done every 3 years, beginning at age 21.  For women ages 30-65, health care providers may recommend pelvic exams and Pap testing every 3 years, or they may recommend the Pap and pelvic exam, combined with testing for human papilloma virus (HPV), every 5 years. Some types of HPV increase your risk of cervical cancer. Testing for HPV may also be done on women of any age with unclear Pap test results.  Other health care providers may not recommend any screening for nonpregnant women who are considered low risk for pelvic cancer and who do not have symptoms. Ask your health care provider if a screening pelvic exam is right for   you.  If you have had past treatment for cervical cancer or a condition that could lead to cancer, you need Pap tests and screening for cancer for at least 20 years after your treatment. If Pap tests have been discontinued, your risk factors (such as having a new sexual partner) need to be reassessed to determine if screening should resume. Some women have medical problems that increase the chance of getting cervical cancer. In these cases, your health care provider may recommend more frequent screening and Pap tests.  Colorectal cancer can be detected and often prevented. Most routine colorectal cancer screening begins at the age of 50 years and continues through age 75 years. However, your health care provider may recommend screening at an earlier age if you have risk factors for colon cancer. On a yearly basis, your health care provider may provide home test kits to check  for hidden blood in the stool. Use of a small camera at the end of a tube, to directly examine the colon (sigmoidoscopy or colonoscopy), can detect the earliest forms of colorectal cancer. Talk to your health care provider about this at age 50, when routine screening begins. Direct exam of the colon should be repeated every 5-10 years through age 75 years, unless early forms of precancerous polyps or small growths are found.  People who are at an increased risk for hepatitis B should be screened for this virus. You are considered at high risk for hepatitis B if:  You were born in a country where hepatitis B occurs often. Talk with your health care provider about which countries are considered high risk.  Your parents were born in a high-risk country and you have not received a shot to protect against hepatitis B (hepatitis B vaccine).  You have HIV or AIDS.  You use needles to inject street drugs.  You live with, or have sex with, someone who has hepatitis B.  You get hemodialysis treatment.  You take certain medicines for conditions like cancer, organ transplantation, and autoimmune conditions.  Hepatitis C blood testing is recommended for all people born from 1945 through 1965 and any individual with known risks for hepatitis C.  Practice safe sex. Use condoms and avoid high-risk sexual practices to reduce the spread of sexually transmitted infections (STIs). STIs include gonorrhea, chlamydia, syphilis, trichomonas, herpes, HPV, and human immunodeficiency virus (HIV). Herpes, HIV, and HPV are viral illnesses that have no cure. They can result in disability, cancer, and death.  You should be screened for sexually transmitted illnesses (STIs) including gonorrhea and chlamydia if:  You are sexually active and are younger than 24 years.  You are older than 24 years and your health care provider tells you that you are at risk for this type of infection.  Your sexual activity has changed  since you were last screened and you are at an increased risk for chlamydia or gonorrhea. Ask your health care provider if you are at risk.  If you are at risk of being infected with HIV, it is recommended that you take a prescription medicine daily to prevent HIV infection. This is called preexposure prophylaxis (PrEP). You are considered at risk if:  You are sexually active and do not regularly use condoms or know the HIV status of your partner(s).  You take drugs by injection.  You are sexually active with a partner who has HIV.  Talk with your health care provider about whether you are at high risk of being infected with HIV. If   you choose to begin PrEP, you should first be tested for HIV. You should then be tested every 3 months for as long as you are taking PrEP.  Osteoporosis is a disease in which the bones lose minerals and strength with aging. This can result in serious bone fractures or breaks. The risk of osteoporosis can be identified using a bone density scan. Women ages 67 years and over and women at risk for fractures or osteoporosis should discuss screening with their health care providers. Ask your health care provider whether you should take a calcium supplement or vitamin D to reduce the rate of osteoporosis.  Menopause can be associated with physical symptoms and risks. Hormone replacement therapy is available to decrease symptoms and risks. You should talk to your health care provider about whether hormone replacement therapy is right for you.  Use sunscreen. Apply sunscreen liberally and repeatedly throughout the day. You should seek shade when your shadow is shorter than you. Protect yourself by wearing long sleeves, pants, a wide-brimmed hat, and sunglasses year round, whenever you are outdoors.  Once a month, do a whole body skin exam, using a mirror to look at the skin on your back. Tell your health care provider of new moles, moles that have irregular borders, moles that  are larger than a pencil eraser, or moles that have changed in shape or color.  Stay current with required vaccines (immunizations).  Influenza vaccine. All adults should be immunized every year.  Tetanus, diphtheria, and acellular pertussis (Td, Tdap) vaccine. Pregnant women should receive 1 dose of Tdap vaccine during each pregnancy. The dose should be obtained regardless of the length of time since the last dose. Immunization is preferred during the 27th-36th week of gestation. An adult who has not previously received Tdap or who does not know her vaccine status should receive 1 dose of Tdap. This initial dose should be followed by tetanus and diphtheria toxoids (Td) booster doses every 10 years. Adults with an unknown or incomplete history of completing a 3-dose immunization series with Td-containing vaccines should begin or complete a primary immunization series including a Tdap dose. Adults should receive a Td booster every 10 years.  Varicella vaccine. An adult without evidence of immunity to varicella should receive 2 doses or a second dose if she has previously received 1 dose. Pregnant females who do not have evidence of immunity should receive the first dose after pregnancy. This first dose should be obtained before leaving the health care facility. The second dose should be obtained 4-8 weeks after the first dose.  Human papillomavirus (HPV) vaccine. Females aged 13-26 years who have not received the vaccine previously should obtain the 3-dose series. The vaccine is not recommended for use in pregnant females. However, pregnancy testing is not needed before receiving a dose. If a female is found to be pregnant after receiving a dose, no treatment is needed. In that case, the remaining doses should be delayed until after the pregnancy. Immunization is recommended for any person with an immunocompromised condition through the age of 61 years if she did not get any or all doses earlier. During the  3-dose series, the second dose should be obtained 4-8 weeks after the first dose. The third dose should be obtained 24 weeks after the first dose and 16 weeks after the second dose.  Zoster vaccine. One dose is recommended for adults aged 30 years or older unless certain conditions are present.  Measles, mumps, and rubella (MMR) vaccine. Adults born  before 1957 generally are considered immune to measles and mumps. Adults born in 1957 or later should have 1 or more doses of MMR vaccine unless there is a contraindication to the vaccine or there is laboratory evidence of immunity to each of the three diseases. A routine second dose of MMR vaccine should be obtained at least 28 days after the first dose for students attending postsecondary schools, health care workers, or international travelers. People who received inactivated measles vaccine or an unknown type of measles vaccine during 1963-1967 should receive 2 doses of MMR vaccine. People who received inactivated mumps vaccine or an unknown type of mumps vaccine before 1979 and are at high risk for mumps infection should consider immunization with 2 doses of MMR vaccine. For females of childbearing age, rubella immunity should be determined. If there is no evidence of immunity, females who are not pregnant should be vaccinated. If there is no evidence of immunity, females who are pregnant should delay immunization until after pregnancy. Unvaccinated health care workers born before 1957 who lack laboratory evidence of measles, mumps, or rubella immunity or laboratory confirmation of disease should consider measles and mumps immunization with 2 doses of MMR vaccine or rubella immunization with 1 dose of MMR vaccine.  Pneumococcal 13-valent conjugate (PCV13) vaccine. When indicated, a person who is uncertain of his immunization history and has no record of immunization should receive the PCV13 vaccine. All adults 65 years of age and older should receive this  vaccine. An adult aged 19 years or older who has certain medical conditions and has not been previously immunized should receive 1 dose of PCV13 vaccine. This PCV13 should be followed with a dose of pneumococcal polysaccharide (PPSV23) vaccine. Adults who are at high risk for pneumococcal disease should obtain the PPSV23 vaccine at least 8 weeks after the dose of PCV13 vaccine. Adults older than 59 years of age who have normal immune system function should obtain the PPSV23 vaccine dose at least 1 year after the dose of PCV13 vaccine.  Pneumococcal polysaccharide (PPSV23) vaccine. When PCV13 is also indicated, PCV13 should be obtained first. All adults aged 65 years and older should be immunized. An adult younger than age 65 years who has certain medical conditions should be immunized. Any person who resides in a nursing home or long-term care facility should be immunized. An adult smoker should be immunized. People with an immunocompromised condition and certain other conditions should receive both PCV13 and PPSV23 vaccines. People with human immunodeficiency virus (HIV) infection should be immunized as soon as possible after diagnosis. Immunization during chemotherapy or radiation therapy should be avoided. Routine use of PPSV23 vaccine is not recommended for American Indians, [place] Natives, or people younger than 65 years unless there are medical conditions that require PPSV23 vaccine. When indicated, people who have unknown immunization and have no record of immunization should receive PPSV23 vaccine. One-time revaccination 5 years after the first dose of PPSV23 is recommended for people aged 19-64 years who have chronic kidney failure, nephrotic syndrome, asplenia, or immunocompromised conditions. People who received 1-2 doses of PPSV23 before age 65 years should receive another dose of PPSV23 vaccine at age 65 years or later if at least 5 years have passed since the previous dose. Doses of PPSV23 are not  needed for people immunized with PPSV23 at or after age 65 years.  Meningococcal vaccine. Adults with asplenia or persistent complement component deficiencies should receive 2 doses of quadrivalent meningococcal conjugate (MenACWY-D) vaccine. The doses should be obtained   at least 2 months apart. Microbiologists working with certain meningococcal bacteria, [place] recruits, people at risk during an outbreak, and people who travel to or live in countries with a high rate of meningitis should be immunized. A first-year college student up through age 34 years who is living in a residence hall should receive a dose if she did not receive a dose on or after her 16th birthday. Adults who have certain high-risk conditions should receive one or more doses of vaccine.  Hepatitis A vaccine. Adults who wish to be protected from this disease, have certain high-risk conditions, work with hepatitis A-infected animals, work in hepatitis A research labs, or travel to or work in countries with a high rate of hepatitis A should be immunized. Adults who were previously unvaccinated and who anticipate close contact with an international adoptee during the first 60 days after arrival in the [place] from a country with a high rate of hepatitis A should be immunized.  Hepatitis B vaccine. Adults who wish to be protected from this disease, have certain high-risk conditions, may be exposed to blood or other infectious body fluids, are household contacts or sex partners of hepatitis B positive people, are clients or workers in certain care facilities, or travel to or work in countries with a high rate of hepatitis B should be immunized.  Haemophilus influenzae type b (Hib) vaccine. A previously unvaccinated person with asplenia or sickle cell disease or having a scheduled splenectomy should receive 1 dose of Hib vaccine. Regardless of previous immunization, a recipient of a hematopoietic stem cell transplant should receive a  3-dose series 6-12 months after her successful transplant. Hib vaccine is not recommended for adults with HIV infection. Preventive Services / Frequency Ages 35 to 4 years  Blood pressure check.** / Every 3-5 years.  Lipid and cholesterol check.** / Every 5 years beginning at age 60.  Clinical breast exam.** / Every 3 years for women in their 71s and 10s.  BRCA-related cancer risk assessment.** / For women who have family members with a BRCA-related cancer (breast, ovarian, tubal, or peritoneal cancers).  Pap test.** / Every 2 years from ages 76 through 26. Every 3 years starting at age 61 through age 76 or 93 with a history of 3 consecutive normal Pap tests.  HPV screening.** / Every 3 years from ages 37 through ages 60 to 51 with a history of 3 consecutive normal Pap tests.  Hepatitis C blood test.** / For any individual with known risks for hepatitis C.  Skin self-exam. / Monthly.  Influenza vaccine. / Every year.  Tetanus, diphtheria, and acellular pertussis (Tdap, Td) vaccine.** / Consult your health care provider. Pregnant women should receive 1 dose of Tdap vaccine during each pregnancy. 1 dose of Td every 10 years.  Varicella vaccine.** / Consult your health care provider. Pregnant females who do not have evidence of immunity should receive the first dose after pregnancy.  HPV vaccine. / 3 doses over 6 months, if 93 and younger. The vaccine is not recommended for use in pregnant females. However, pregnancy testing is not needed before receiving a dose.  Measles, mumps, rubella (MMR) vaccine.** / You need at least 1 dose of MMR if you were born in 1957 or later. You may also need a 2nd dose. For females of childbearing age, rubella immunity should be determined. If there is no evidence of immunity, females who are not pregnant should be vaccinated. If there is no evidence of immunity, females who are  pregnant should delay immunization until after pregnancy.  Pneumococcal  13-valent conjugate (PCV13) vaccine.** / Consult your health care provider.  Pneumococcal polysaccharide (PPSV23) vaccine.** / 1 to 2 doses if you smoke cigarettes or if you have certain conditions.  Meningococcal vaccine.** / 1 dose if you are age 68 to 8 years and a Market researcher living in a residence hall, or have one of several medical conditions, you need to get vaccinated against meningococcal disease. You may also need additional booster doses.  Hepatitis A vaccine.** / Consult your health care provider.  Hepatitis B vaccine.** / Consult your health care provider.  Haemophilus influenzae type b (Hib) vaccine.** / Consult your health care provider. Ages 7 to 53 years  Blood pressure check.** / Every year.  Lipid and cholesterol check.** / Every 5 years beginning at age 25 years.  Lung cancer screening. / Every year if you are aged 11-80 years and have a 30-pack-year history of smoking and currently smoke or have quit within the past 15 years. Yearly screening is stopped once you have quit smoking for at least 15 years or develop a health problem that would prevent you from having lung cancer treatment.  Clinical breast exam.** / Every year after age 48 years.  BRCA-related cancer risk assessment.** / For women who have family members with a BRCA-related cancer (breast, ovarian, tubal, or peritoneal cancers).  Mammogram.** / Every year beginning at age 41 years and continuing for as long as you are in good health. Consult with your health care provider.  Pap test.** / Every 3 years starting at age 65 years through age 37 or 70 years with a history of 3 consecutive normal Pap tests.  HPV screening.** / Every 3 years from ages 72 years through ages 60 to 40 years with a history of 3 consecutive normal Pap tests.  Fecal occult blood test (FOBT) of stool. / Every year beginning at age 21 years and continuing until age 5 years. You may not need to do this test if you get  a colonoscopy every 10 years.  Flexible sigmoidoscopy or colonoscopy.** / Every 5 years for a flexible sigmoidoscopy or every 10 years for a colonoscopy beginning at age 35 years and continuing until age 48 years.  Hepatitis C blood test.** / For all people born from [birth year] through 1965 and any individual with known risks for hepatitis C.  Skin self-exam. / Monthly.  Influenza vaccine. / Every year.  Tetanus, diphtheria, and acellular pertussis (Tdap/Td) vaccine.** / Consult your health care provider. Pregnant women should receive 1 dose of Tdap vaccine during each pregnancy. 1 dose of Td every 10 years.  Varicella vaccine.** / Consult your health care provider. Pregnant females who do not have evidence of immunity should receive the first dose after pregnancy.  Zoster vaccine.** / 1 dose for adults aged 30 years or older.  Measles, mumps, rubella (MMR) vaccine.** / You need at least 1 dose of MMR if you were born in 1957 or later. You may also need a second dose. For females of childbearing age, rubella immunity should be determined. If there is no evidence of immunity, females who are not pregnant should be vaccinated. If there is no evidence of immunity, females who are pregnant should delay immunization until after pregnancy.  Pneumococcal 13-valent conjugate (PCV13) vaccine.** / Consult your health care provider.  Pneumococcal polysaccharide (PPSV23) vaccine.** / 1 to 2 doses if you smoke cigarettes or if you have certain conditions.  Meningococcal vaccine.** /  Consult your health care provider.  Hepatitis A vaccine.** / Consult your health care provider.  Hepatitis B vaccine.** / Consult your health care provider.  Haemophilus influenzae type b (Hib) vaccine.** / Consult your health care provider. Ages 64 years and over  Blood pressure check.** / Every year.  Lipid and cholesterol check.** / Every 5 years beginning at age 23 years.  Lung cancer screening. / Every year if you  are aged 16-80 years and have a 30-pack-year history of smoking and currently smoke or have quit within the past 15 years. Yearly screening is stopped once you have quit smoking for at least 15 years or develop a health problem that would prevent you from having lung cancer treatment.  Clinical breast exam.** / Every year after age 74 years.  BRCA-related cancer risk assessment.** / For women who have family members with a BRCA-related cancer (breast, ovarian, tubal, or peritoneal cancers).  Mammogram.** / Every year beginning at age 44 years and continuing for as long as you are in good health. Consult with your health care provider.  Pap test.** / Every 3 years starting at age 58 years through age 22 or 39 years with 3 consecutive normal Pap tests. Testing can be stopped between 65 and 70 years with 3 consecutive normal Pap tests and no abnormal Pap or HPV tests in the past 10 years.  HPV screening.** / Every 3 years from ages 64 years through ages 70 or 61 years with a history of 3 consecutive normal Pap tests. Testing can be stopped between 65 and 70 years with 3 consecutive normal Pap tests and no abnormal Pap or HPV tests in the past 10 years.  Fecal occult blood test (FOBT) of stool. / Every year beginning at age 40 years and continuing until age 27 years. You may not need to do this test if you get a colonoscopy every 10 years.  Flexible sigmoidoscopy or colonoscopy.** / Every 5 years for a flexible sigmoidoscopy or every 10 years for a colonoscopy beginning at age 7 years and continuing until age 32 years.  Hepatitis C blood test.** / For all people born from [birth year] through 1965 and any individual with known risks for hepatitis C.  Osteoporosis screening.** / A one-time screening for women ages 30 years and over and women at risk for fractures or osteoporosis.  Skin self-exam. / Monthly.  Influenza vaccine. / Every year.  Tetanus, diphtheria, and acellular pertussis (Tdap/Td)  vaccine.** / 1 dose of Td every 10 years.  Varicella vaccine.** / Consult your health care provider.  Zoster vaccine.** / 1 dose for adults aged 35 years or older.  Pneumococcal 13-valent conjugate (PCV13) vaccine.** / Consult your health care provider.  Pneumococcal polysaccharide (PPSV23) vaccine.** / 1 dose for all adults aged 46 years and older.  Meningococcal vaccine.** / Consult your health care provider.  Hepatitis A vaccine.** / Consult your health care provider.  Hepatitis B vaccine.** / Consult your health care provider.  Haemophilus influenzae type b (Hib) vaccine.** / Consult your health care provider. ** Family history and personal history of risk and conditions may change your health care provider's recommendations.   This information is not intended to replace advice given to you by your health care provider. Make sure you discuss any questions you have with your health care provider.   Document Released: 11/19/2001 Document Revised: 10/14/2014 Document Reviewed: 02/18/2011 Elsevier Interactive Patient Education [employer].

## 2015-07-15 LAB — HEPATITIS C ANTIBODY: HCV Ab: NEGATIVE

## 2015-07-24 ENCOUNTER — Other Ambulatory Visit (INDEPENDENT_AMBULATORY_CARE_PROVIDER_SITE_OTHER): Payer: Managed Care, Other (non HMO)

## 2015-07-24 DIAGNOSIS — R809 Proteinuria, unspecified: Secondary | ICD-10-CM

## 2015-07-25 LAB — PROTEIN, URINE, 24 HOUR
PROTEIN, URINE: 6 mg/dL (ref 5–24)
Protein, 24H Urine: 126 mg/24 h (ref <150)

## 2015-07-28 ENCOUNTER — Telehealth: Payer: Self-pay | Admitting: Family Medicine

## 2015-07-28 NOTE — Telephone Encounter (Signed)
Pt scheduled for Monday 10/24 at 4:00p.

## 2015-07-28 NOTE — Telephone Encounter (Signed)
Relation to CC:EQFD Call back number:[phone]   Reason for call:  Patient states at last OV 07/14/2015 patient informed PCP of leg pain and an antibiotic was priscribed (patient does not know the name of abx). Patient was at the supermarket and a cart hit her right leg and patient is in discomfort. Offered an appointment but patient is requesting a refill of the abx prescribed before. Please advise

## 2015-07-28 NOTE — Telephone Encounter (Signed)
I don't see any documentation of an ABX. Please advise     KP

## 2015-07-28 NOTE — Telephone Encounter (Signed)
We would need to see her for this.---- if its not better we need to see it

## 2015-07-28 NOTE — Telephone Encounter (Signed)
Patient VM is full can not leave message regarding her request below

## 2015-07-31 ENCOUNTER — Encounter: Payer: Self-pay | Admitting: Family Medicine

## 2015-07-31 ENCOUNTER — Ambulatory Visit (INDEPENDENT_AMBULATORY_CARE_PROVIDER_SITE_OTHER): Payer: Managed Care, Other (non HMO) | Admitting: Family Medicine

## 2015-07-31 VITALS — BP 131/65 | HR 70 | Temp 98.0°F | Ht 65.0 in | Wt 291.6 lb

## 2015-07-31 DIAGNOSIS — L02419 Cutaneous abscess of limb, unspecified: Secondary | ICD-10-CM

## 2015-07-31 DIAGNOSIS — L03119 Cellulitis of unspecified part of limb: Secondary | ICD-10-CM | POA: Diagnosis not present

## 2015-07-31 MED ORDER — AMOXICILLIN-POT CLAVULANATE 875-125 MG PO TABS: 1.0000 | ORAL_TABLET | Freq: Two times a day (BID) | ORAL | Status: DC

## 2015-07-31 NOTE — Progress Notes (Signed)
Patient ID: Tracey Page, female    DOB: 1955/10/22  Age: 59 y.o. MRN: [id number]    Subjective:  Subjective HPI Tracey Page presents for R leg swelling and pain-- she hit her leg on a buggy and grocery store.   Review of Systems  Constitutional: Negative for diaphoresis, appetite change, fatigue and unexpected weight change.  Eyes: Negative for pain, redness and visual disturbance.  Respiratory: Negative for cough, chest tightness, shortness of breath and wheezing.   Cardiovascular: Negative for chest pain, palpitations and leg swelling.  Endocrine: Negative for cold intolerance, heat intolerance, polydipsia, polyphagia and polyuria.  Genitourinary: Negative for dysuria, frequency and difficulty urinating.  Neurological: Negative for dizziness, light-headedness, numbness and headaches.  All other systems reviewed and are negative.   History Past Medical History  Diagnosis Date  . Hypertension   . Thyroid disease   . Hyperlipidemia     She has past surgical history that includes Ectopic pregnancy surgery.   Her family history includes Aneurysm in her father; Arthritis in her mother; Coronary artery disease in an other family member; Diabetes in her maternal grandfather; Hyperlipidemia in an other family member; Hypertension in an other family member.She reports that she has quit smoking. She has never used smokeless tobacco. She reports that she drinks alcohol. She reports that she does not use illicit drugs.  Current Outpatient Prescriptions on File Prior to Visit  Medication Sig Dispense Refill  . ALPRAZolam (XANAX) 0.25 MG tablet Take 1 tablet (0.25 mg total) by mouth 2 (two) times daily as needed for anxiety. 45 tablet 2  . amLODipine (NORVASC) 10 MG tablet Take 1 tablet (10 mg total) by mouth daily. 90 tablet 1  . atorvastatin (LIPITOR) 20 MG tablet Take 1 tablet (20 mg total) by mouth daily. 90 tablet 3  . furosemide (LASIX) 20 MG tablet 1-2 po qd edema 60 tablet 3  .  levothyroxine (SYNTHROID, LEVOTHROID) 125 MCG tablet Take 1 tablet (125 mcg total) by mouth daily before breakfast. 90 tablet 3  . metoprolol succinate (TOPROL-XL) 100 MG 24 hr tablet TAKE 1/2 TABLET BY MOUTH EVERY MORNING AND TAKE 1 TABLET BY MOUTH EVERY EVENING 45 tablet 5  . potassium chloride (K-DUR) 10 MEQ tablet Take 1 tablet (10 mEq total) by mouth daily. 90 tablet 0  . RABEprazole (ACIPHEX) 20 MG tablet Take 1 tablet (20 mg total) by mouth daily. 90 tablet 1  . valsartan (DIOVAN) 320 MG tablet Take 1 tablet (320 mg total) by mouth daily. 90 tablet 3   No current facility-administered medications on file prior to visit.     Objective:  Objective Physical Exam  Constitutional: She is oriented to person, place, and time. She appears well-developed and well-nourished.  HENT:  Head: Normocephalic and atraumatic.  Eyes: Conjunctivae and EOM are normal.  Neck: Normal range of motion. Neck supple. No JVD present. Carotid bruit is not present. No thyromegaly present.  Cardiovascular: Normal rate, regular rhythm and normal heart sounds.   No murmur heard. Pulmonary/Chest: Effort normal and breath sounds normal. No respiratory distress. She has no wheezes. She has no rales. She exhibits no tenderness.  Musculoskeletal: She exhibits no edema.  Neurological: She is alert and oriented to person, place, and time.  Skin: There is erythema.     Psychiatric: She has a normal mood and affect.   BP 131/65 mmHg  Pulse 70  Temp(Src) 98 F (36.7 C) (Oral)  Ht 5\' 5"  (1.651 m)  Wt 291 lb 9.6 oz (  132.269 kg)  BMI 48.52 kg/m2  SpO2 100% Wt Readings from Last 3 Encounters:  07/31/15 291 lb 9.6 oz (132.269 kg)  07/14/15 292 lb 6.4 oz (132.632 kg)  04/13/15 292 lb 3.2 oz (132.541 kg)     Lab Results  Component Value Date   WBC 4.6 07/14/2015   HGB 13.3 07/14/2015   HCT 40.1 07/14/2015   PLT 236.0 07/14/2015   GLUCOSE 110* 07/14/2015   CHOL 149 07/14/2015   TRIG 81.0 07/14/2015   HDL  36.40* 07/14/2015   LDLDIRECT 179.1 09/16/2012   LDLCALC 97 07/14/2015   ALT 16 07/14/2015   AST 14 07/14/2015   NA 140 07/14/2015   K 3.6 07/14/2015   CL 103 07/14/2015   CREATININE 0.75 07/14/2015   BUN 9 07/14/2015   CO2 32 07/14/2015   TSH 0.57 07/14/2015   MICROALBUR 8.2* 07/14/2015    Dg Ankle Complete Left  04/13/2015  CLINICAL DATA:  Left ankle pain for 2 weeks, fell down steps EXAM: LEFT ANKLE COMPLETE - 3+ VIEW COMPARISON:  None. FINDINGS: The ankle joint appears normal. No fracture is seen. Alignment is normal. There is soft tissue swelling diffusely. IMPRESSION: No acute fracture.  Soft tissue swelling. Electronically Signed   By: Ivar Drape M.D.   On: 04/13/2015 15:31   Dg Knee Complete 4 Views Left  04/13/2015  CLINICAL DATA:  Knee pain for 2 weeks.  Fall. EXAM: LEFT KNEE - COMPLETE 4+ VIEW COMPARISON:  None. FINDINGS: No acute bony or joint abnormality identified. No evidence of fracture or dislocation. IMPRESSION: No acute abnormality. Electronically Signed   By: Marcello Moores  Register   On: 04/13/2015 15:32     Assessment & Plan:  Plan I am having Ms. Kwan maintain her ALPRAZolam, metoprolol succinate, potassium chloride, RABEprazole, valsartan, atorvastatin, amLODipine, furosemide, levothyroxine, and amoxicillin-clavulanate.  Meds ordered this encounter  Medications  . DISCONTD: amoxicillin-clavulanate (AUGMENTIN) 875-125 MG tablet    Sig: Take 1 tablet by mouth 2 (two) times daily.    Dispense:  20 tablet    Refill:  0  . amoxicillin-clavulanate (AUGMENTIN) 875-125 MG tablet    Sig: Take 1 tablet by mouth 2 (two) times daily.    Dispense:  20 tablet    Refill:  0    Problem List Items Addressed This Visit    None    Visit Diagnoses    Cellulitis and abscess of leg    -  Primary    Relevant Medications    amoxicillin-clavulanate (AUGMENTIN) 875-125 MG tablet       Follow-up: Return in about 1 week (around 08/07/2015), or if symptoms worsen or fail to  improve, for recheck leg.  Tracey Koyanagi, DO

## 2015-07-31 NOTE — Patient Instructions (Signed)

## 2015-07-31 NOTE — Progress Notes (Signed)
Pre visit review using our clinic review tool, if applicable. No additional management support is needed unless otherwise documented below in the visit note. 

## 2015-08-08 ENCOUNTER — Telehealth: Payer: Self-pay | Admitting: Family Medicine

## 2015-08-08 ENCOUNTER — Ambulatory Visit: Payer: Managed Care, Other (non HMO) | Admitting: Family Medicine

## 2015-08-11 NOTE — Telephone Encounter (Signed)
Pt was no show 08/08/15 5:00pm for follow up appt, pt did not reschedule, charge or no charge?

## 2015-08-12 NOTE — Telephone Encounter (Signed)
Not like her to no show-- please call

## 2015-08-14 NOTE — Telephone Encounter (Signed)
No charge. 

## 2015-08-14 NOTE — Telephone Encounter (Signed)
Pt states that she left msg to cancel and reschedule 08/08/15 appt and no one called her back. No record of this noted in system. Pt asked to come 08/15/15 5:30pm so I added her to the schedule.

## 2015-08-15 ENCOUNTER — Ambulatory Visit (INDEPENDENT_AMBULATORY_CARE_PROVIDER_SITE_OTHER): Payer: Managed Care, Other (non HMO) | Admitting: Family Medicine

## 2015-08-15 ENCOUNTER — Other Ambulatory Visit: Payer: Self-pay | Admitting: Family Medicine

## 2015-08-15 ENCOUNTER — Encounter: Payer: Self-pay | Admitting: Family Medicine

## 2015-08-15 VITALS — BP 142/82 | HR 88 | Temp 98.4°F | Wt 295.4 lb

## 2015-08-15 DIAGNOSIS — L03115 Cellulitis of right lower limb: Secondary | ICD-10-CM | POA: Diagnosis not present

## 2015-08-15 DIAGNOSIS — L02419 Cutaneous abscess of limb, unspecified: Secondary | ICD-10-CM | POA: Diagnosis not present

## 2015-08-15 DIAGNOSIS — L03119 Cellulitis of unspecified part of limb: Secondary | ICD-10-CM | POA: Diagnosis not present

## 2015-08-15 MED ORDER — DOXYCYCLINE HYCLATE 100 MG PO TABS: 100.0000 mg | ORAL_TABLET | Freq: Two times a day (BID) | ORAL | Status: DC

## 2015-08-15 NOTE — Progress Notes (Signed)
Patient ID: Tracey Page, female    DOB: 1956/01/07  Age: 59 y.o. MRN: [id number]    Subjective:  Subjective HPI [name] presents for f/u cellulitis r low ext.  It is improving but not completley.  No fever, no calf pain.    Review of Systems  Constitutional: Negative for diaphoresis, appetite change, fatigue and unexpected weight change.  Eyes: Negative for pain, redness and visual disturbance.  Respiratory: Negative for cough, chest tightness, shortness of breath and wheezing.   Cardiovascular: Negative for chest pain, palpitations and leg swelling.  Endocrine: Negative for cold intolerance, heat intolerance, polydipsia, polyphagia and polyuria.  Genitourinary: Negative for dysuria, frequency and difficulty urinating.  Skin: Positive for color change and wound.  Neurological: Negative for dizziness, light-headedness, numbness and headaches.    History Past Medical History  Diagnosis Date  . Hypertension   . Thyroid disease   . Hyperlipidemia     She has past surgical history that includes Ectopic pregnancy surgery.   Her family history includes Aneurysm in her father; Arthritis in her mother; Coronary artery disease in an other family member; Diabetes in her maternal grandfather; Hyperlipidemia in an other family member; Hypertension in an other family member.She reports that she has quit smoking. She has never used smokeless tobacco. She reports that she drinks alcohol. She reports that she does not use illicit drugs.  Current Outpatient Prescriptions on File Prior to Visit  Medication Sig Dispense Refill  . ALPRAZolam (XANAX) 0.25 MG tablet Take 1 tablet (0.25 mg total) by mouth 2 (two) times daily as needed for anxiety. 45 tablet 2  . amLODipine (NORVASC) 10 MG tablet Take 1 tablet (10 mg total) by mouth daily. 90 tablet 1  . atorvastatin (LIPITOR) 20 MG tablet Take 1 tablet (20 mg total) by mouth daily. 90 tablet 3  . furosemide (LASIX) 20 MG tablet 1-2 po qd edema  60 tablet 3  . levothyroxine (SYNTHROID, LEVOTHROID) 125 MCG tablet Take 1 tablet (125 mcg total) by mouth daily before breakfast. 90 tablet 3  . metoprolol succinate (TOPROL-XL) 100 MG 24 hr tablet TAKE 1/2 TABLET BY MOUTH EVERY MORNING AND TAKE 1 TABLET BY MOUTH EVERY EVENING 45 tablet 5  . potassium chloride (K-DUR) 10 MEQ tablet Take 1 tablet (10 mEq total) by mouth daily. 90 tablet 0  . RABEprazole (ACIPHEX) 20 MG tablet Take 1 tablet (20 mg total) by mouth daily. 90 tablet 1  . valsartan (DIOVAN) 320 MG tablet Take 1 tablet (320 mg total) by mouth daily. 90 tablet 3   No current facility-administered medications on file prior to visit.     Objective:  Objective Physical Exam  Constitutional: She is oriented to person, place, and time. She appears well-developed and well-nourished.  HENT:  Head: Normocephalic and atraumatic.  Eyes: Conjunctivae and EOM are normal.  Neck: Normal range of motion. Neck supple. No JVD present. Carotid bruit is not present. No thyromegaly present.  Cardiovascular: Normal rate, regular rhythm and normal heart sounds.   No murmur heard. Pulmonary/Chest: Effort normal and breath sounds normal. No respiratory distress. She has no wheezes. She has no rales. She exhibits no tenderness.  Musculoskeletal: She exhibits no edema.  Neurological: She is alert and oriented to person, place, and time.  Skin: Rash noted. There is erythema.     Psychiatric: She has a normal mood and affect.  Nursing note and vitals reviewed.  BP 142/82 mmHg  Pulse 88  Temp(Src) 98.4 F (36.9 C) (Oral)  Wt 295 lb 6.4 oz (133.993 kg)  SpO2 98% Wt Readings from Last 3 Encounters:  08/15/15 295 lb 6.4 oz (133.993 kg)  07/31/15 291 lb 9.6 oz (132.269 kg)  07/14/15 292 lb 6.4 oz (132.632 kg)     Lab Results  Component Value Date   WBC 4.6 07/14/2015   HGB 13.3 07/14/2015   HCT 40.1 07/14/2015   PLT 236.0 07/14/2015   GLUCOSE 110* 07/14/2015   CHOL 149 07/14/2015   TRIG  81.0 07/14/2015   HDL 36.40* 07/14/2015   LDLDIRECT 179.1 09/16/2012   LDLCALC 97 07/14/2015   ALT 16 07/14/2015   AST 14 07/14/2015   NA 140 07/14/2015   K 3.6 07/14/2015   CL 103 07/14/2015   CREATININE 0.75 07/14/2015   BUN 9 07/14/2015   CO2 32 07/14/2015   TSH 0.57 07/14/2015   MICROALBUR 8.2* 07/14/2015    Dg Ankle Complete Left  04/13/2015  CLINICAL DATA:  Left ankle pain for 2 weeks, fell down steps EXAM: LEFT ANKLE COMPLETE - 3+ VIEW COMPARISON:  None. FINDINGS: The ankle joint appears normal. No fracture is seen. Alignment is normal. There is soft tissue swelling diffusely. IMPRESSION: No acute fracture.  Soft tissue swelling. Electronically Signed   By: Ivar Drape M.D.   On: 04/13/2015 15:31   Dg Knee Complete 4 Views Left  04/13/2015  CLINICAL DATA:  Knee pain for 2 weeks.  Fall. EXAM: LEFT KNEE - COMPLETE 4+ VIEW COMPARISON:  None. FINDINGS: No acute bony or joint abnormality identified. No evidence of fracture or dislocation. IMPRESSION: No acute abnormality. Electronically Signed   By: Marcello Moores  Register   On: 04/13/2015 15:32     Assessment & Plan:  Plan I have discontinued Ms. Angert's amoxicillin-clavulanate. I am also having her start on doxycycline. Additionally, I am having her maintain her ALPRAZolam, metoprolol succinate, potassium chloride, RABEprazole, valsartan, atorvastatin, amLODipine, furosemide, and levothyroxine.  Meds ordered this encounter  Medications  . doxycycline (VIBRA-TABS) 100 MG tablet    Sig: Take 1 tablet (100 mg total) by mouth 2 (two) times daily.    Dispense:  20 tablet    Refill:  0    Problem List Items Addressed This Visit    None    Visit Diagnoses    Cellulitis and abscess of leg    -  Primary    Relevant Medications    doxycycline (VIBRA-TABS) 100 MG tablet       Follow-up: Return if symptoms worsen or fail to improve.  [name], DO

## 2015-08-15 NOTE — Assessment & Plan Note (Signed)
Change abx to doxycycline 100 mg bid If no better by Thursday--- I want to see her Friday Call if any questions

## 2015-08-15 NOTE — Patient Instructions (Signed)

## 2015-08-15 NOTE — Progress Notes (Signed)
Pre visit review using our clinic review tool, if applicable. No additional management support is needed unless otherwise documented below in the visit note. 

## 2015-09-19 ENCOUNTER — Ambulatory Visit (INDEPENDENT_AMBULATORY_CARE_PROVIDER_SITE_OTHER): Payer: Managed Care, Other (non HMO) | Admitting: Family Medicine

## 2015-09-19 ENCOUNTER — Encounter: Payer: Self-pay | Admitting: Family Medicine

## 2015-09-19 VITALS — BP 132/80 | HR 86 | Temp 98.2°F | Ht 65.0 in | Wt 298.8 lb

## 2015-09-19 DIAGNOSIS — L03115 Cellulitis of right lower limb: Secondary | ICD-10-CM | POA: Diagnosis not present

## 2015-09-19 MED ORDER — LEVOFLOXACIN 500 MG PO TABS: 500.0000 mg | ORAL_TABLET | Freq: Every day | ORAL | Status: DC

## 2015-09-19 NOTE — Progress Notes (Signed)
Pre visit review using our clinic review tool, if applicable. No additional management support is needed unless otherwise documented below in the visit note. 

## 2015-09-19 NOTE — Progress Notes (Signed)
Patient ID: Tracey Page, female    DOB: Feb 08, 1956  Age: 59 y.o. MRN: [id number]    Subjective:  Subjective HPI Tracey Page presents for f/u cellulitis.  Not healing and abx are finished.  No other complaints.    Review of Systems  Constitutional: Negative for diaphoresis, appetite change, fatigue and unexpected weight change.  Eyes: Negative for pain, redness and visual disturbance.  Respiratory: Negative for cough, chest tightness, shortness of breath and wheezing.   Cardiovascular: Negative for chest pain, palpitations and leg swelling.  Endocrine: Negative for cold intolerance, heat intolerance, polydipsia, polyphagia and polyuria.  Genitourinary: Negative for dysuria, frequency and difficulty urinating.  Skin: Positive for wound.  Neurological: Negative for dizziness, light-headedness, numbness and headaches.    History Past Medical History  Diagnosis Date  . Hypertension   . Thyroid disease   . Hyperlipidemia     She has past surgical history that includes Ectopic pregnancy surgery.   Her family history includes Aneurysm in her father; Arthritis in her mother; Coronary artery disease in an other family member; Diabetes in her maternal grandfather; Hyperlipidemia in an other family member; Hypertension in an other family member.She reports that she has quit smoking. She has never used smokeless tobacco. She reports that she drinks alcohol. She reports that she does not use illicit drugs.  Current Outpatient Prescriptions on File Prior to Visit  Medication Sig Dispense Refill  . ALPRAZolam (XANAX) 0.25 MG tablet Take 1 tablet (0.25 mg total) by mouth 2 (two) times daily as needed for anxiety. 45 tablet 2  . amLODipine (NORVASC) 10 MG tablet Take 1 tablet (10 mg total) by mouth daily. 90 tablet 1  . atorvastatin (LIPITOR) 20 MG tablet Take 1 tablet (20 mg total) by mouth daily. 90 tablet 3  . furosemide (LASIX) 20 MG tablet 1-2 po qd edema 60 tablet 3  . levothyroxine  (SYNTHROID, LEVOTHROID) 125 MCG tablet Take 1 tablet (125 mcg total) by mouth daily before breakfast. 90 tablet 3  . metoprolol succinate (TOPROL-XL) 100 MG 24 hr tablet TAKE 1/2 TABLET BY MOUTH EVERY MORNING AND TAKE 1 TABLET BY MOUTH EVERY EVENING 45 tablet 5  . potassium chloride (K-DUR) 10 MEQ tablet Take 1 tablet (10 mEq total) by mouth daily. 90 tablet 0  . RABEprazole (ACIPHEX) 20 MG tablet Take 1 tablet (20 mg total) by mouth daily. 90 tablet 1  . valsartan (DIOVAN) 320 MG tablet Take 1 tablet (320 mg total) by mouth daily. 90 tablet 3   No current facility-administered medications on file prior to visit.     Objective:  Objective Physical Exam  Constitutional: She is oriented to person, place, and time. She appears well-developed and well-nourished.  HENT:  Head: Normocephalic and atraumatic.  Eyes: Conjunctivae and EOM are normal.  Neck: Normal range of motion. Neck supple. No JVD present. Carotid bruit is not present. No thyromegaly present.  Cardiovascular: Normal rate, regular rhythm and normal heart sounds.   No murmur heard. Pulmonary/Chest: Effort normal and breath sounds normal. No respiratory distress. She has no wheezes. She has no rales. She exhibits no tenderness.  Musculoskeletal: She exhibits no edema.  Neurological: She is alert and oriented to person, place, and time.  Skin: There is erythema.     Psychiatric: She has a normal mood and affect.   BP 132/80 mmHg  Pulse 86  Temp(Src) 98.2 F (36.8 C) (Oral)  Ht 5\' 5"  (1.651 m)  Wt 298 lb 12.8 oz (135.535 kg)  BMI 49.72 kg/m2  SpO2 98% Wt Readings from Last 3 Encounters:  09/19/15 298 lb 12.8 oz (135.535 kg)  08/15/15 295 lb 6.4 oz (133.993 kg)  07/31/15 291 lb 9.6 oz (132.269 kg)     Lab Results  Component Value Date   WBC 4.6 07/14/2015   HGB 13.3 07/14/2015   HCT 40.1 07/14/2015   PLT 236.0 07/14/2015   GLUCOSE 110* 07/14/2015   CHOL 149 07/14/2015   TRIG 81.0 07/14/2015   HDL 36.40*  07/14/2015   LDLDIRECT 179.1 09/16/2012   LDLCALC 97 07/14/2015   ALT 16 07/14/2015   AST 14 07/14/2015   NA 140 07/14/2015   K 3.6 07/14/2015   CL 103 07/14/2015   CREATININE 0.75 07/14/2015   BUN 9 07/14/2015   CO2 32 07/14/2015   TSH 0.57 07/14/2015   MICROALBUR 8.2* 07/14/2015    Dg Ankle Complete Left  04/13/2015  CLINICAL DATA:  Left ankle pain for 2 weeks, fell down steps EXAM: LEFT ANKLE COMPLETE - 3+ VIEW COMPARISON:  None. FINDINGS: The ankle joint appears normal. No fracture is seen. Alignment is normal. There is soft tissue swelling diffusely. IMPRESSION: No acute fracture.  Soft tissue swelling. Electronically Signed   By: Ivar Drape M.D.   On: 04/13/2015 15:31   Dg Knee Complete 4 Views Left  04/13/2015  CLINICAL DATA:  Knee pain for 2 weeks.  Fall. EXAM: LEFT KNEE - COMPLETE 4+ VIEW COMPARISON:  None. FINDINGS: No acute bony or joint abnormality identified. No evidence of fracture or dislocation. IMPRESSION: No acute abnormality. Electronically Signed   By: Marcello Moores  Register   On: 04/13/2015 15:32     Assessment & Plan:  Plan I have discontinued Tracey Page's doxycycline. I am also having her start on levofloxacin. Additionally, I am having her maintain her ALPRAZolam, metoprolol succinate, potassium chloride, RABEprazole, valsartan, atorvastatin, amLODipine, furosemide, and levothyroxine.  Meds ordered this encounter  Medications  . levofloxacin (LEVAQUIN) 500 MG tablet    Sig: Take 1 tablet (500 mg total) by mouth daily.    Dispense:  10 tablet    Refill:  0    Problem List Items Addressed This Visit    Cellulitis of right lower leg    Guaze dressing to R low ext levaquin for 10 days Refer to wound clinic       Other Visit Diagnoses    Cellulitis of right lower extremity    -  Primary    Relevant Medications    levofloxacin (LEVAQUIN) 500 MG tablet    Other Relevant Orders    AMB referral to wound care center       Follow-up: Return if symptoms worsen  or fail to improve.  [name], DO

## 2015-09-19 NOTE — Assessment & Plan Note (Signed)
Guaze dressing to R low ext levaquin for 10 days Refer to wound clinic

## 2015-09-19 NOTE — Patient Instructions (Signed)

## 2015-09-20 ENCOUNTER — Telehealth: Payer: Self-pay

## 2015-09-20 NOTE — Telephone Encounter (Signed)
-----   Message from Elveria Royals sent at 09/20/2015 11:31 AM EST ----- The wound center at [facility] and [facility] are both outpatient surgical.  The patient's insurance does not have coverage for that service.  Maudie Mercury will call the patient and then advise me if I should cancel the appointment I made for the patient at [facility] next week

## 2015-09-20 NOTE — Telephone Encounter (Signed)
The patient has been made aware and she wanted to know what your suggestion is before we cancelled the appointment. She said she does not want the bill but if she has to go to the wound center then she wants that to be the last resort. She said in the  Meantime she will continue to take ABX.      KP

## 2015-09-21 NOTE — Telephone Encounter (Signed)
May ID will do this---- have marj / jen check please

## 2015-09-21 NOTE — Telephone Encounter (Signed)
Tracey Page has been made aware, awaiting response.     KP

## 2015-09-22 NOTE — Telephone Encounter (Signed)
Unable to leave a message. The mailbox is full.     KP

## 2015-09-25 NOTE — Telephone Encounter (Signed)
Spoke with the patient and she said she would not be able to make the apt, she said the area is improving with the new Abx and wrap, she thinks her pants could have been making the area worst.  Marj would you cancel the apt please, she will not be able to pay for it.       KP

## 2015-09-25 NOTE — Telephone Encounter (Signed)
Follow up scheduled for 10/03/15.    KP

## 2015-09-25 NOTE — Telephone Encounter (Signed)
Pt can cancel Pt needs f/u here then so we can see it

## 2015-10-03 ENCOUNTER — Other Ambulatory Visit: Payer: Managed Care, Other (non HMO)

## 2015-10-03 ENCOUNTER — Ambulatory Visit (INDEPENDENT_AMBULATORY_CARE_PROVIDER_SITE_OTHER): Payer: Managed Care, Other (non HMO) | Admitting: Family Medicine

## 2015-10-03 ENCOUNTER — Encounter: Payer: Self-pay | Admitting: Family Medicine

## 2015-10-03 VITALS — BP 130/74 | HR 57 | Temp 97.8°F | Ht 65.0 in | Wt 299.8 lb

## 2015-10-03 DIAGNOSIS — L659 Nonscarring hair loss, unspecified: Secondary | ICD-10-CM | POA: Diagnosis not present

## 2015-10-03 DIAGNOSIS — L03115 Cellulitis of right lower limb: Secondary | ICD-10-CM | POA: Diagnosis not present

## 2015-10-03 DIAGNOSIS — S81801D Unspecified open wound, right lower leg, subsequent encounter: Secondary | ICD-10-CM

## 2015-10-03 MED ORDER — LEVOFLOXACIN 500 MG PO TABS: 500.0000 mg | ORAL_TABLET | Freq: Every day | ORAL | Status: DC

## 2015-10-03 NOTE — Patient Instructions (Signed)

## 2015-10-03 NOTE — Progress Notes (Signed)
Pre visit review using our clinic review tool, if applicable. No additional management support is needed unless otherwise documented below in the visit note. 

## 2015-10-03 NOTE — Progress Notes (Signed)
Patient ID: Tracey Page, female    DOB: 12-Jun-1956  Age: 59 y.o. MRN: [id number]    Subjective:  Subjective HPI Tracey Page presents for f/u wound/ cellulitis on R leg .  It has dried up.  Pt was unable to afford wound clinic but as of Jan 1 she will have money in her flex spending.   Review of Systems  Constitutional: Negative for diaphoresis, appetite change, fatigue and unexpected weight change.  Eyes: Negative for pain, redness and visual disturbance.  Respiratory: Negative for cough, chest tightness, shortness of breath and wheezing.   Cardiovascular: Negative for chest pain, palpitations and leg swelling.  Endocrine: Negative for cold intolerance, heat intolerance, polydipsia, polyphagia and polyuria.  Genitourinary: Negative for dysuria, frequency and difficulty urinating.  Neurological: Negative for dizziness, light-headedness, numbness and headaches.    History Past Medical History  Diagnosis Date  . Hypertension   . Thyroid disease   . Hyperlipidemia     She has past surgical history that includes Ectopic pregnancy surgery.   Her family history includes Aneurysm in her father; Arthritis in her mother; Diabetes in her maternal grandfather.She reports that she has quit smoking. She has never used smokeless tobacco. She reports that she drinks alcohol. She reports that she does not use illicit drugs.  Current Outpatient Prescriptions on File Prior to Visit  Medication Sig Dispense Refill  . ALPRAZolam (XANAX) 0.25 MG tablet Take 1 tablet (0.25 mg total) by mouth 2 (two) times daily as needed for anxiety. 45 tablet 2  . amLODipine (NORVASC) 10 MG tablet Take 1 tablet (10 mg total) by mouth daily. 90 tablet 1  . atorvastatin (LIPITOR) 20 MG tablet Take 1 tablet (20 mg total) by mouth daily. 90 tablet 3  . furosemide (LASIX) 20 MG tablet 1-2 po qd edema 60 tablet 3  . levothyroxine (SYNTHROID, LEVOTHROID) 125 MCG tablet Take 1 tablet (125 mcg total) by mouth daily before  breakfast. 90 tablet 3  . metoprolol succinate (TOPROL-XL) 100 MG 24 hr tablet TAKE 1/2 TABLET BY MOUTH EVERY MORNING AND TAKE 1 TABLET BY MOUTH EVERY EVENING 45 tablet 5  . potassium chloride (K-DUR) 10 MEQ tablet Take 1 tablet (10 mEq total) by mouth daily. 90 tablet 0  . RABEprazole (ACIPHEX) 20 MG tablet Take 1 tablet (20 mg total) by mouth daily. 90 tablet 1  . valsartan (DIOVAN) 320 MG tablet Take 1 tablet (320 mg total) by mouth daily. 90 tablet 3   No current facility-administered medications on file prior to visit.     Objective:  Objective Physical Exam  Constitutional: She is oriented to person, place, and time. She appears well-developed and well-nourished.  HENT:  Head: Normocephalic and atraumatic.  Eyes: Conjunctivae and EOM are normal.  Neck: Normal range of motion. Neck supple. No JVD present. Carotid bruit is not present. No thyromegaly present.  Cardiovascular: Normal rate, regular rhythm and normal heart sounds.   No murmur heard. Pulmonary/Chest: Effort normal and breath sounds normal. No respiratory distress. She has no wheezes. She has no rales. She exhibits no tenderness.  Musculoskeletal: She exhibits no edema.  Neurological: She is alert and oriented to person, place, and time.  Skin:     Psychiatric: She has a normal mood and affect. Her behavior is normal.  Nursing note and vitals reviewed.  BP 130/74 mmHg  Pulse 57  Temp(Src) 97.8 F (36.6 C) (Oral)  Ht 5\' 5"  (1.651 m)  Wt 299 lb 12.8 oz (135.988 kg)  BMI 49.89 kg/m2  SpO2 100% Wt Readings from Last 3 Encounters:  10/03/15 299 lb 12.8 oz (135.988 kg)  09/19/15 298 lb 12.8 oz (135.535 kg)  08/15/15 295 lb 6.4 oz (133.993 kg)     Lab Results  Component Value Date   WBC 4.6 07/14/2015   HGB 13.3 07/14/2015   HCT 40.1 07/14/2015   PLT 236.0 07/14/2015   GLUCOSE 110* 07/14/2015   CHOL 149 07/14/2015   TRIG 81.0 07/14/2015   HDL 36.40* 07/14/2015   LDLDIRECT 179.1 09/16/2012   LDLCALC 97  07/14/2015   ALT 16 07/14/2015   AST 14 07/14/2015   NA 140 07/14/2015   K 3.6 07/14/2015   CL 103 07/14/2015   CREATININE 0.75 07/14/2015   BUN 9 07/14/2015   CO2 32 07/14/2015   TSH 0.57 07/14/2015   MICROALBUR 8.2* 07/14/2015    Dg Ankle Complete Left  04/13/2015  CLINICAL DATA:  Left ankle pain for 2 weeks, fell down steps EXAM: LEFT ANKLE COMPLETE - 3+ VIEW COMPARISON:  None. FINDINGS: The ankle joint appears normal. No fracture is seen. Alignment is normal. There is soft tissue swelling diffusely. IMPRESSION: No acute fracture.  Soft tissue swelling. Electronically Signed   By: Ivar Drape M.D.   On: 04/13/2015 15:31   Dg Knee Complete 4 Views Left  04/13/2015  CLINICAL DATA:  Knee pain for 2 weeks.  Fall. EXAM: LEFT KNEE - COMPLETE 4+ VIEW COMPARISON:  None. FINDINGS: No acute bony or joint abnormality identified. No evidence of fracture or dislocation. IMPRESSION: No acute abnormality. Electronically Signed   By: Marcello Moores  Register   On: 04/13/2015 15:32     Assessment & Plan:  Plan I am having [name] maintain her ALPRAZolam, metoprolol succinate, potassium chloride, RABEprazole, valsartan, atorvastatin, amLODipine, furosemide, levothyroxine, and levofloxacin.  Meds ordered this encounter  Medications  . levofloxacin (LEVAQUIN) 500 MG tablet    Sig: Take 1 tablet (500 mg total) by mouth daily.    Dispense:  10 tablet    Refill:  0    Problem List Items Addressed This Visit    None    Visit Diagnoses    Wound of right leg, subsequent encounter    -  Primary    Relevant Orders    Ambulatory referral to Wound Clinic    Cellulitis of right lower extremity        Relevant Medications    levofloxacin (LEVAQUIN) 500 MG tablet    Hair loss        Relevant Orders    Thyroid Panel With TSH       Follow-up: Return if symptoms worsen or fail to improve.  [name], DO

## 2015-10-04 ENCOUNTER — Encounter: Payer: Self-pay | Admitting: Family Medicine

## 2015-10-06 ENCOUNTER — Other Ambulatory Visit: Payer: Managed Care, Other (non HMO)

## 2015-10-10 ENCOUNTER — Other Ambulatory Visit (INDEPENDENT_AMBULATORY_CARE_PROVIDER_SITE_OTHER): Payer: Managed Care, Other (non HMO)

## 2015-10-10 DIAGNOSIS — L659 Nonscarring hair loss, unspecified: Secondary | ICD-10-CM | POA: Diagnosis not present

## 2015-10-11 LAB — THYROID PANEL WITH TSH
FREE THYROXINE INDEX: 2.2 (ref 1.4–3.8)
T3 Uptake: 28 % (ref 22–35)
T4 TOTAL: 7.9 ug/dL (ref 4.5–12.0)
TSH: 1.186 u[IU]/mL (ref 0.350–4.500)

## 2015-10-13 ENCOUNTER — Other Ambulatory Visit: Payer: Self-pay | Admitting: Family Medicine

## 2015-10-16 ENCOUNTER — Ambulatory Visit: Payer: Self-pay | Admitting: Family Medicine

## 2015-11-01 ENCOUNTER — Encounter (HOSPITAL_BASED_OUTPATIENT_CLINIC_OR_DEPARTMENT_OTHER): Payer: Managed Care, Other (non HMO) | Attending: Surgery

## 2015-11-01 DIAGNOSIS — L97811 Non-pressure chronic ulcer of other part of right lower leg limited to breakdown of skin: Secondary | ICD-10-CM | POA: Insufficient documentation

## 2015-11-01 DIAGNOSIS — I890 Lymphedema, not elsewhere classified: Secondary | ICD-10-CM | POA: Diagnosis not present

## 2015-11-01 DIAGNOSIS — I87311 Chronic venous hypertension (idiopathic) with ulcer of right lower extremity: Secondary | ICD-10-CM | POA: Diagnosis present

## 2015-11-01 DIAGNOSIS — L03115 Cellulitis of right lower limb: Secondary | ICD-10-CM | POA: Diagnosis not present

## 2015-11-08 ENCOUNTER — Encounter (HOSPITAL_BASED_OUTPATIENT_CLINIC_OR_DEPARTMENT_OTHER): Payer: Managed Care, Other (non HMO) | Attending: Surgery

## 2015-11-08 DIAGNOSIS — L97811 Non-pressure chronic ulcer of other part of right lower leg limited to breakdown of skin: Secondary | ICD-10-CM | POA: Diagnosis not present

## 2015-11-08 DIAGNOSIS — I872 Venous insufficiency (chronic) (peripheral): Secondary | ICD-10-CM | POA: Insufficient documentation

## 2015-11-08 DIAGNOSIS — I10 Essential (primary) hypertension: Secondary | ICD-10-CM | POA: Insufficient documentation

## 2015-11-08 DIAGNOSIS — I890 Lymphedema, not elsewhere classified: Secondary | ICD-10-CM | POA: Insufficient documentation

## 2015-11-08 DIAGNOSIS — I87311 Chronic venous hypertension (idiopathic) with ulcer of right lower extremity: Secondary | ICD-10-CM | POA: Insufficient documentation

## 2015-11-15 ENCOUNTER — Encounter (HOSPITAL_BASED_OUTPATIENT_CLINIC_OR_DEPARTMENT_OTHER): Payer: Managed Care, Other (non HMO)

## 2015-11-15 DIAGNOSIS — I87311 Chronic venous hypertension (idiopathic) with ulcer of right lower extremity: Secondary | ICD-10-CM | POA: Diagnosis not present

## 2015-11-20 ENCOUNTER — Encounter (HOSPITAL_COMMUNITY): Payer: Self-pay

## 2015-11-22 DIAGNOSIS — I87311 Chronic venous hypertension (idiopathic) with ulcer of right lower extremity: Secondary | ICD-10-CM | POA: Diagnosis not present

## 2015-11-29 DIAGNOSIS — I87311 Chronic venous hypertension (idiopathic) with ulcer of right lower extremity: Secondary | ICD-10-CM | POA: Diagnosis not present

## 2015-12-19 ENCOUNTER — Ambulatory Visit (HOSPITAL_COMMUNITY)
Admission: RE | Admit: 2015-12-19 | Discharge: 2015-12-19 | Disposition: A | Discharge status: Home / Self Care | Payer: Managed Care, Other (non HMO) | Source: Ambulatory Visit | Attending: Vascular Surgery | Admitting: Vascular Surgery

## 2015-12-19 ENCOUNTER — Other Ambulatory Visit: Payer: Self-pay | Admitting: Surgery

## 2015-12-19 DIAGNOSIS — R600 Localized edema: Secondary | ICD-10-CM | POA: Insufficient documentation

## 2015-12-19 DIAGNOSIS — E785 Hyperlipidemia, unspecified: Secondary | ICD-10-CM | POA: Diagnosis not present

## 2015-12-19 DIAGNOSIS — L97919 Non-pressure chronic ulcer of unspecified part of right lower leg with unspecified severity: Secondary | ICD-10-CM | POA: Diagnosis not present

## 2015-12-19 DIAGNOSIS — R609 Edema, unspecified: Secondary | ICD-10-CM | POA: Diagnosis present

## 2015-12-19 DIAGNOSIS — I83893 Varicose veins of bilateral lower extremities with other complications: Secondary | ICD-10-CM | POA: Diagnosis not present

## 2016-01-03 ENCOUNTER — Encounter: Payer: Self-pay | Admitting: Gastroenterology

## 2016-01-11 ENCOUNTER — Ambulatory Visit: Payer: Self-pay

## 2016-01-12 ENCOUNTER — Ambulatory Visit: Payer: Self-pay | Admitting: Family Medicine

## 2016-01-16 ENCOUNTER — Ambulatory Visit (INDEPENDENT_AMBULATORY_CARE_PROVIDER_SITE_OTHER): Payer: Managed Care, Other (non HMO) | Admitting: Family Medicine

## 2016-01-16 ENCOUNTER — Other Ambulatory Visit: Payer: Self-pay | Admitting: Family Medicine

## 2016-01-16 ENCOUNTER — Encounter: Payer: Self-pay | Admitting: Family Medicine

## 2016-01-16 VITALS — BP 134/68 | HR 74 | Temp 98.2°F | Ht 65.0 in | Wt 301.0 lb

## 2016-01-16 DIAGNOSIS — E785 Hyperlipidemia, unspecified: Secondary | ICD-10-CM | POA: Diagnosis not present

## 2016-01-16 DIAGNOSIS — S81801D Unspecified open wound, right lower leg, subsequent encounter: Secondary | ICD-10-CM

## 2016-01-16 DIAGNOSIS — F418 Other specified anxiety disorders: Secondary | ICD-10-CM

## 2016-01-16 DIAGNOSIS — L03115 Cellulitis of right lower limb: Secondary | ICD-10-CM

## 2016-01-16 DIAGNOSIS — I10 Essential (primary) hypertension: Secondary | ICD-10-CM

## 2016-01-16 DIAGNOSIS — R601 Generalized edema: Secondary | ICD-10-CM

## 2016-01-16 MED ORDER — FUROSEMIDE 20 MG PO TABS: ORAL_TABLET | ORAL | Status: DC

## 2016-01-16 MED ORDER — AMLODIPINE BESYLATE 10 MG PO TABS: 10.0000 mg | ORAL_TABLET | Freq: Every day | ORAL | Status: DC

## 2016-01-16 MED ORDER — ALPRAZOLAM 0.25 MG PO TABS: 0.2500 mg | ORAL_TABLET | Freq: Two times a day (BID) | ORAL | Status: DC | PRN

## 2016-01-16 NOTE — Assessment & Plan Note (Signed)
Resolved

## 2016-01-16 NOTE — Patient Instructions (Signed)
Hypertension Hypertension, commonly called high blood pressure, is when the force of blood pumping through your arteries is too strong. Your arteries are the blood vessels that carry blood from your heart throughout your body. A blood pressure reading consists of a higher number over a lower number, such as 110/72. The higher number (systolic) is the pressure inside your arteries when your heart pumps. The lower number (diastolic) is the pressure inside your arteries when your heart relaxes. Ideally you want your blood pressure below 120/80. Hypertension forces your heart to work harder to pump blood. Your arteries may become narrow or stiff. Having untreated or uncontrolled hypertension can cause heart attack, stroke, kidney disease, and other problems. RISK FACTORS Some risk factors for high blood pressure are controllable. Others are not.  Risk factors you cannot control include:   Race. You may be at higher risk if you are African American.  Age. Risk increases with age.  Gender. Men are at higher risk than women before age 45 years. After age 65, women are at higher risk than men. Risk factors you can control include:  Not getting enough exercise or physical activity.  Being overweight.  Getting too much fat, sugar, calories, or salt in your diet.  Drinking too much alcohol. SIGNS AND SYMPTOMS Hypertension does not usually cause signs or symptoms. Extremely high blood pressure (hypertensive crisis) may cause headache, anxiety, shortness of breath, and nosebleed. DIAGNOSIS To check if you have hypertension, your health care provider will measure your blood pressure while you are seated, with your arm held at the level of your heart. It should be measured at least twice using the same arm. Certain conditions can cause a difference in blood pressure between your right and left arms. A blood pressure reading that is higher than normal on one occasion does not mean that you need treatment. If  it is not clear whether you have high blood pressure, you may be asked to return on a different day to have your blood pressure checked again. Or, you may be asked to monitor your blood pressure at home for 1 or more weeks. TREATMENT Treating high blood pressure includes making lifestyle changes and possibly taking medicine. Living a healthy lifestyle can help lower high blood pressure. You may need to change some of your habits. Lifestyle changes may include:  Following the DASH diet. This diet is high in fruits, vegetables, and whole grains. It is low in salt, red meat, and added sugars.  Keep your sodium intake below 2,300 mg per day.  Getting at least 30-45 minutes of aerobic exercise at least 4 times per week.  Losing weight if necessary.  Not smoking.  Limiting alcoholic beverages.  Learning ways to reduce stress. Your health care provider may prescribe medicine if lifestyle changes are not enough to get your blood pressure under control, and if one of the following is true:  You are 18-59 years of age and your systolic blood pressure is above 140.  You are 60 years of age or older, and your systolic blood pressure is above 150.  Your diastolic blood pressure is above 90.  You have diabetes, and your systolic blood pressure is over 140 or your diastolic blood pressure is over 90.  You have kidney disease and your blood pressure is above 140/90.  You have heart disease and your blood pressure is above 140/90. Your personal target blood pressure may vary depending on your medical conditions, your age, and other factors. HOME CARE INSTRUCTIONS    Have your blood pressure rechecked as directed by your health care provider.   Take medicines only as directed by your health care provider. Follow the directions carefully. Blood pressure medicines must be taken as prescribed. The medicine does not work as well when you skip doses. Skipping doses also puts you at risk for  problems.  Do not smoke.   Monitor your blood pressure at home as directed by your health care provider. SEEK MEDICAL CARE IF:   You think you are having a reaction to medicines taken.  You have recurrent headaches or feel dizzy.  You have swelling in your ankles.  You have trouble with your vision. SEEK IMMEDIATE MEDICAL CARE IF:  You develop a severe headache or confusion.  You have unusual weakness, numbness, or feel faint.  You have severe chest or abdominal pain.  You vomit repeatedly.  You have trouble breathing. MAKE SURE YOU:   Understand these instructions.  Will watch your condition.  Will get help right away if you are not doing well or get worse.   This information is not intended to replace advice given to you by your health care provider. Make sure you discuss any questions you have with your health care provider.   Document Released: 09/23/2005 Document Revised: 02/07/2015 Document Reviewed: 07/16/2013 Elsevier Interactive Patient Education 2016 [employer]  

## 2016-01-16 NOTE — Progress Notes (Signed)
Pre visit review using our clinic review tool, if applicable. No additional management support is needed unless otherwise documented below in the visit note. 

## 2016-01-16 NOTE — Telephone Encounter (Signed)
Medication filled to pharmacy as requested.   

## 2016-01-16 NOTE — Progress Notes (Signed)
Patient ID: Tracey Page, female    DOB: 08/07/1956  Age: 60 y.o. MRN: [id number]    Subjective:  Subjective HPI Tekesha KATHALINE COCHENOUR presents for f/u leg wound.  It is healing well.    Review of Systems  Constitutional: Negative for fever, diaphoresis, appetite change, fatigue and unexpected weight change.  HENT: Positive for rhinorrhea and sinus pressure.   Eyes: Negative for pain, redness and visual disturbance.  Respiratory: Negative for cough, chest tightness, shortness of breath and wheezing.   Cardiovascular: Negative for chest pain, palpitations and leg swelling.  Endocrine: Negative for cold intolerance, heat intolerance, polydipsia, polyphagia and polyuria.  Genitourinary: Negative for dysuria, frequency and difficulty urinating.  Allergic/Immunologic: Negative for environmental allergies.  Neurological: Negative for dizziness, light-headedness, numbness and headaches.    History Past Medical History  Diagnosis Date  . Hypertension   . Thyroid disease   . Hyperlipidemia     She has past surgical history that includes Ectopic pregnancy surgery.   Her family history includes Aneurysm in her father; Arthritis in her mother; Diabetes in her maternal grandfather.She reports that she has quit smoking. She has never used smokeless tobacco. She reports that she drinks alcohol. She reports that she does not use illicit drugs.  Current Outpatient Prescriptions on File Prior to Visit  Medication Sig Dispense Refill  . metoprolol succinate (TOPROL-XL) 100 MG 24 hr tablet TAKE 1/2 TABLET BY MOUTH EVERY MORNING AND TAKE 1 TABLET BY MOUTH EVERY EVENING 45 tablet 5  . potassium chloride (K-DUR) 10 MEQ tablet TAKE ONE TABLET BY MOUTH ONCE DAILY 90 tablet 1  . RABEprazole (ACIPHEX) 20 MG tablet Take 1 tablet (20 mg total) by mouth daily. 90 tablet 1  . levothyroxine (SYNTHROID, LEVOTHROID) 125 MCG tablet Take 1 tablet (125 mcg total) by mouth daily before breakfast. (Patient not taking:  Reported on 01/16/2016) 90 tablet 3   No current facility-administered medications on file prior to visit.     Objective:  Objective Physical Exam  Constitutional: She is oriented to person, place, and time. She appears well-developed and well-nourished.  HENT:  Head: Normocephalic and atraumatic.  Eyes: Conjunctivae and EOM are normal.  Neck: Normal range of motion. Neck supple. No JVD present. Carotid bruit is not present. No thyromegaly present.  Cardiovascular: Normal rate, regular rhythm and normal heart sounds.   No murmur heard. Pulmonary/Chest: Effort normal and breath sounds normal. No respiratory distress. She has no wheezes. She has no rales. She exhibits no tenderness.  Musculoskeletal: She exhibits no edema.  Neurological: She is alert and oriented to person, place, and time.  Skin: Skin is warm and dry. No rash noted. No erythema. No pallor.  Psychiatric: She has a normal mood and affect.  Nursing note and vitals reviewed.  BP 134/68 mmHg  Pulse 74  Temp(Src) 98.2 F (36.8 C) (Oral)  Ht 5\' 5"  (1.651 m)  Wt 301 lb (136.533 kg)  BMI 50.09 kg/m2  SpO2 98% Wt Readings from Last 3 Encounters:  01/16/16 301 lb (136.533 kg)  10/03/15 299 lb 12.8 oz (135.988 kg)  09/19/15 298 lb 12.8 oz (135.535 kg)     Lab Results  Component Value Date   WBC 4.6 07/14/2015   HGB 13.3 07/14/2015   HCT 40.1 07/14/2015   PLT 236.0 07/14/2015   GLUCOSE 110* 07/14/2015   CHOL 149 07/14/2015   TRIG 81.0 07/14/2015   HDL 36.40* 07/14/2015   LDLDIRECT 179.1 09/16/2012   LDLCALC 97 07/14/2015   ALT 16  07/14/2015   AST 14 07/14/2015   NA 140 07/14/2015   K 3.6 07/14/2015   CL 103 07/14/2015   CREATININE 0.75 07/14/2015   BUN 9 07/14/2015   CO2 32 07/14/2015   TSH 1.186 10/10/2015   MICROALBUR 8.2* 07/14/2015    No results found.   Assessment & Plan:  Plan I have discontinued Ms. Tippens's levofloxacin. I have also changed her amLODipine. Additionally, I am having her  maintain her metoprolol succinate, RABEprazole, levothyroxine, potassium chloride, atorvastatin, valsartan, thyroid, furosemide, and ALPRAZolam.  Meds ordered this encounter  Medications  . thyroid (ARMOUR) 60 MG tablet    Sig: Take 60 mg by mouth 2 (two) times daily. Patient taking twice daily but on Sunday once daily  . furosemide (LASIX) 20 MG tablet    Sig: 1-2 po qd edema    Dispense:  60 tablet    Refill:  3  . amLODipine (NORVASC) 10 MG tablet    Sig: Take 1 tablet (10 mg total) by mouth daily.    Dispense:  90 tablet    Refill:  1  . ALPRAZolam (XANAX) 0.25 MG tablet    Sig: Take 1 tablet (0.25 mg total) by mouth 2 (two) times daily as needed for anxiety.    Dispense:  45 tablet    Refill:  2    Problem List Items Addressed This Visit      Unprioritized   Hyperlipidemia - Primary   Relevant Medications   furosemide (LASIX) 20 MG tablet   amLODipine (NORVASC) 10 MG tablet   Other Relevant Orders   Comprehensive metabolic panel   Lipid panel   Essential hypertension   Relevant Medications   furosemide (LASIX) 20 MG tablet   amLODipine (NORVASC) 10 MG tablet   Other Relevant Orders   Comprehensive metabolic panel   Lipid panel   EDEMA   Relevant Medications   furosemide (LASIX) 20 MG tablet   Cellulitis of right lower leg    Resolved       Other Visit Diagnoses    Leg wound, right, subsequent encounter        Depression with anxiety        Relevant Medications    ALPRAZolam (XANAX) 0.25 MG tablet       Follow-up: Return in about 6 months (around 07/17/2016), or if symptoms worsen or fail to improve, for annual exam, fasting.  Ann Held, DO

## 2016-01-18 ENCOUNTER — Ambulatory Visit: Payer: Self-pay

## 2016-01-25 ENCOUNTER — Ambulatory Visit: Payer: Self-pay

## 2016-01-31 ENCOUNTER — Other Ambulatory Visit: Payer: Self-pay | Admitting: Family Medicine

## 2016-02-13 ENCOUNTER — Ambulatory Visit: Payer: Self-pay

## 2016-02-20 ENCOUNTER — Ambulatory Visit: Payer: Self-pay

## 2016-02-22 ENCOUNTER — Ambulatory Visit: Payer: Self-pay

## 2016-02-27 ENCOUNTER — Ambulatory Visit: Payer: Self-pay

## 2016-02-29 ENCOUNTER — Ambulatory Visit: Payer: Self-pay

## 2016-03-07 ENCOUNTER — Ambulatory Visit: Payer: Self-pay

## 2016-04-16 ENCOUNTER — Other Ambulatory Visit: Payer: Self-pay | Admitting: Family Medicine

## 2016-05-10 ENCOUNTER — Other Ambulatory Visit: Payer: Self-pay | Admitting: Family Medicine

## 2016-07-19 ENCOUNTER — Telehealth: Payer: Self-pay | Admitting: Family Medicine

## 2016-07-19 NOTE — Telephone Encounter (Signed)
Left message on patients voicemail @ [phone] informing patient that a My Chart message had been sent on 9/21 and that Dr. Carollee Herter will be out of the office on 10/19. Asked patient to call our office to reschedule appointment.

## 2016-07-25 ENCOUNTER — Encounter: Payer: Self-pay | Admitting: Family Medicine

## 2016-08-06 ENCOUNTER — Telehealth: Payer: Self-pay | Admitting: Family Medicine

## 2016-08-06 ENCOUNTER — Encounter: Payer: Self-pay | Admitting: Family Medicine

## 2016-08-06 NOTE — Telephone Encounter (Signed)
Mailed letter and sent patient a My Chart message informing that Dr. Carollee Herter will be out of the office September 23, 2016 and we need to reschedule her appointment. Appointment cancelled.

## 2016-08-14 ENCOUNTER — Telehealth: Payer: Self-pay | Admitting: Family Medicine

## 2016-08-14 NOTE — Telephone Encounter (Signed)
Error

## 2016-08-25 ENCOUNTER — Other Ambulatory Visit: Payer: Self-pay | Admitting: Family Medicine

## 2016-08-26 NOTE — Telephone Encounter (Signed)
Refill sent per [facility] refill protocol/SLS  

## 2016-09-09 ENCOUNTER — Encounter: Payer: Self-pay | Admitting: Family Medicine

## 2016-09-09 ENCOUNTER — Ambulatory Visit (INDEPENDENT_AMBULATORY_CARE_PROVIDER_SITE_OTHER): Payer: Managed Care, Other (non HMO) | Admitting: Family Medicine

## 2016-09-09 VITALS — BP 122/74 | HR 57 | Temp 97.7°F | Resp 16 | Ht 66.0 in | Wt 312.6 lb

## 2016-09-09 DIAGNOSIS — R601 Generalized edema: Secondary | ICD-10-CM | POA: Diagnosis not present

## 2016-09-09 DIAGNOSIS — E785 Hyperlipidemia, unspecified: Secondary | ICD-10-CM | POA: Diagnosis not present

## 2016-09-09 DIAGNOSIS — Z0000 Encounter for general adult medical examination without abnormal findings: Secondary | ICD-10-CM

## 2016-09-09 DIAGNOSIS — I10 Essential (primary) hypertension: Secondary | ICD-10-CM

## 2016-09-09 LAB — POCT URINALYSIS DIPSTICK
GLUCOSE UA: NEGATIVE
Ketones, UA: NEGATIVE
Leukocytes, UA: NEGATIVE
NITRITE UA: NEGATIVE
PH UA: 6
RBC UA: NEGATIVE
UROBILINOGEN UA: 0.2

## 2016-09-09 MED ORDER — METOPROLOL SUCCINATE ER 50 MG PO TB24: ORAL_TABLET | ORAL | 1 refills | Status: DC

## 2016-09-09 MED FILL — METOPROLOL SUCC ER 50 MG TA: 50 | 30 days supply | Qty: 90 | Fill #0

## 2016-09-09 NOTE — Progress Notes (Signed)
Subjective:     Tracey Page is a 60 y.o. female and is here for a comprehensive physical exam. The patient reports no problems.  Social History   Social History  . Marital status: Single    Spouse name: N/A  . Number of children: N/A  . Years of education: N/A   Occupational History  . Not on file.   Social History Main Topics  . Smoking status: Former Research scientist (life sciences)  . Smokeless tobacco: Never Used  . Alcohol use Yes     Comment: socially, not even on weekly basis  . Drug use: No  . Sexual activity: Yes    Birth control/ protection: Post-menopausal   Other Topics Concern  . Not on file   Social History Narrative  . No narrative on file   Health Maintenance  Topic Date Due  . ZOSTAVAX  01/20/2016  . INFLUENZA VACCINE  09/09/2017 (Originally 05/07/2016)  . MAMMOGRAM  07/23/2017  . COLONOSCOPY  06/17/2018  . PAP SMEAR  07/25/2019  . TETANUS/TDAP  04/20/2024  . Hepatitis C Screening  Completed  . HIV Screening  Completed    The following portions of the patient's history were reviewed and updated as appropriate: She  has a past medical history of Hyperlipidemia; Hypertension; and Thyroid disease. She  does not have any pertinent problems on file. She  has a past surgical history that includes Ectopic pregnancy surgery. Her family history includes Aneurysm in her father; Arthritis in her mother; Diabetes in her maternal grandfather. She  reports that she has quit smoking. She has never used smokeless tobacco. She reports that she drinks alcohol. She reports that she does not use drugs. She has a current medication list which includes the following prescription(s): amlodipine, atorvastatin, furosemide, potassium chloride, rabeprazole, thyroid, alprazolam, and metoprolol succinate. Current Outpatient Prescriptions on File Prior to Visit  Medication Sig Dispense Refill  . potassium chloride (K-DUR) 10 MEQ tablet TAKE ONE TABLET BY MOUTH ONCE DAILY 90 tablet 1  . RABEprazole  (ACIPHEX) 20 MG tablet Take 1 tablet (20 mg total) by mouth daily. 90 tablet 1  . thyroid (ARMOUR) 60 MG tablet Take 60 mg by mouth 2 (two) times daily. Patient taking twice daily but on Sunday once daily    . ALPRAZolam (XANAX) 0.25 MG tablet Take 1 tablet (0.25 mg total) by mouth 2 (two) times daily as needed for anxiety. (Patient not taking: Reported on 09/09/2016) 45 tablet 2   No current facility-administered medications on file prior to visit.    She is allergic to diovan [valsartan]; iodine; shellfish allergy; and sulfonamide derivatives..  Review of Systems Review of Systems  Constitutional: Negative for activity change, appetite change and fatigue.  HENT: Negative for hearing loss, congestion, tinnitus and ear discharge.  dentist q91m Eyes: Negative for visual disturbance (see optho q1y -- vision corrected to 20/20 with glasses).  Respiratory: Negative for cough, chest tightness and shortness of breath.   Cardiovascular: Negative for chest pain, palpitations and leg swelling.  Gastrointestinal: Negative for abdominal pain, diarrhea, constipation and abdominal distention.  Genitourinary: Negative for urgency, frequency, decreased urine volume and difficulty urinating.  Musculoskeletal: Negative for back pain, arthralgias and gait problem.  Skin: Negative for color change, pallor and rash.  Neurological: Negative for dizziness, light-headedness, numbness and headaches.  Hematological: Negative for adenopathy. Does not bruise/bleed easily.  Psychiatric/Behavioral: Negative for suicidal ideas, confusion, sleep disturbance, self-injury, dysphoric mood, decreased concentration and agitation.       Objective:  BP 122/74 (BP Location: Left Arm, Patient Position: Sitting, Cuff Size: Large)   Pulse (!) 57   Temp 97.7 F (36.5 C) (Oral)   Resp 16   Ht 5\' 6"  (1.676 m)   Wt (!) 312 lb 9.6 oz (141.8 kg)   SpO2 98%   BMI 50.45 kg/m  General appearance: alert, cooperative, appears  stated age and icteric Head: Normocephalic, without obvious abnormality, atraumatic Eyes: conjunctivae/corneas clear. PERRL, EOM's intact. Fundi benign. Ears: normal TM's and external ear canals both ears Nose: Nares normal. Septum midline. Mucosa normal. No drainage or sinus tenderness. Throat: lips, mucosa, and tongue normal; teeth and gums normal Neck: no adenopathy, no carotid bruit, no JVD, supple, symmetrical, trachea midline and thyroid not enlarged, symmetric, no tenderness/mass/nodules Back: symmetric, no curvature. ROM normal. No CVA tenderness. Lungs: clear to auscultation bilaterally Breasts: normal appearance, no masses or tenderness Heart: regular rate and rhythm, S1, S2 normal, no murmur, click, rub or gallop Abdomen: soft, non-tender; bowel sounds normal; no masses,  no organomegaly Pelvic: deferred Extremities: extremities normal, atraumatic, no cyanosis or edema Pulses: 2+ and symmetric Skin: Skin color, texture, turgor normal. No rashes or lesions Lymph nodes: Cervical, supraclavicular, and axillary nodes normal. Neurologic: Alert and oriented X 3, normal strength and tone. Normal symmetric reflexes. Normal coordination and gait    Assessment:    Healthy female exam.      Plan:    ghm utd Check labs See After Visit Summary for Counseling Recommendations    1. Essential hypertension Stable, con't meds  - metoprolol succinate (TOPROL-XL) 50 MG 24 hr tablet; 3 po qd Take with or immediately following a meal.  Dispense: 90 tablet; Refill: 1 - Comprehensive metabolic panel - Lipid panel - CBC with Differential/Platelet - POCT urinalysis dipstick - amLODipine (NORVASC) 10 MG tablet; Take 1 tablet (10 mg total) by mouth daily.  Dispense: 90 tablet; Refill: 1 - furosemide (LASIX) 20 MG tablet; 1-2 po qd edema  Dispense: 60 tablet; Refill: 3  2. Preventative health care See above - Comprehensive metabolic panel - Lipid panel - CBC with Differential/Platelet -  POCT urinalysis dipstick  3. Generalized edema Elevated legs, watch salt in diet Compression socks/ stocking - furosemide (LASIX) 20 MG tablet; 1-2 po qd edema  Dispense: 60 tablet; Refill: 3  4. Hyperlipidemia LDL goal <100 Check labs  - atorvastatin (LIPITOR) 20 MG tablet; Take 1 tablet (20 mg total) by mouth daily.  Dispense: 90 tablet; Refill: 0

## 2016-09-09 NOTE — Progress Notes (Signed)
Pre visit review using our clinic review tool, if applicable. No additional management support is needed unless otherwise documented below in the visit note. 

## 2016-09-09 NOTE — Patient Instructions (Signed)

## 2016-09-10 LAB — CBC WITH DIFFERENTIAL/PLATELET
BASOS PCT: 0.4 % (ref 0.0–3.0)
Basophils Absolute: 0 10*3/uL (ref 0.0–0.1)
EOS ABS: 0.1 10*3/uL (ref 0.0–0.7)
EOS PCT: 0.9 % (ref 0.0–5.0)
HEMATOCRIT: 41.2 % (ref 36.0–46.0)
HEMOGLOBIN: 13.8 g/dL (ref 12.0–15.0)
LYMPHS PCT: 34 % (ref 12.0–46.0)
Lymphs Abs: 2.3 10*3/uL (ref 0.7–4.0)
MCHC: 33.4 g/dL (ref 30.0–36.0)
MCV: 88.7 fl (ref 78.0–100.0)
Monocytes Absolute: 0.3 10*3/uL (ref 0.1–1.0)
Monocytes Relative: 4.9 % (ref 3.0–12.0)
Neutro Abs: 4 10*3/uL (ref 1.4–7.7)
Neutrophils Relative %: 59.8 % (ref 43.0–77.0)
Platelets: 257 10*3/uL (ref 150.0–400.0)
RBC: 4.64 Mil/uL (ref 3.87–5.11)
RDW: 15.8 % — AB (ref 11.5–15.5)
WBC: 6.7 10*3/uL (ref 4.0–10.5)

## 2016-09-10 LAB — LIPID PANEL
CHOLESTEROL: 180 mg/dL (ref 0–200)
HDL: 44.1 mg/dL (ref >39.00)
LDL Cholesterol: 115 mg/dL — ABNORMAL HIGH (ref 0–99)
NonHDL: 135.88
Total CHOL/HDL Ratio: 4
Triglycerides: 102 mg/dL (ref 0.0–149.0)
VLDL: 20.4 mg/dL (ref 0.0–40.0)

## 2016-09-10 LAB — COMPREHENSIVE METABOLIC PANEL
ALBUMIN: 4.1 g/dL (ref 3.5–5.2)
ALK PHOS: 93 U/L (ref 39–117)
ALT: 27 U/L (ref 0–35)
AST: 19 U/L (ref 0–37)
BUN: 17 mg/dL (ref 6–23)
CO2: 28 mEq/L (ref 19–32)
CREATININE: 0.73 mg/dL (ref 0.40–1.20)
Calcium: 9.1 mg/dL (ref 8.4–10.5)
Chloride: 103 mEq/L (ref 96–112)
GFR: 104.36 mL/min (ref >60.00)
Glucose, Bld: 82 mg/dL (ref 70–99)
Potassium: 3.5 mEq/L (ref 3.5–5.1)
SODIUM: 141 meq/L (ref 135–145)
TOTAL PROTEIN: 7.7 g/dL (ref 6.0–8.3)
Total Bilirubin: 0.4 mg/dL (ref 0.2–1.2)

## 2016-09-13 ENCOUNTER — Other Ambulatory Visit: Payer: Self-pay | Admitting: Family Medicine

## 2016-09-13 ENCOUNTER — Telehealth: Payer: Self-pay | Admitting: Family Medicine

## 2016-09-13 DIAGNOSIS — E039 Hypothyroidism, unspecified: Secondary | ICD-10-CM

## 2016-09-13 NOTE — Telephone Encounter (Signed)
In chart for the metoprolol is 3 tabs per day.  Is that correct?  Also the levothyroxine was taken off her medication list and the pharmacy sent a request to refill.  Would you like to refill that?

## 2016-09-13 NOTE — Telephone Encounter (Signed)
See phone message

## 2016-09-13 NOTE — Telephone Encounter (Signed)
Caller name: Relationship to patient: Self Can be reached: [phone] Pharmacy:  Reason for call: Patient request call back to clarify what dosage of metoprolol succinate

## 2016-09-13 NOTE — Telephone Encounter (Signed)
Yes ----- its the easiest way to get 150 mg , take 3 50 mg Will need to talk to pt about synthroid--- don't know why it would have been taken off

## 2016-09-14 ENCOUNTER — Encounter: Payer: Self-pay | Admitting: Family Medicine

## 2016-09-14 MED ORDER — AMLODIPINE BESYLATE 10 MG PO TABS: 10.0000 mg | ORAL_TABLET | Freq: Every day | ORAL | 1 refills | Status: DC

## 2016-09-14 MED ORDER — FUROSEMIDE 20 MG PO TABS: ORAL_TABLET | ORAL | 3 refills | Status: DC

## 2016-09-14 MED ORDER — ATORVASTATIN CALCIUM 20 MG PO TABS: 20.0000 mg | ORAL_TABLET | Freq: Every day | ORAL | 0 refills | Status: DC

## 2016-09-16 NOTE — Telephone Encounter (Signed)
Called patient left message on answering machine for patient to take 3  50 mg tablets per Dr. Etter Sjogren.

## 2016-09-16 NOTE — Progress Notes (Signed)
Letter has been sent to patient.  PC

## 2016-09-17 ENCOUNTER — Other Ambulatory Visit: Payer: Self-pay | Admitting: Family Medicine

## 2016-09-17 DIAGNOSIS — E039 Hypothyroidism, unspecified: Secondary | ICD-10-CM

## 2016-09-23 ENCOUNTER — Encounter: Payer: Self-pay | Admitting: Family Medicine

## 2016-10-09 MED FILL — METOPROLOL SUCC ER 50 MG TA: 50 | 30 days supply | Qty: 90 | Fill #1

## 2016-10-11 ENCOUNTER — Ambulatory Visit (INDEPENDENT_AMBULATORY_CARE_PROVIDER_SITE_OTHER): Payer: Managed Care, Other (non HMO) | Admitting: Family Medicine

## 2016-10-11 ENCOUNTER — Encounter: Payer: Self-pay | Admitting: Family Medicine

## 2016-10-11 VITALS — BP 116/82 | HR 54 | Temp 97.8°F | Ht 65.5 in | Wt 314.2 lb

## 2016-10-11 DIAGNOSIS — L03115 Cellulitis of right lower limb: Secondary | ICD-10-CM | POA: Diagnosis not present

## 2016-10-11 DIAGNOSIS — I10 Essential (primary) hypertension: Secondary | ICD-10-CM | POA: Diagnosis not present

## 2016-10-11 MED ORDER — AMLODIPINE BESYLATE 5 MG PO TABS: 5.0000 mg | ORAL_TABLET | Freq: Every day | ORAL | 3 refills | Status: DC

## 2016-10-11 MED ORDER — LEVOFLOXACIN 500 MG PO TABS: 500.0000 mg | ORAL_TABLET | Freq: Every day | ORAL | 0 refills | Status: DC

## 2016-10-11 NOTE — Patient Instructions (Signed)

## 2016-10-11 NOTE — Progress Notes (Signed)
Pre visit review using our clinic review tool, if applicable. No additional management support is needed unless otherwise documented below in the visit note. 

## 2016-10-11 NOTE — Progress Notes (Signed)
Subjective:    Patient ID: Tracey Page, female    DOB: 27-Aug-1956, 61 y.o.   MRN: [id number]  Chief Complaint  Patient presents with  . Follow-up    Pt here for 4 week f/u. Pt was taken off Valsartan last visit and is checking bp at home.     HPI Patient is in today for blood pressure follow up.   Pt only complaint is another wound on r low leg.  She walked into something that caused it.  Pt has been putting neosporin on it with guaze.   No other complaints.    Past Medical History:  Diagnosis Date  . Hyperlipidemia   . Hypertension   . Thyroid disease     Past Surgical History:  Procedure Laterality Date  . ECTOPIC PREGNANCY SURGERY      Family History  Problem Relation Age of Onset  . Hyperlipidemia    . Hypertension    . Diabetes Maternal Grandfather   . Coronary artery disease    . Arthritis Mother   . Aneurysm Father     Social History   Social History  . Marital status: Single    Spouse name: N/A  . Number of children: N/A  . Years of education: N/A   Occupational History  . Not on file.   Social History Main Topics  . Smoking status: Former Research scientist (life sciences)  . Smokeless tobacco: Never Used  . Alcohol use Yes     Comment: socially, not even on weekly basis  . Drug use: No  . Sexual activity: Yes    Birth control/ protection: Post-menopausal   Other Topics Concern  . Not on file   Social History Narrative  . No narrative on file    Outpatient Medications Prior to Visit  Medication Sig Dispense Refill  . ALPRAZolam (XANAX) 0.25 MG tablet Take 1 tablet (0.25 mg total) by mouth 2 (two) times daily as needed for anxiety. 45 tablet 2  . atorvastatin (LIPITOR) 20 MG tablet Take 1 tablet (20 mg total) by mouth daily. 90 tablet 0  . furosemide (LASIX) 20 MG tablet 1-2 po qd edema 60 tablet 3  . metoprolol succinate (TOPROL-XL) 50 MG 24 hr tablet 3 po qd Take with or immediately following a meal. 90 tablet 1  . potassium chloride (K-DUR) 10 MEQ tablet TAKE  ONE TABLET BY MOUTH ONCE DAILY 90 tablet 1  . RABEprazole (ACIPHEX) 20 MG tablet Take 1 tablet (20 mg total) by mouth daily. 90 tablet 1  . SYNTHROID 125 MCG tablet TAKE ONE TABLET BY MOUTH ONCE DAILY BEFORE BREAKFAST 30 tablet 11  . amLODipine (NORVASC) 10 MG tablet Take 1 tablet (10 mg total) by mouth daily. 90 tablet 1  . thyroid (ARMOUR) 60 MG tablet Take 60 mg by mouth 2 (two) times daily. Patient taking twice daily but on Sunday once daily     No facility-administered medications prior to visit.     Allergies  Allergen Reactions  . Diovan [Valsartan] Other (See Comments)    angioedema  . Iodine   . Shellfish Allergy   . Sulfonamide Derivatives     Review of Systems  Constitutional: Negative for fever and malaise/fatigue.  HENT: Negative for congestion.   Eyes: Negative for blurred vision.  Respiratory: Negative for cough and shortness of breath.   Cardiovascular: Negative for chest pain, palpitations and leg swelling.  Gastrointestinal: Negative for vomiting.  Musculoskeletal: Negative for back pain.  Skin: Negative for rash.  Neurological:  Negative for loss of consciousness and headaches.       Objective:    Physical Exam  Constitutional: She is oriented to person, place, and time. She appears well-developed and well-nourished. No distress.  HENT:  Head: Normocephalic and atraumatic.  Eyes: Conjunctivae are normal.  Neck: Normal range of motion. No thyromegaly present.  Cardiovascular: Normal rate and regular rhythm.   Pulmonary/Chest: Effort normal and breath sounds normal. She has no wheezes.  Abdominal: Soft. Bowel sounds are normal. There is no tenderness.  Musculoskeletal: Normal range of motion. She exhibits no edema or deformity.  Neurological: She is alert and oriented to person, place, and time.  Skin: Skin is warm and dry. She is not diaphoretic.     Psychiatric: She has a normal mood and affect.    BP 116/82   Pulse (!) 54   Temp 97.8 F (36.6 C)  (Oral)   Ht 5' 5.5" (1.664 m)   Wt (!) 314 lb 3.2 oz (142.5 kg)   SpO2 97%   BMI 51.49 kg/m  Wt Readings from Last 3 Encounters:  10/11/16 (!) 314 lb 3.2 oz (142.5 kg)  09/09/16 (!) 312 lb 9.6 oz (141.8 kg)  01/16/16 (!) 301 lb (136.5 kg)     Lab Results  Component Value Date   WBC 6.7 09/09/2016   HGB 13.8 09/09/2016   HCT 41.2 09/09/2016   PLT 257.0 09/09/2016   GLUCOSE 82 09/09/2016   CHOL 180 09/09/2016   TRIG 102.0 09/09/2016   HDL 44.10 09/09/2016   LDLDIRECT 179.1 09/16/2012   LDLCALC 115 (H) 09/09/2016   ALT 27 09/09/2016   AST 19 09/09/2016   NA 141 09/09/2016   K 3.5 09/09/2016   CL 103 09/09/2016   CREATININE 0.73 09/09/2016   BUN 17 09/09/2016   CO2 28 09/09/2016   TSH 1.186 10/10/2015   MICROALBUR 8.2 (H) 07/14/2015    Lab Results  Component Value Date   TSH 1.186 10/10/2015   Lab Results  Component Value Date   WBC 6.7 09/09/2016   HGB 13.8 09/09/2016   HCT 41.2 09/09/2016   MCV 88.7 09/09/2016   PLT 257.0 09/09/2016   Lab Results  Component Value Date   NA 141 09/09/2016   K 3.5 09/09/2016   CO2 28 09/09/2016   GLUCOSE 82 09/09/2016   BUN 17 09/09/2016   CREATININE 0.73 09/09/2016   BILITOT 0.4 09/09/2016   ALKPHOS 93 09/09/2016   AST 19 09/09/2016   ALT 27 09/09/2016   PROT 7.7 09/09/2016   ALBUMIN 4.1 09/09/2016   CALCIUM 9.1 09/09/2016   GFR 104.36 09/09/2016   Lab Results  Component Value Date   CHOL 180 09/09/2016   Lab Results  Component Value Date   HDL 44.10 09/09/2016   Lab Results  Component Value Date   LDLCALC 115 (H) 09/09/2016   Lab Results  Component Value Date   TRIG 102.0 09/09/2016   Lab Results  Component Value Date   CHOLHDL 4 09/09/2016   No results found for: HGBA1C     Assessment & Plan:   Problem List Items Addressed This Visit      Unprioritized   Essential hypertension - Primary    Stable Lower norvasc to 5 mg and con't other meds rto 3 months      Relevant Medications    amLODipine (NORVASC) 5 MG tablet      I have discontinued Ms. Cassity's thyroid and amLODipine. I am also having her start on  amLODipine and levofloxacin. Additionally, I am having her maintain her RABEprazole, potassium chloride, ALPRAZolam, metoprolol succinate, atorvastatin, furosemide, and SYNTHROID.  Meds ordered this encounter  Medications  . amLODipine (NORVASC) 5 MG tablet    Sig: Take 1 tablet (5 mg total) by mouth daily.    Dispense:  90 tablet    Refill:  3  . levofloxacin (LEVAQUIN) 500 MG tablet    Sig: Take 1 tablet (500 mg total) by mouth daily.    Dispense:  7 tablet    Refill:  0    CMA served as scribe during this visit. History, Physical and Plan performed by medical provider. Documentation and orders reviewed and attested to.   Ann Held, DO

## 2016-10-12 NOTE — Assessment & Plan Note (Signed)
Stable Lower norvasc to 5 mg and con't other meds rto 3 months

## 2016-10-12 NOTE — Assessment & Plan Note (Signed)
Start abx-- levaquin

## 2016-11-24 ENCOUNTER — Other Ambulatory Visit: Payer: Self-pay | Admitting: Family Medicine

## 2016-11-29 ENCOUNTER — Other Ambulatory Visit: Payer: Self-pay | Admitting: Family Medicine

## 2016-11-29 DIAGNOSIS — I10 Essential (primary) hypertension: Secondary | ICD-10-CM

## 2016-11-29 MED FILL — METOPROLOL SUCC ER 50 MG TA: 50 | 30 days supply | Qty: 90 | Fill #0

## 2016-12-27 ENCOUNTER — Other Ambulatory Visit: Payer: Self-pay | Admitting: Family Medicine

## 2016-12-27 DIAGNOSIS — R601 Generalized edema: Secondary | ICD-10-CM

## 2016-12-27 DIAGNOSIS — I10 Essential (primary) hypertension: Secondary | ICD-10-CM

## 2016-12-30 MED FILL — METOPROLOL SUCC ER 50 MG TA: 50 | 30 days supply | Qty: 90 | Fill #1

## 2017-01-10 ENCOUNTER — Ambulatory Visit: Payer: Self-pay | Admitting: Family Medicine

## 2017-01-28 ENCOUNTER — Other Ambulatory Visit: Payer: Self-pay | Admitting: Family Medicine

## 2017-01-28 DIAGNOSIS — I10 Essential (primary) hypertension: Secondary | ICD-10-CM

## 2017-01-28 MED FILL — METOPROLOL SUCC ER 50 MG TA: 50 | 30 days supply | Qty: 90 | Fill #0

## 2017-02-20 ENCOUNTER — Other Ambulatory Visit: Payer: Self-pay | Admitting: Family Medicine

## 2017-02-27 ENCOUNTER — Ambulatory Visit (INDEPENDENT_AMBULATORY_CARE_PROVIDER_SITE_OTHER): Payer: 59 | Admitting: Family Medicine

## 2017-02-27 ENCOUNTER — Encounter: Payer: Self-pay | Admitting: Family Medicine

## 2017-02-27 VITALS — BP 138/86 | HR 54 | Temp 98.2°F | Resp 16 | Ht 66.0 in | Wt 315.4 lb

## 2017-02-27 DIAGNOSIS — L02419 Cutaneous abscess of limb, unspecified: Secondary | ICD-10-CM

## 2017-02-27 DIAGNOSIS — L03119 Cellulitis of unspecified part of limb: Secondary | ICD-10-CM

## 2017-02-27 DIAGNOSIS — R42 Dizziness and giddiness: Secondary | ICD-10-CM | POA: Diagnosis not present

## 2017-02-27 DIAGNOSIS — I10 Essential (primary) hypertension: Secondary | ICD-10-CM | POA: Diagnosis not present

## 2017-02-27 DIAGNOSIS — K219 Gastro-esophageal reflux disease without esophagitis: Secondary | ICD-10-CM | POA: Diagnosis not present

## 2017-02-27 MED ORDER — LEVOFLOXACIN 500 MG PO TABS: 500.0000 mg | ORAL_TABLET | Freq: Every day | ORAL | 0 refills | Status: DC

## 2017-02-27 MED ORDER — RABEPRAZOLE SODIUM 20 MG PO TBEC: 20.0000 mg | DELAYED_RELEASE_TABLET | Freq: Every day | ORAL | 1 refills | Status: DC

## 2017-02-27 MED ORDER — METOPROLOL SUCCINATE ER 50 MG PO TB24: ORAL_TABLET | ORAL | 1 refills | Status: DC

## 2017-02-27 NOTE — Progress Notes (Signed)
Patient ID: Tracey Page, female   DOB: 22-Feb-1956, 61 y.o.   MRN: [id number]    Subjective:  I acted as a Education administrator for Dr. Carollee Herter.  Guerry Bruin, [place]   Patient ID: Tracey Page, female    DOB: 1955/12/03, 61 y.o.   MRN: [id number]  Chief Complaint  Patient presents with  . Hypertension  . Cellulitis    right lower leg, she bumped it and now its sore    HPI  Patient is in today for follow up blood pressure and would like for her right lower leg to be looked at.  She bumped her right lower leg and now it is sore. Pt also c/o vertigo -- she has been feeling dizzy x few weeks   Usually occurs when she walks into the BR with "funky patterned " floor.  It also occurred while she was stopped at a lightl.  She felt like she kept moving  Patient Care Team: Carollee Herter, Alferd Apa, DO as PCP - General Newton Pigg, MD as Consulting Physician (Obstetrics and Gynecology)   Past Medical History:  Diagnosis Date  . Hyperlipidemia   . Hypertension   . Thyroid disease     Past Surgical History:  Procedure Laterality Date  . ECTOPIC PREGNANCY SURGERY      Family History  Problem Relation Age of Onset  . Hyperlipidemia Unknown   . Hypertension Unknown   . Diabetes Maternal Grandfather   . Coronary artery disease Unknown   . Arthritis Mother   . Aneurysm Father     Social History   Social History  . Marital status: Single    Spouse name: N/A  . Number of children: N/A  . Years of education: N/A   Occupational History  . Not on file.   Social History Main Topics  . Smoking status: Former Research scientist (life sciences)  . Smokeless tobacco: Never Used  . Alcohol use Yes     Comment: socially, not even on weekly basis  . Drug use: No  . Sexual activity: Yes    Birth control/ protection: Post-menopausal   Other Topics Concern  . Not on file   Social History Narrative  . No narrative on file    Outpatient Medications Prior to Visit  Medication Sig Dispense Refill  . ALPRAZolam (XANAX)  0.25 MG tablet Take 1 tablet (0.25 mg total) by mouth 2 (two) times daily as needed for anxiety. 45 tablet 2  . amLODipine (NORVASC) 5 MG tablet Take 1 tablet (5 mg total) by mouth daily. 90 tablet 3  . atorvastatin (LIPITOR) 20 MG tablet TAKE ONE TABLET BY MOUTH ONCE DAILY 90 tablet 0  . potassium chloride (K-DUR) 10 MEQ tablet TAKE ONE TABLET BY MOUTH ONCE DAILY 90 tablet 1  . SYNTHROID 125 MCG tablet TAKE ONE TABLET BY MOUTH ONCE DAILY BEFORE BREAKFAST 30 tablet 11  . furosemide (LASIX) 20 MG tablet 1-2 po qd edema 60 tablet 3  . metoprolol succinate (TOPROL-XL) 50 MG 24 hr tablet TAKE 3 TABLETS BY MOUTH DAILY IMMEDIATELY FOLLOWING A MEAL. 90 tablet 1  . RABEprazole (ACIPHEX) 20 MG tablet Take 1 tablet (20 mg total) by mouth daily. 90 tablet 1  . atorvastatin (LIPITOR) 20 MG tablet Take 1 tablet (20 mg total) by mouth daily. 90 tablet 0  . atorvastatin (LIPITOR) 20 MG tablet TAKE ONE TABLET BY MOUTH ONCE DAILY 90 tablet 0  . furosemide (LASIX) 20 MG tablet TAKE ONE TO TWO TABLETS BY MOUTH ONCE DAILY FOR EDEMA  60 tablet 3  . levofloxacin (LEVAQUIN) 500 MG tablet Take 1 tablet (500 mg total) by mouth daily. 7 tablet 0   No facility-administered medications prior to visit.     Allergies  Allergen Reactions  . Diovan [Valsartan] Other (See Comments)    angioedema  . Iodine   . Shellfish Allergy   . Sulfonamide Derivatives     Review of Systems  Constitutional: Negative for chills, fever and malaise/fatigue.  HENT: Negative for congestion and hearing loss.   Eyes: Negative for discharge.  Respiratory: Negative for cough, sputum production and shortness of breath.   Cardiovascular: Negative for chest pain, palpitations and leg swelling.  Gastrointestinal: Negative for abdominal pain, blood in stool, constipation, diarrhea, heartburn, nausea and vomiting.  Genitourinary: Negative for dysuria, frequency, hematuria and urgency.  Musculoskeletal: Negative for back pain, falls and myalgias.    Skin: Negative for rash.       Wound R low leg  Neurological: Negative for dizziness, sensory change, loss of consciousness, weakness and headaches.  Endo/Heme/Allergies: Negative for environmental allergies. Does not bruise/bleed easily.  Psychiatric/Behavioral: Negative for depression and suicidal ideas. The patient is not nervous/anxious and does not have insomnia.        Objective:    Physical Exam  Constitutional: She is oriented to person, place, and time. She appears well-developed and well-nourished.  HENT:  Head: Normocephalic and atraumatic.  Eyes: Conjunctivae and EOM are normal.  Neck: Normal range of motion. Neck supple. No JVD present. Carotid bruit is not present. No thyromegaly present.  Cardiovascular: Normal rate, regular rhythm and normal heart sounds.   No murmur heard. Pulmonary/Chest: Effort normal and breath sounds normal. No respiratory distress. She has no wheezes. She has no rales. She exhibits no tenderness.  Musculoskeletal: She exhibits no edema.  Neurological: She is alert and oriented to person, place, and time.  Skin: Rash noted. There is erythema.     Psychiatric: She has a normal mood and affect.  Nursing note and vitals reviewed.   BP 138/86 (BP Location: Left Arm, Cuff Size: Large)   Pulse (!) 54   Temp 98.2 F (36.8 C) (Oral)   Resp 16   Ht 5\' 6"  (1.676 m)   Wt (!) 315 lb 6.4 oz (143.1 kg)   SpO2 98%   BMI 50.91 kg/m  Wt Readings from Last 3 Encounters:  02/27/17 (!) 315 lb 6.4 oz (143.1 kg)  10/11/16 (!) 314 lb 3.2 oz (142.5 kg)  09/09/16 (!) 312 lb 9.6 oz (141.8 kg)   BP Readings from Last 3 Encounters:  02/27/17 138/86  10/11/16 116/82  09/09/16 122/74     Immunization History  Administered Date(s) Administered  . Td 12/14/2003  . Tdap 04/20/2014    Health Maintenance  Topic Date Due  . INFLUENZA VACCINE  09/09/2017 (Originally 05/07/2017)  . MAMMOGRAM  07/23/2017  . COLONOSCOPY  06/17/2018  . PAP SMEAR  07/25/2019   . TETANUS/TDAP  04/20/2024  . Hepatitis C Screening  Completed  . HIV Screening  Completed    Lab Results  Component Value Date   WBC 6.7 09/09/2016   HGB 13.8 09/09/2016   HCT 41.2 09/09/2016   PLT 257.0 09/09/2016   GLUCOSE 82 09/09/2016   CHOL 180 09/09/2016   TRIG 102.0 09/09/2016   HDL 44.10 09/09/2016   LDLDIRECT 179.1 09/16/2012   LDLCALC 115 (H) 09/09/2016   ALT 27 09/09/2016   AST 19 09/09/2016   NA 141 09/09/2016   K 3.5 09/09/2016  CL 103 09/09/2016   CREATININE 0.73 09/09/2016   BUN 17 09/09/2016   CO2 28 09/09/2016   TSH 1.186 10/10/2015   MICROALBUR 8.2 (H) 07/14/2015    Lab Results  Component Value Date   TSH 1.186 10/10/2015   Lab Results  Component Value Date   WBC 6.7 09/09/2016   HGB 13.8 09/09/2016   HCT 41.2 09/09/2016   MCV 88.7 09/09/2016   PLT 257.0 09/09/2016   Lab Results  Component Value Date   NA 141 09/09/2016   K 3.5 09/09/2016   CO2 28 09/09/2016   GLUCOSE 82 09/09/2016   BUN 17 09/09/2016   CREATININE 0.73 09/09/2016   BILITOT 0.4 09/09/2016   ALKPHOS 93 09/09/2016   AST 19 09/09/2016   ALT 27 09/09/2016   PROT 7.7 09/09/2016   ALBUMIN 4.1 09/09/2016   CALCIUM 9.1 09/09/2016   GFR 104.36 09/09/2016   Lab Results  Component Value Date   CHOL 180 09/09/2016   Lab Results  Component Value Date   HDL 44.10 09/09/2016   Lab Results  Component Value Date   LDLCALC 115 (H) 09/09/2016   Lab Results  Component Value Date   TRIG 102.0 09/09/2016   Lab Results  Component Value Date   CHOLHDL 4 09/09/2016   No results found for: HGBA1C     ekg-- sinus brady  Assessment & Plan:   Problem List Items Addressed This Visit      Unprioritized   Essential hypertension    Well controlled, no changes to meds. Encouraged heart healthy diet such as the DASH diet and exercise as tolerated.       Relevant Medications   metoprolol succinate (TOPROL-XL) 50 MG 24 hr tablet    Other Visit Diagnoses    Cellulitis and  abscess of leg    -  Primary   Relevant Medications   levofloxacin (LEVAQUIN) 500 MG tablet   Gastroesophageal reflux disease without esophagitis       Relevant Medications   RABEprazole (ACIPHEX) 20 MG tablet   Dizzy       Relevant Orders   EKG 12-Lead (Completed)      I have discontinued Ms. Dantonio's furosemide, levofloxacin, and furosemide. I am also having her start on levofloxacin. Additionally, I am having her maintain her potassium chloride, ALPRAZolam, SYNTHROID, amLODipine, atorvastatin, metoprolol succinate, and RABEprazole.  Meds ordered this encounter  Medications  . metoprolol succinate (TOPROL-XL) 50 MG 24 hr tablet    Sig: TAKE 3 TABLETS BY MOUTH DAILY IMMEDIATELY FOLLOWING A MEAL.    Dispense:  270 tablet    Refill:  1  . RABEprazole (ACIPHEX) 20 MG tablet    Sig: Take 1 tablet (20 mg total) by mouth daily.    Dispense:  90 tablet    Refill:  1  . levofloxacin (LEVAQUIN) 500 MG tablet    Sig: Take 1 tablet (500 mg total) by mouth daily.    Dispense:  10 tablet    Refill:  0    CMA served as scribe during this visit. History, Physical and Plan performed by medical provider. Documentation and orders reviewed and attested to.  Ann Held, DO

## 2017-02-27 NOTE — Patient Instructions (Addendum)
Cellulitis, Adult Cellulitis is a skin infection. The infected area is usually red and tender. This condition occurs most often in the arms and lower legs. The infection can travel to the muscles, blood, and underlying tissue and become serious. It is very important to get treated for this condition. What are the causes? Cellulitis is caused by bacteria. The bacteria enter through a break in the skin, such as a cut, burn, insect bite, open sore, or crack. What increases the risk? This condition is more likely to occur in people who:  Have a weak defense system (immune system).  Have open wounds on the skin such as cuts, burns, bites, and scrapes. Bacteria can enter the body through these open wounds.  Are older.  Have diabetes.  Have a type of long-lasting (chronic) liver disease (cirrhosis) or kidney disease.  Use IV drugs. What are the signs or symptoms? Symptoms of this condition include:  Redness, streaking, or spotting on the skin.  Swollen area of the skin.  Tenderness or pain when an area of the skin is touched.  Warm skin.  Fever.  Chills.  Blisters. How is this diagnosed? This condition is diagnosed based on a medical history and physical exam. You may also have tests, including:  Blood tests.  Lab tests.  Imaging tests. How is this treated? Treatment for this condition may include:  Medicines, such as antibiotic medicines or antihistamines.  Supportive care, such as rest and application of cold or warm cloths (cold or warm compresses) to the skin.  Hospital care, if the condition is severe. The infection usually gets better within 1-2 days of treatment. Follow these instructions at home:  Take over-the-counter and prescription medicines only as told by your health care provider.  If you were prescribed an antibiotic medicine, take it as told by your health care provider. Do not stop taking the antibiotic even if you start to feel better.  Drink  enough fluid to keep your urine clear or pale yellow.  Do not touch or rub the infected area.  Raise (elevate) the infected area above the level of your heart while you are sitting or lying down.  Apply warm or cold compresses to the affected area as told by your health care provider.  Keep all follow-up visits as told by your health care provider. This is important. These visits let your health care provider make sure a more serious infection is not developing. Contact a health care provider if:  You have a fever.  Your symptoms do not improve within 1-2 days of starting treatment.  Your bone or joint underneath the infected area becomes painful after the skin has healed.  Your infection returns in the same area or another area.  You notice a swollen bump in the infected area.  You develop new symptoms.  You have a general ill feeling (malaise) with muscle aches and pains. Get help right away if:  Your symptoms get worse.  You feel very sleepy.  You develop vomiting or diarrhea that persists.  You notice red streaks coming from the infected area.  Your red area gets larger or turns dark in color. This information is not intended to replace advice given to you by your health care provider. Make sure you discuss any questions you have with your health care provider. Document Released: 07/03/2005 Document Revised: 02/01/2016 Document Reviewed: 08/02/2015    How to Perform the Epley Maneuver The Epley maneuver is an exercise that relieves symptoms of vertigo. Vertigo is  the feeling that you or your surroundings are moving when they are not. When you feel vertigo, you may feel like the room is spinning and have trouble walking. Dizziness is a little different than vertigo. When you are dizzy, you may feel unsteady or light-headed. You can do this maneuver at home whenever you have symptoms of vertigo. You can do it up to 3 times a day until your symptoms go away. Even though  the Epley maneuver may relieve your vertigo for a few weeks, it is possible that your symptoms will return. This maneuver relieves vertigo, but it does not relieve dizziness. What are the risks? If it is done correctly, the Epley maneuver is considered safe. Sometimes it can lead to dizziness or nausea that goes away after a short time. If you develop other symptoms, such as changes in vision, weakness, or numbness, stop doing the maneuver and call your health care provider. How to perform the Epley maneuver 1. Sit on the edge of a bed or table with your back straight and your legs extended or hanging over the edge of the bed or table. 2. Turn your head halfway toward the affected ear or side. 3. Lie backward quickly with your head turned until you are lying flat on your back. You may want to position a pillow under your shoulders. 4. Hold this position for 30 seconds. You may experience an attack of vertigo. This is normal. 5. Turn your head to the opposite direction until your unaffected ear is facing the floor. 6. Hold this position for 30 seconds. You may experience an attack of vertigo. This is normal. Hold this position until the vertigo stops. 7. Turn your whole body to the same side as your head. Hold for another 30 seconds. 8. Sit back up. You can repeat this exercise up to 3 times a day. Follow these instructions at home:  After doing the Epley maneuver, you can return to your normal activities.  Ask your health care provider if there is anything you should do at home to prevent vertigo. He or she may recommend that you:  Keep your head raised (elevated) with two or more pillows while you sleep.  Do not sleep on the side of your affected ear.  Get up slowly from bed.  Avoid sudden movements during the day.  Avoid extreme head movement, like looking up or bending over. Contact a health care provider if:  Your vertigo gets worse.  You have other symptoms,  including:  Nausea.  Vomiting.  Headache. Get help right away if:  You have vision changes.  You have a severe or worsening headache or neck pain.  You cannot stop vomiting.  You have new numbness or weakness in any part of your body. Summary  Vertigo is the feeling that you or your surroundings are moving when they are not.  The Epley maneuver is an exercise that relieves symptoms of vertigo.  If the Epley maneuver is done correctly, it is considered safe. You can do it up to 3 times a day. This information is not intended to replace advice given to you by your health care provider. Make sure you discuss any questions you have with your health care provider. Document Released: 09/28/2013 Document Revised: 08/13/2016 Document Reviewed: 08/13/2016 Elsevier Interactive Patient Education  2017 [employer].    Chartered certified accountant Patient Education  [employer].

## 2017-02-27 NOTE — Assessment & Plan Note (Signed)
Well controlled, no changes to meds. Encouraged heart healthy diet such as the DASH diet and exercise as tolerated.  °

## 2017-03-10 ENCOUNTER — Other Ambulatory Visit: Payer: Self-pay | Admitting: Family Medicine

## 2017-03-10 DIAGNOSIS — L03119 Cellulitis of unspecified part of limb: Principal | ICD-10-CM

## 2017-03-10 DIAGNOSIS — L02419 Cutaneous abscess of limb, unspecified: Secondary | ICD-10-CM

## 2017-06-07 ENCOUNTER — Other Ambulatory Visit: Payer: Self-pay | Admitting: Family Medicine

## 2017-06-10 NOTE — Telephone Encounter (Signed)
Pt needs to sched appt first/thx dmf

## 2017-06-11 ENCOUNTER — Other Ambulatory Visit: Payer: Self-pay

## 2017-06-11 MED ORDER — ATORVASTATIN CALCIUM 20 MG PO TABS: 20.0000 mg | ORAL_TABLET | Freq: Every day | ORAL | 0 refills | Status: DC

## 2017-06-11 MED ORDER — POTASSIUM CHLORIDE ER 10 MEQ PO TBCR: 10.0000 meq | EXTENDED_RELEASE_TABLET | Freq: Every day | ORAL | 0 refills | Status: DC

## 2017-06-30 ENCOUNTER — Telehealth: Payer: Self-pay | Admitting: Family Medicine

## 2017-06-30 ENCOUNTER — Encounter: Payer: Self-pay | Admitting: Family Medicine

## 2017-06-30 ENCOUNTER — Ambulatory Visit (INDEPENDENT_AMBULATORY_CARE_PROVIDER_SITE_OTHER): Payer: 59 | Admitting: Family Medicine

## 2017-06-30 ENCOUNTER — Ambulatory Visit (HOSPITAL_BASED_OUTPATIENT_CLINIC_OR_DEPARTMENT_OTHER)
Admission: RE | Admit: 2017-06-30 | Discharge: 2017-06-30 | Disposition: A | Discharge status: Home / Self Care | Payer: 59 | Source: Ambulatory Visit | Attending: Family Medicine | Admitting: Family Medicine

## 2017-06-30 VITALS — BP 134/88 | HR 60 | Resp 18 | Wt 307.0 lb

## 2017-06-30 DIAGNOSIS — L03115 Cellulitis of right lower limb: Secondary | ICD-10-CM

## 2017-06-30 MED ORDER — CEFTRIAXONE SODIUM 1 G IJ SOLR
1.0000 g | Freq: Once | INTRAMUSCULAR | Status: AC
  Administered 2017-06-30: 1 g via INTRAMUSCULAR

## 2017-06-30 MED ORDER — LEVOFLOXACIN 500 MG PO TABS: 500.0000 mg | ORAL_TABLET | Freq: Every day | ORAL | 0 refills | Status: DC

## 2017-06-30 NOTE — Telephone Encounter (Signed)
Pt returned call for imaging results.   CB:[phone]  Pt says that it is okay to leave a detailed message if miss call back

## 2017-06-30 NOTE — Patient Instructions (Signed)

## 2017-06-30 NOTE — Progress Notes (Signed)
Patient ID: Tracey Page, female    DOB: 1956/08/09  Age: 61 y.o. MRN: [id number]    Subjective:  Subjective  HPI Tracey Page presents for cellulitis R low ext --- she never came back for f/u after may visit  Her mom has passed away since and there has been a lot of stress since.    Review of Systems  Constitutional: Negative for activity change, appetite change, fatigue and unexpected weight change.  Respiratory: Negative for cough and shortness of breath.   Cardiovascular: Negative for chest pain and palpitations.  Skin: Positive for wound.  Psychiatric/Behavioral: Negative for behavioral problems and dysphoric mood. The patient is not nervous/anxious.     History Past Medical History:  Diagnosis Date  . Hyperlipidemia   . Hypertension   . Thyroid disease     She has a past surgical history that includes Ectopic pregnancy surgery.   Her family history includes Aneurysm in her father; Arthritis in her mother; Coronary artery disease in her unknown relative; Diabetes in her maternal grandfather; Hyperlipidemia in her unknown relative; Hypertension in her unknown relative.She reports that she has quit smoking. She has never used smokeless tobacco. She reports that she drinks alcohol. She reports that she does not use drugs.  Current Outpatient Prescriptions on File Prior to Visit  Medication Sig Dispense Refill  . ALPRAZolam (XANAX) 0.25 MG tablet Take 1 tablet (0.25 mg total) by mouth 2 (two) times daily as needed for anxiety. 45 tablet 2  . amLODipine (NORVASC) 5 MG tablet Take 1 tablet (5 mg total) by mouth daily. 90 tablet 3  . atorvastatin (LIPITOR) 20 MG tablet Take 1 tablet (20 mg total) by mouth daily. 30 tablet 0  . levofloxacin (LEVAQUIN) 500 MG tablet Take 1 tablet (500 mg total) by mouth daily. 10 tablet 0  . metoprolol succinate (TOPROL-XL) 50 MG 24 hr tablet TAKE 3 TABLETS BY MOUTH DAILY IMMEDIATELY FOLLOWING A MEAL. 270 tablet 1  . potassium chloride (K-DUR) 10  MEQ tablet Take 1 tablet (10 mEq total) by mouth daily. 30 tablet 0  . RABEprazole (ACIPHEX) 20 MG tablet Take 1 tablet (20 mg total) by mouth daily. 90 tablet 1  . SYNTHROID 125 MCG tablet TAKE ONE TABLET BY MOUTH ONCE DAILY BEFORE BREAKFAST 30 tablet 11   No current facility-administered medications on file prior to visit.      Objective:  Objective  Physical Exam  Constitutional: She is oriented to person, place, and time. She appears well-developed and well-nourished.  HENT:  Head: Normocephalic and atraumatic.  Eyes: Conjunctivae and EOM are normal.  Neck: Normal range of motion. Neck supple. No JVD present. Carotid bruit is not present. No thyromegaly present.  Cardiovascular: Normal rate, regular rhythm and normal heart sounds.   No murmur heard. Pulmonary/Chest: Effort normal and breath sounds normal. No respiratory distress. She has no wheezes. She has no rales. She exhibits no tenderness.  Musculoskeletal: She exhibits no edema.  Neurological: She is alert and oriented to person, place, and time.  Skin: Lesion noted. There is erythema.     Psychiatric: She has a normal mood and affect.  Nursing note and vitals reviewed.  BP 134/88   Pulse 60   Resp 18   Wt (!) 307 lb (139.3 kg)   SpO2 99%   BMI 49.55 kg/m  Wt Readings from Last 3 Encounters:  06/30/17 (!) 307 lb (139.3 kg)  02/27/17 (!) 315 lb 6.4 oz (143.1 kg)  10/11/16 (!) 314 lb  3.2 oz (142.5 kg)     Lab Results  Component Value Date   WBC 6.7 09/09/2016   HGB 13.8 09/09/2016   HCT 41.2 09/09/2016   PLT 257.0 09/09/2016   GLUCOSE 82 09/09/2016   CHOL 180 09/09/2016   TRIG 102.0 09/09/2016   HDL 44.10 09/09/2016   LDLDIRECT 179.1 09/16/2012   LDLCALC 115 (H) 09/09/2016   ALT 27 09/09/2016   AST 19 09/09/2016   NA 141 09/09/2016   K 3.5 09/09/2016   CL 103 09/09/2016   CREATININE 0.73 09/09/2016   BUN 17 09/09/2016   CO2 28 09/09/2016   TSH 1.186 10/10/2015   MICROALBUR 8.2 (H) 07/14/2015     No results found.   Assessment & Plan:  Plan  I am having Tracey Page start on levofloxacin. I am also having her maintain her ALPRAZolam, SYNTHROID, amLODipine, metoprolol succinate, RABEprazole, levofloxacin, potassium chloride, and atorvastatin. We administered cefTRIAXone.  Meds ordered this encounter  Medications  . levofloxacin (LEVAQUIN) 500 MG tablet    Sig: Take 1 tablet (500 mg total) by mouth daily.    Dispense:  14 tablet    Refill:  0  . cefTRIAXone (ROCEPHIN) injection 1 g    Problem List Items Addressed This Visit    None    Visit Diagnoses    Cellulitis of right lower extremity    -  Primary   Relevant Medications   levofloxacin (LEVAQUIN) 500 MG tablet   cefTRIAXone (ROCEPHIN) injection 1 g (Completed)   Other Relevant Orders   DG Tibia/Fibula Right      Follow-up: Return in about 3 days (around 07/03/2017), or if symptoms worsen or fail to improve.  Ann Held, DO

## 2017-07-02 NOTE — Telephone Encounter (Signed)
See results

## 2017-07-03 ENCOUNTER — Encounter: Payer: Self-pay | Admitting: Family Medicine

## 2017-07-03 ENCOUNTER — Ambulatory Visit (INDEPENDENT_AMBULATORY_CARE_PROVIDER_SITE_OTHER): Payer: 59 | Admitting: Family Medicine

## 2017-07-03 VITALS — BP 136/90 | HR 76 | Ht 66.0 in | Wt 309.0 lb

## 2017-07-03 DIAGNOSIS — S81801D Unspecified open wound, right lower leg, subsequent encounter: Secondary | ICD-10-CM | POA: Diagnosis not present

## 2017-07-03 MED ORDER — CEFTRIAXONE SODIUM 1 G IJ SOLR
1.0000 g | Freq: Once | INTRAMUSCULAR | Status: AC
  Administered 2017-07-03: 1 g via INTRAMUSCULAR

## 2017-07-03 NOTE — Addendum Note (Signed)
Addended by: Roma Kayser on: 07/03/2017 05:05 PM   Modules accepted: Orders

## 2017-07-03 NOTE — Progress Notes (Signed)
Patient ID: Tracey Page, female    DOB: Dec 13, 1955  Age: 61 y.o. MRN: [id number]    Subjective:  Subjective  HPI Tracey Page presents for f/u r low leg cellulitis.  It is improving but still red and some draining.    Review of Systems  Constitutional: Negative for appetite change, diaphoresis, fatigue and unexpected weight change.  Eyes: Negative for pain, redness and visual disturbance.  Respiratory: Negative for cough, chest tightness, shortness of breath and wheezing.   Cardiovascular: Negative for chest pain, palpitations and leg swelling.  Endocrine: Negative for cold intolerance, heat intolerance, polydipsia, polyphagia and polyuria.  Genitourinary: Negative for difficulty urinating, dysuria and frequency.  Neurological: Negative for dizziness, light-headedness, numbness and headaches.    History Past Medical History:  Diagnosis Date  . Hyperlipidemia   . Hypertension   . Thyroid disease     She has a past surgical history that includes Ectopic pregnancy surgery.   Her family history includes Aneurysm in her father; Arthritis in her mother; Coronary artery disease in her unknown relative; Diabetes in her maternal grandfather; Hyperlipidemia in her unknown relative; Hypertension in her unknown relative.She reports that she has quit smoking. She has never used smokeless tobacco. She reports that she drinks alcohol. She reports that she does not use drugs.  Current Outpatient Prescriptions on File Prior to Visit  Medication Sig Dispense Refill  . ALPRAZolam (XANAX) 0.25 MG tablet Take 1 tablet (0.25 mg total) by mouth 2 (two) times daily as needed for anxiety. 45 tablet 2  . amLODipine (NORVASC) 5 MG tablet Take 1 tablet (5 mg total) by mouth daily. 90 tablet 3  . atorvastatin (LIPITOR) 20 MG tablet Take 1 tablet (20 mg total) by mouth daily. 30 tablet 0  . levofloxacin (LEVAQUIN) 500 MG tablet Take 1 tablet (500 mg total) by mouth daily. 10 tablet 0  . levofloxacin  (LEVAQUIN) 500 MG tablet Take 1 tablet (500 mg total) by mouth daily. 14 tablet 0  . metoprolol succinate (TOPROL-XL) 50 MG 24 hr tablet TAKE 3 TABLETS BY MOUTH DAILY IMMEDIATELY FOLLOWING A MEAL. 270 tablet 1  . potassium chloride (K-DUR) 10 MEQ tablet Take 1 tablet (10 mEq total) by mouth daily. 30 tablet 0  . RABEprazole (ACIPHEX) 20 MG tablet Take 1 tablet (20 mg total) by mouth daily. 90 tablet 1  . SYNTHROID 125 MCG tablet TAKE ONE TABLET BY MOUTH ONCE DAILY BEFORE BREAKFAST 30 tablet 11   No current facility-administered medications on file prior to visit.      Objective:  Objective  Physical Exam  Skin:     Nursing note and vitals reviewed.  BP 136/90   Pulse 76   Ht 5\' 6"  (1.676 m)   Wt (!) 309 lb (140.2 kg)   BMI 49.87 kg/m  Wt Readings from Last 3 Encounters:  07/03/17 (!) 309 lb (140.2 kg)  06/30/17 (!) 307 lb (139.3 kg)  02/27/17 (!) 315 lb 6.4 oz (143.1 kg)     Lab Results  Component Value Date   WBC 6.7 09/09/2016   HGB 13.8 09/09/2016   HCT 41.2 09/09/2016   PLT 257.0 09/09/2016   GLUCOSE 82 09/09/2016   CHOL 180 09/09/2016   TRIG 102.0 09/09/2016   HDL 44.10 09/09/2016   LDLDIRECT 179.1 09/16/2012   LDLCALC 115 (H) 09/09/2016   ALT 27 09/09/2016   AST 19 09/09/2016   NA 141 09/09/2016   K 3.5 09/09/2016   CL 103 09/09/2016   CREATININE  0.73 09/09/2016   BUN 17 09/09/2016   CO2 28 09/09/2016   TSH 1.186 10/10/2015   MICROALBUR 8.2 (H) 07/14/2015    Dg Tibia/fibula Right  Result Date: 06/30/2017 CLINICAL DATA:  Right lower leg cellulitis. EXAM: RIGHT TIBIA AND FIBULA - 2 VIEW COMPARISON:  Left knee and ankle x-rays from 04/13/2015. FINDINGS: No fracture. No worrisome lytic or sclerotic osseous abnormality. No erosive or destructive lesion. IMPRESSION: Negative. Electronically Signed   By: Misty Stanley M.D.   On: 06/30/2017 16:48     Assessment & Plan:  Plan  I am having Ms. Brickel maintain her ALPRAZolam, SYNTHROID, amLODipine, metoprolol  succinate, RABEprazole, levofloxacin, potassium chloride, atorvastatin, and levofloxacin.  No orders of the defined types were placed in this encounter.   Problem List Items Addressed This Visit    None    Visit Diagnoses    Wound of right lower extremity, subsequent encounter    -  Primary   Relevant Orders   WOUND CULTURE        Follow-up: Return in about 1 week (around 07/10/2017) for f/u leg.  Ann Held, DO

## 2017-07-03 NOTE — Patient Instructions (Signed)

## 2017-07-06 LAB — WOUND CULTURE
MICRO NUMBER:: 81073240
SPECIMEN QUALITY:: ADEQUATE

## 2017-07-10 ENCOUNTER — Encounter: Payer: Self-pay | Admitting: Family Medicine

## 2017-07-10 ENCOUNTER — Ambulatory Visit (INDEPENDENT_AMBULATORY_CARE_PROVIDER_SITE_OTHER): Payer: 59 | Admitting: Family Medicine

## 2017-07-10 DIAGNOSIS — L03115 Cellulitis of right lower limb: Secondary | ICD-10-CM

## 2017-07-10 MED ORDER — CEFTRIAXONE SODIUM 1 G IJ SOLR
1.0000 g | Freq: Once | INTRAMUSCULAR | Status: AC
  Administered 2017-07-10: 1 g via INTRAMUSCULAR

## 2017-07-10 MED ORDER — LEVOFLOXACIN 500 MG PO TABS: 500.0000 mg | ORAL_TABLET | Freq: Every day | ORAL | 0 refills | Status: DC

## 2017-07-10 NOTE — Patient Instructions (Signed)

## 2017-07-10 NOTE — Assessment & Plan Note (Signed)
Refill abx Rocephin 1 gm rto 1 week or sooner prn It is healing but slowly -- if not significantly better in 1 week will refer to wound clinic

## 2017-07-10 NOTE — Progress Notes (Signed)
Patient ID: Tracey Page, female    DOB: 1955-12-18  Age: 61 y.o. MRN: [id number]    Subjective:  Subjective  HPI Tracey Page presents for f/u wound/ cellulitis R low ext.  Pt states it feels much better and she feels it is improving.     Review of Systems  Constitutional: Negative for chills and fever.  HENT: Negative for congestion and hearing loss.   Eyes: Negative for discharge.  Respiratory: Negative for cough and shortness of breath.   Cardiovascular: Negative for chest pain, palpitations and leg swelling.  Gastrointestinal: Negative for abdominal pain, blood in stool, constipation, diarrhea, nausea and vomiting.  Genitourinary: Negative for dysuria, frequency, hematuria and urgency.  Musculoskeletal: Negative for back pain and myalgias.  Skin: Positive for wound. Negative for rash.  Allergic/Immunologic: Negative for environmental allergies.  Neurological: Negative for dizziness, weakness and headaches.  Hematological: Does not bruise/bleed easily.  Psychiatric/Behavioral: Negative for suicidal ideas. The patient is not nervous/anxious.     History Past Medical History:  Diagnosis Date  . Hyperlipidemia   . Hypertension   . Thyroid disease     She has a past surgical history that includes Ectopic pregnancy surgery.   Her family history includes Aneurysm in her father; Arthritis in her mother; Coronary artery disease in her unknown relative; Diabetes in her maternal grandfather; Hyperlipidemia in her unknown relative; Hypertension in her unknown relative.She reports that she has quit smoking. She has never used smokeless tobacco. She reports that she drinks alcohol. She reports that she does not use drugs.  Current Outpatient Prescriptions on File Prior to Visit  Medication Sig Dispense Refill  . ALPRAZolam (XANAX) 0.25 MG tablet Take 1 tablet (0.25 mg total) by mouth 2 (two) times daily as needed for anxiety. 45 tablet 2  . amLODipine (NORVASC) 5 MG tablet Take 1  tablet (5 mg total) by mouth daily. 90 tablet 3  . atorvastatin (LIPITOR) 20 MG tablet Take 1 tablet (20 mg total) by mouth daily. 30 tablet 0  . metoprolol succinate (TOPROL-XL) 50 MG 24 hr tablet TAKE 3 TABLETS BY MOUTH DAILY IMMEDIATELY FOLLOWING A MEAL. 270 tablet 1  . potassium chloride (K-DUR) 10 MEQ tablet Take 1 tablet (10 mEq total) by mouth daily. 30 tablet 0  . RABEprazole (ACIPHEX) 20 MG tablet Take 1 tablet (20 mg total) by mouth daily. 90 tablet 1  . SYNTHROID 125 MCG tablet TAKE ONE TABLET BY MOUTH ONCE DAILY BEFORE BREAKFAST 30 tablet 11   No current facility-administered medications on file prior to visit.      Objective:  Objective  Physical Exam  Constitutional: She is oriented to person, place, and time. She appears well-developed and well-nourished.  HENT:  Head: Normocephalic and atraumatic.  Eyes: Conjunctivae and EOM are normal.  Neck: Normal range of motion. Neck supple. No JVD present. Carotid bruit is not present. No thyromegaly present.  Cardiovascular: Normal rate, regular rhythm and normal heart sounds.   No murmur heard. Pulmonary/Chest: Effort normal and breath sounds normal. No respiratory distress. She has no wheezes. She has no rales. She exhibits no tenderness.  Musculoskeletal: She exhibits no edema.  Neurological: She is alert and oriented to person, place, and time.  Skin: There is erythema.     Psychiatric: She has a normal mood and affect.  Nursing note and vitals reviewed.    BP 116/74 (BP Location: Right Arm, Patient Position: Sitting, Cuff Size: Large)   Pulse (!) 50   Temp 98.1 F (36.7  C) (Oral)   Ht 5\' 6"  (1.676 m)   Wt (!) 306 lb 6.4 oz (139 kg)   SpO2 99%   BMI 49.45 kg/m  Wt Readings from Last 3 Encounters:  07/10/17 (!) 306 lb 6.4 oz (139 kg)  07/03/17 (!) 309 lb (140.2 kg)  06/30/17 (!) 307 lb (139.3 kg)     Lab Results  Component Value Date   WBC 6.7 09/09/2016   HGB 13.8 09/09/2016   HCT 41.2 09/09/2016   PLT  257.0 09/09/2016   GLUCOSE 82 09/09/2016   CHOL 180 09/09/2016   TRIG 102.0 09/09/2016   HDL 44.10 09/09/2016   LDLDIRECT 179.1 09/16/2012   LDLCALC 115 (H) 09/09/2016   ALT 27 09/09/2016   AST 19 09/09/2016   NA 141 09/09/2016   K 3.5 09/09/2016   CL 103 09/09/2016   CREATININE 0.73 09/09/2016   BUN 17 09/09/2016   CO2 28 09/09/2016   TSH 1.186 10/10/2015   MICROALBUR 8.2 (H) 07/14/2015    Dg Tibia/fibula Right  Result Date: 06/30/2017 CLINICAL DATA:  Right lower leg cellulitis. EXAM: RIGHT TIBIA AND FIBULA - 2 VIEW COMPARISON:  Left knee and ankle x-rays from 04/13/2015. FINDINGS: No fracture. No worrisome lytic or sclerotic osseous abnormality. No erosive or destructive lesion. IMPRESSION: Negative. Electronically Signed   By: Misty Stanley M.D.   On: 06/30/2017 16:48     Assessment & Plan:  Plan  I am having Tracey Page maintain her ALPRAZolam, SYNTHROID, amLODipine, metoprolol succinate, RABEprazole, potassium chloride, atorvastatin, and levofloxacin.  Meds ordered this encounter  Medications  . levofloxacin (LEVAQUIN) 500 MG tablet    Sig: Take 1 tablet (500 mg total) by mouth daily.    Dispense:  14 tablet    Refill:  0    Problem List Items Addressed This Visit      Unprioritized   Cellulitis of right lower extremity    Refill abx Rocephin 1 gm rto 1 week or sooner prn It is healing but slowly -- if not significantly better in 1 week will refer to wound clinic      Relevant Medications   levofloxacin (LEVAQUIN) 500 MG tablet      Follow-up: Return in about 1 week (around 07/17/2017), or if symptoms worsen or fail to improve.  Ann Held, DO

## 2017-07-17 ENCOUNTER — Other Ambulatory Visit: Payer: Self-pay | Admitting: Family Medicine

## 2017-07-17 ENCOUNTER — Ambulatory Visit: Payer: Self-pay | Admitting: Family Medicine

## 2017-07-17 DIAGNOSIS — I10 Essential (primary) hypertension: Secondary | ICD-10-CM

## 2017-07-18 ENCOUNTER — Ambulatory Visit (INDEPENDENT_AMBULATORY_CARE_PROVIDER_SITE_OTHER): Payer: 59 | Admitting: Family Medicine

## 2017-07-18 ENCOUNTER — Encounter: Payer: Self-pay | Admitting: Family Medicine

## 2017-07-18 VITALS — BP 156/78 | HR 64 | Temp 97.8°F | Resp 16 | Ht 66.0 in | Wt 307.6 lb

## 2017-07-18 DIAGNOSIS — L03115 Cellulitis of right lower limb: Secondary | ICD-10-CM | POA: Diagnosis not present

## 2017-07-18 DIAGNOSIS — E785 Hyperlipidemia, unspecified: Secondary | ICD-10-CM

## 2017-07-18 NOTE — Patient Instructions (Signed)

## 2017-07-18 NOTE — Assessment & Plan Note (Signed)
Finish abx Pt to con't to take care of wound as wound clinic instructed her the last time this occurred  rto prn

## 2017-07-18 NOTE — Progress Notes (Signed)
Patient ID: Tracey Page, female    DOB: 07/10/1956  Age: 61 y.o. MRN: [id number]    Subjective:  Subjective  HPI Tracey Page presents for f/u cellulitis R low leg.   Review of Systems  Constitutional: Negative for appetite change, diaphoresis, fatigue and unexpected weight change.  Eyes: Negative for pain, redness and visual disturbance.  Respiratory: Negative for cough, chest tightness, shortness of breath and wheezing.   Cardiovascular: Negative for chest pain, palpitations and leg swelling.  Endocrine: Negative for cold intolerance, heat intolerance, polydipsia, polyphagia and polyuria.  Genitourinary: Negative for difficulty urinating, dysuria and frequency.  Skin: Positive for color change and wound.  Neurological: Negative for dizziness, light-headedness, numbness and headaches.    History Past Medical History:  Diagnosis Date  . Hyperlipidemia   . Hypertension   . Thyroid disease     She has a past surgical history that includes Ectopic pregnancy surgery.   Her family history includes Aneurysm in her father; Arthritis in her mother; Coronary artery disease in her unknown relative; Diabetes in her maternal grandfather; Hyperlipidemia in her unknown relative; Hypertension in her unknown relative.She reports that she has quit smoking. She has never used smokeless tobacco. She reports that she drinks alcohol. She reports that she does not use drugs.  Current Outpatient Prescriptions on File Prior to Visit  Medication Sig Dispense Refill  . ALPRAZolam (XANAX) 0.25 MG tablet Take 1 tablet (0.25 mg total) by mouth 2 (two) times daily as needed for anxiety. 45 tablet 2  . amLODipine (NORVASC) 5 MG tablet Take 1 tablet (5 mg total) by mouth daily. 90 tablet 3  . atorvastatin (LIPITOR) 20 MG tablet Take 1 tablet (20 mg total) by mouth daily. 30 tablet 0  . levofloxacin (LEVAQUIN) 500 MG tablet Take 1 tablet (500 mg total) by mouth daily. 14 tablet 0  . metoprolol succinate  (TOPROL-XL) 50 MG 24 hr tablet TAKE 3 TABLETS BY MOUTH ONCE DAILY IMMEDIATELY  FOLLOWING  A  MEAL 270 tablet 1  . potassium chloride (K-DUR) 10 MEQ tablet Take 1 tablet (10 mEq total) by mouth daily. 30 tablet 0  . RABEprazole (ACIPHEX) 20 MG tablet Take 1 tablet (20 mg total) by mouth daily. 90 tablet 1  . SYNTHROID 125 MCG tablet TAKE ONE TABLET BY MOUTH ONCE DAILY BEFORE BREAKFAST 30 tablet 11   No current facility-administered medications on file prior to visit.      Objective:  Objective  Physical Exam  Constitutional: She is oriented to person, place, and time. She appears well-developed and well-nourished.  HENT:  Head: Normocephalic and atraumatic.  Eyes: Conjunctivae and EOM are normal.  Neck: Normal range of motion. Neck supple. No JVD present. Carotid bruit is not present. No thyromegaly present.  Cardiovascular: Normal rate, regular rhythm and normal heart sounds.   No murmur heard. Pulmonary/Chest: Effort normal and breath sounds normal. No respiratory distress. She has no wheezes. She has no rales. She exhibits no tenderness.  Musculoskeletal: She exhibits tenderness. She exhibits no edema.  Neurological: She is alert and oriented to person, place, and time.  Skin: There is erythema.     Psychiatric: She has a normal mood and affect.   BP (!) 156/78   Pulse 64   Temp 97.8 F (36.6 C) (Oral)   Resp 16   Ht 5\' 6"  (1.676 m)   Wt (!) 307 lb 9.6 oz (139.5 kg)   SpO2 99%   BMI 49.65 kg/m  Wt Readings from Last  3 Encounters:  07/18/17 (!) 307 lb 9.6 oz (139.5 kg)  07/10/17 (!) 306 lb 6.4 oz (139 kg)  07/03/17 (!) 309 lb (140.2 kg)     Lab Results  Component Value Date   WBC 6.7 09/09/2016   HGB 13.8 09/09/2016   HCT 41.2 09/09/2016   PLT 257.0 09/09/2016   GLUCOSE 82 09/09/2016   CHOL 180 09/09/2016   TRIG 102.0 09/09/2016   HDL 44.10 09/09/2016   LDLDIRECT 179.1 09/16/2012   LDLCALC 115 (H) 09/09/2016   ALT 27 09/09/2016   AST 19 09/09/2016   NA 141  09/09/2016   K 3.5 09/09/2016   CL 103 09/09/2016   CREATININE 0.73 09/09/2016   BUN 17 09/09/2016   CO2 28 09/09/2016   TSH 1.186 10/10/2015   MICROALBUR 8.2 (H) 07/14/2015    Dg Tibia/fibula Right  Result Date: 06/30/2017 CLINICAL DATA:  Right lower leg cellulitis. EXAM: RIGHT TIBIA AND FIBULA - 2 VIEW COMPARISON:  Left knee and ankle x-rays from 04/13/2015. FINDINGS: No fracture. No worrisome lytic or sclerotic osseous abnormality. No erosive or destructive lesion. IMPRESSION: Negative. Electronically Signed   By: Misty Stanley M.D.   On: 06/30/2017 16:48     Assessment & Plan:  Plan  I am having [name] maintain her ALPRAZolam, SYNTHROID, amLODipine, RABEprazole, potassium chloride, atorvastatin, levofloxacin, and metoprolol succinate.  No orders of the defined types were placed in this encounter.   Problem List Items Addressed This Visit      Unprioritized   Cellulitis of right lower leg    Finish abx Pt to con't to take care of wound as wound clinic instructed her the last time this occurred  rto prn       Other Visit Diagnoses    Hyperlipidemia LDL goal <100    -  Primary   Relevant Orders   Lipid panel   Comprehensive metabolic panel   Hemoglobin A1c      Follow-up: Return in about 3 months (around 10/18/2017).  Ann Held, DO

## 2017-07-25 ENCOUNTER — Ambulatory Visit (INDEPENDENT_AMBULATORY_CARE_PROVIDER_SITE_OTHER): Payer: 59 | Admitting: Family Medicine

## 2017-07-25 ENCOUNTER — Encounter: Payer: Self-pay | Admitting: Family Medicine

## 2017-07-25 VITALS — BP 160/92 | HR 77 | Temp 98.7°F | Ht 66.0 in | Wt 305.0 lb

## 2017-07-25 DIAGNOSIS — E785 Hyperlipidemia, unspecified: Secondary | ICD-10-CM | POA: Diagnosis not present

## 2017-07-25 DIAGNOSIS — S81801D Unspecified open wound, right lower leg, subsequent encounter: Secondary | ICD-10-CM

## 2017-07-25 DIAGNOSIS — R600 Localized edema: Secondary | ICD-10-CM | POA: Diagnosis not present

## 2017-07-25 DIAGNOSIS — S81801A Unspecified open wound, right lower leg, initial encounter: Secondary | ICD-10-CM | POA: Insufficient documentation

## 2017-07-25 DIAGNOSIS — I10 Essential (primary) hypertension: Secondary | ICD-10-CM | POA: Diagnosis not present

## 2017-07-25 LAB — COMPREHENSIVE METABOLIC PANEL
ALBUMIN: 4 g/dL (ref 3.5–5.2)
ALT: 16 U/L (ref 0–35)
AST: 16 U/L (ref 0–37)
Alkaline Phosphatase: 89 U/L (ref 39–117)
BILIRUBIN TOTAL: 0.6 mg/dL (ref 0.2–1.2)
BUN: 12 mg/dL (ref 6–23)
CALCIUM: 9.1 mg/dL (ref 8.4–10.5)
CHLORIDE: 101 meq/L (ref 96–112)
CO2: 34 meq/L — AB (ref 19–32)
CREATININE: 0.75 mg/dL (ref 0.40–1.20)
GFR: 100.86 mL/min (ref >60.00)
Glucose, Bld: 102 mg/dL — ABNORMAL HIGH (ref 70–99)
Potassium: 3.7 mEq/L (ref 3.5–5.1)
SODIUM: 140 meq/L (ref 135–145)
Total Protein: 7.6 g/dL (ref 6.0–8.3)

## 2017-07-25 LAB — LIPID PANEL
CHOL/HDL RATIO: 4
CHOLESTEROL: 140 mg/dL (ref 0–200)
HDL: 38.5 mg/dL — ABNORMAL LOW (ref >39.00)
LDL CALC: 88 mg/dL (ref 0–99)
NonHDL: 101.96
TRIGLYCERIDES: 71 mg/dL (ref 0.0–149.0)
VLDL: 14.2 mg/dL (ref 0.0–40.0)

## 2017-07-25 LAB — HEMOGLOBIN A1C: Hgb A1c MFr Bld: 6.4 % (ref 4.6–6.5)

## 2017-07-25 MED ORDER — METOPROLOL SUCCINATE ER 50 MG PO TB24: ORAL_TABLET | ORAL | 1 refills | Status: DC

## 2017-07-25 NOTE — Progress Notes (Signed)
Patient ID: MAEBY VANKLEECK, female    DOB: 1956/06/18  Age: 61 y.o. MRN: [id number]    Subjective:  Subjective  HPI Tracey Page presents for f/u R low leg cellulitis.  She said it is scabbed over   Review of Systems  Constitutional: Negative for activity change, appetite change, fatigue and unexpected weight change.  Respiratory: Negative for cough and shortness of breath.   Cardiovascular: Negative for chest pain and palpitations.  Skin: Positive for wound.  Psychiatric/Behavioral: Negative for behavioral problems and dysphoric mood. The patient is not nervous/anxious.     History Past Medical History:  Diagnosis Date  . Hyperlipidemia   . Hypertension   . Thyroid disease     She has a past surgical history that includes Ectopic pregnancy surgery.   Her family history includes Aneurysm in her father; Arthritis in her mother; Coronary artery disease in her unknown relative; Diabetes in her maternal grandfather; Hyperlipidemia in her unknown relative; Hypertension in her unknown relative.She reports that she has quit smoking. She has never used smokeless tobacco. She reports that she drinks alcohol. She reports that she does not use drugs.  Current Outpatient Prescriptions on File Prior to Visit  Medication Sig Dispense Refill  . ALPRAZolam (XANAX) 0.25 MG tablet Take 1 tablet (0.25 mg total) by mouth 2 (two) times daily as needed for anxiety. 45 tablet 2  . amLODipine (NORVASC) 5 MG tablet Take 1 tablet (5 mg total) by mouth daily. 90 tablet 3  . atorvastatin (LIPITOR) 20 MG tablet Take 1 tablet (20 mg total) by mouth daily. 30 tablet 0  . levofloxacin (LEVAQUIN) 500 MG tablet Take 1 tablet (500 mg total) by mouth daily. 14 tablet 0  . potassium chloride (K-DUR) 10 MEQ tablet Take 1 tablet (10 mEq total) by mouth daily. 30 tablet 0  . RABEprazole (ACIPHEX) 20 MG tablet Take 1 tablet (20 mg total) by mouth daily. 90 tablet 1  . SYNTHROID 125 MCG tablet TAKE ONE TABLET BY MOUTH  ONCE DAILY BEFORE BREAKFAST 30 tablet 11   No current facility-administered medications on file prior to visit.      Objective:  Objective  Physical Exam  Skin:     Nursing note and vitals reviewed.  BP (!) 160/92   Pulse 77   Temp 98.7 F (37.1 C) (Oral)   Ht 5\' 6"  (1.676 m)   Wt (!) 305 lb (138.3 kg)   SpO2 99%   BMI 49.23 kg/m  Wt Readings from Last 3 Encounters:  07/25/17 (!) 305 lb (138.3 kg)  07/18/17 (!) 307 lb 9.6 oz (139.5 kg)  07/10/17 (!) 306 lb 6.4 oz (139 kg)     Lab Results  Component Value Date   WBC 6.7 09/09/2016   HGB 13.8 09/09/2016   HCT 41.2 09/09/2016   PLT 257.0 09/09/2016   GLUCOSE 102 (H) 07/25/2017   CHOL 140 07/25/2017   TRIG 71.0 07/25/2017   HDL 38.50 (L) 07/25/2017   LDLDIRECT 179.1 09/16/2012   LDLCALC 88 07/25/2017   ALT 16 07/25/2017   AST 16 07/25/2017   NA 140 07/25/2017   K 3.7 07/25/2017   CL 101 07/25/2017   CREATININE 0.75 07/25/2017   BUN 12 07/25/2017   CO2 34 (H) 07/25/2017   TSH 1.186 10/10/2015   HGBA1C 6.4 07/25/2017   MICROALBUR 8.2 (H) 07/14/2015    Dg Tibia/fibula Right  Result Date: 06/30/2017 CLINICAL DATA:  Right lower leg cellulitis. EXAM: RIGHT TIBIA AND FIBULA - 2  VIEW COMPARISON:  Left knee and ankle x-rays from 04/13/2015. FINDINGS: No fracture. No worrisome lytic or sclerotic osseous abnormality. No erosive or destructive lesion. IMPRESSION: Negative. Electronically Signed   By: Misty Stanley M.D.   On: 06/30/2017 16:48     Assessment & Plan:  Plan  I am having Tracey Page maintain her ALPRAZolam, SYNTHROID, amLODipine, RABEprazole, potassium chloride, atorvastatin, levofloxacin, and metoprolol succinate.  Meds ordered this encounter  Medications  . metoprolol succinate (TOPROL-XL) 50 MG 24 hr tablet    Sig: TAKE 3 TABLETS BY MOUTH ONCE DAILY IMMEDIATELY  FOLLOWING  A  MEAL    Dispense:  270 tablet    Refill:  1    Problem List Items Addressed This Visit      Unprioritized   EDEMA    Pt  got wrap around compression socks  Elevate legs      Essential hypertension    Well controlled, no changes to meds. Encouraged heart healthy diet such as the DASH diet and exercise as tolerated.       Relevant Medications   metoprolol succinate (TOPROL-XL) 50 MG 24 hr tablet   Hyperlipidemia LDL goal <100    Encouraged heart healthy diet, increase exercise, avoid trans fats, consider a krill oil cap daily      Relevant Medications   metoprolol succinate (TOPROL-XL) 50 MG 24 hr tablet   Severe obesity (BMI >= 40) (HCC)    D/w pt diet and exercise Pt given info for health weight and wellness       Wound of right leg - Primary    Cellulitis improved con't wound care          Follow-up: Return in about 1 week (around 08/01/2017), or if symptoms worsen or fail to improve.  Ann Held, DO

## 2017-07-25 NOTE — Patient Instructions (Signed)
Autolytic Wound Debridement What is autolytic wound debridement? Autolytic wound debridement is a treatment to remove dead tissue from a wound. This helps the wound heal. A bandage (dressing) is used to help your body's white blood cells remove damaged tissues from your wound without harming healthy tissue. This allows your body to repair the wound by growing more healthy tissue. How will my wound be treated? The method and frequency your wound is treated depends on certain characteristics of your wound, such as the size and location. The method and frequency your wound is treated also depends on the type of dressing that is used. Generally, this is what may happen:  Your health care provider will clean (irrigate) your wound with a solution. This is usually done with a germ-free, saltwater (saline) solution. The solution will flush the wound to remove any debris, bacteria, or dead tissue.  Your health care provider will then select and apply the right dressing for your wound. Some dressings may have ingredients that kill bacteria.  You may need to repeat this process. What are the risks and complications of autolytic wound debridement? Generally, this is a safe procedure. However, problems may occur, including an allergic reaction to an ingredient in the dressing. Tell your health care provider about any allergies you have. What steps do I need to take to care for my wound? Medicines  Take over-the-counter and prescription medicines only as told by your health care provider.  If you were prescribed an antibiotic medicine, take it or apply it as told by your health care provider. Do not stop taking or using the antibiotic even if your condition improves.  Wound Care  Follow instructions from your health care provider about: ? How to take care of your wound. ? When and how you should change your dressing. ? When you should remove your dressing. If your dressing is dry and stuck when you try to  remove it, moisten or wet the dressing with saline or water so that it can be removed without harming your skin or wound tissue.  Check your wound every day for signs of infection. Watch for: ? Redness or swelling. ? More fluid, blood, or pus. ? A bad smell. ? Increased pain.  General Instructions  Eat a healthy diet with lots of protein. Ask your health care provider to suggest the best diet for you.  Do not smoke. Smoking makes it harder for your body to heal.  Keep all follow-up visits as told by your health care provider. This is important.  Ask your health care provider what activities are safe for you.  Do not take baths, swim, or use a hot tub until your health care provider says you can.  When should I seek medical care? Seek medical care if:  You develop a new medical condition, such as diabetes, peripheral vascular disease, or conditions that affect your defense (immune) system.  You have a fever.  Your pain medicine is not helping.  Your wound is red and swollen.  You have increased bleeding.  You have pus coming from your wound.  You have a bad smell coming from your wound.  Your wound is not getting better after 1-2 weeks of treatment.  Some changes in the color of your wound are normal as it heals. Talk to your health care provider if you have any concerns. This information is not intended to replace advice given to you by your health care provider. Make sure you discuss any questions you have  with your health care provider. Document Released: 06/14/2015 Document Revised: 08/16/2016 Document Reviewed: 02/01/2015 Elsevier Interactive Patient Education  2017 [employer].

## 2017-07-25 NOTE — Assessment & Plan Note (Signed)
Pt got wrap around compression socks  Elevate legs

## 2017-07-25 NOTE — Assessment & Plan Note (Signed)
Cellulitis improved con't wound care

## 2017-07-27 NOTE — Assessment & Plan Note (Addendum)
D/w pt diet and exercise Pt given info for health weight and wellness

## 2017-07-27 NOTE — Assessment & Plan Note (Signed)
Encouraged heart healthy diet, increase exercise, avoid trans fats, consider a krill oil cap daily 

## 2017-07-27 NOTE — Assessment & Plan Note (Signed)
Well controlled, no changes to meds. Encouraged heart healthy diet such as the DASH diet and exercise as tolerated.  °

## 2017-07-28 ENCOUNTER — Other Ambulatory Visit: Payer: Self-pay | Admitting: Family Medicine

## 2017-07-28 DIAGNOSIS — E785 Hyperlipidemia, unspecified: Secondary | ICD-10-CM

## 2017-08-01 ENCOUNTER — Ambulatory Visit: Payer: Self-pay | Admitting: Family Medicine

## 2017-08-19 ENCOUNTER — Telehealth: Payer: Self-pay | Admitting: Family Medicine

## 2017-08-19 NOTE — Telephone Encounter (Signed)
Pt went to [facility] urgent care on [address] yesterday when she fell at home in her yard. Anterior distal non displaced avulsion TIBIA FX Right. Has crutches and boot. Pt wants to know who [facility] recommends for othopedic follow up. 180/190 BP at urgent care yesterday. Pt feels fine now but has not taken BP today. She just wants Lowne to have it documented. Pt states does not have a history of high bp.

## 2017-08-19 NOTE — Telephone Encounter (Signed)
Relation to OJ:JKKX Call back [phone]   Reason for call:  Patient called to clarify BP reading patient states BP reading is 180/90, please advise.  Referral Coordinator advised patient can call orthopedic directly to self schedule. If office unable to proceed with appointment patient will call back to obtain referral.

## 2017-08-21 NOTE — Telephone Encounter (Signed)
Patient notified and she will follow up 11/27

## 2017-08-21 NOTE — Telephone Encounter (Signed)
Take 2 norvasc daily and f/u here next week

## 2017-09-02 ENCOUNTER — Ambulatory Visit: Payer: Self-pay | Admitting: Family Medicine

## 2017-09-05 ENCOUNTER — Encounter: Payer: Self-pay | Admitting: Family Medicine

## 2017-09-05 ENCOUNTER — Ambulatory Visit: Payer: 59 | Admitting: Family Medicine

## 2017-09-05 DIAGNOSIS — I10 Essential (primary) hypertension: Secondary | ICD-10-CM

## 2017-09-05 MED ORDER — METOPROLOL SUCCINATE ER 50 MG PO TB24: ORAL_TABLET | ORAL | 1 refills | Status: DC

## 2017-09-05 MED ORDER — AMLODIPINE BESYLATE 10 MG PO TABS: 10.0000 mg | ORAL_TABLET | Freq: Every day | ORAL | 1 refills | Status: DC

## 2017-09-05 NOTE — Patient Instructions (Signed)

## 2017-09-05 NOTE — Assessment & Plan Note (Signed)
Poorly controlled will alter medications, encouraged DASH diet, minimize caffeine and obtain adequate sleep. Report concerning symptoms and follow up as directed and as needed 

## 2017-09-05 NOTE — Progress Notes (Signed)
Patient ID: Tracey Page, female    DOB: 1956/05/05  Age: 61 y.o. MRN: [id number]    Subjective:  Subjective  HPI Tracey Page presents for f/u bp.  She fracture her low leg and uc told her to get her bp checked because it was high.   Pt with no complaints.    Review of Systems  Constitutional: Negative for appetite change, diaphoresis, fatigue and unexpected weight change.  Eyes: Negative for pain, redness and visual disturbance.  Respiratory: Negative for cough, chest tightness, shortness of breath and wheezing.   Cardiovascular: Negative for chest pain, palpitations and leg swelling.  Endocrine: Negative for cold intolerance, heat intolerance, polydipsia, polyphagia and polyuria.  Genitourinary: Negative for difficulty urinating, dysuria and frequency.  Neurological: Negative for dizziness, light-headedness, numbness and headaches.    History Past Medical History:  Diagnosis Date  . Hyperlipidemia   . Hypertension   . Thyroid disease     She has a past surgical history that includes Ectopic pregnancy surgery.   Her family history includes Aneurysm in her father; Arthritis in her mother; Coronary artery disease in her unknown relative; Diabetes in her maternal grandfather; Hyperlipidemia in her unknown relative; Hypertension in her unknown relative.She reports that she has quit smoking. she has never used smokeless tobacco. She reports that she drinks alcohol. She reports that she does not use drugs.  Current Outpatient Medications on File Prior to Visit  Medication Sig Dispense Refill  . ALPRAZolam (XANAX) 0.25 MG tablet Take 1 tablet (0.25 mg total) by mouth 2 (two) times daily as needed for anxiety. 45 tablet 2  . atorvastatin (LIPITOR) 20 MG tablet Take 1 tablet (20 mg total) by mouth daily. 30 tablet 0  . levofloxacin (LEVAQUIN) 500 MG tablet Take 1 tablet (500 mg total) by mouth daily. 14 tablet 0  . potassium chloride (K-DUR) 10 MEQ tablet Take 1 tablet (10 mEq  total) by mouth daily. 30 tablet 0  . RABEprazole (ACIPHEX) 20 MG tablet Take 1 tablet (20 mg total) by mouth daily. 90 tablet 1  . SYNTHROID 125 MCG tablet TAKE ONE TABLET BY MOUTH ONCE DAILY BEFORE BREAKFAST 30 tablet 11   No current facility-administered medications on file prior to visit.      Objective:  Objective  Physical Exam  Constitutional: She is oriented to person, place, and time. She appears well-developed and well-nourished.  HENT:  Head: Normocephalic and atraumatic.  Eyes: Conjunctivae and EOM are normal.  Neck: Normal range of motion. Neck supple. No JVD present. Carotid bruit is not present. No thyromegaly present.  Cardiovascular: Normal rate, regular rhythm and normal heart sounds.  No murmur heard. Pulmonary/Chest: Effort normal and breath sounds normal. No respiratory distress. She has no wheezes. She has no rales. She exhibits no tenderness.  Musculoskeletal: She exhibits no edema.  Neurological: She is alert and oriented to person, place, and time.  Psychiatric: She has a normal mood and affect.  Nursing note and vitals reviewed.  BP 138/88   Pulse 62   Temp 98.7 F (37.1 C) (Oral)   Ht 5\' 6"  (1.676 m)   Wt (!) 305 lb (138.3 kg)   SpO2 98%   BMI 49.23 kg/m  Wt Readings from Last 3 Encounters:  09/05/17 (!) 305 lb (138.3 kg)  07/25/17 (!) 305 lb (138.3 kg)  07/18/17 (!) 307 lb 9.6 oz (139.5 kg)     Lab Results  Component Value Date   WBC 6.7 09/09/2016   HGB 13.8 09/09/2016  HCT 41.2 09/09/2016   PLT 257.0 09/09/2016   GLUCOSE 102 (H) 07/25/2017   CHOL 140 07/25/2017   TRIG 71.0 07/25/2017   HDL 38.50 (L) 07/25/2017   LDLDIRECT 179.1 09/16/2012   LDLCALC 88 07/25/2017   ALT 16 07/25/2017   AST 16 07/25/2017   NA 140 07/25/2017   K 3.7 07/25/2017   CL 101 07/25/2017   CREATININE 0.75 07/25/2017   BUN 12 07/25/2017   CO2 34 (H) 07/25/2017   TSH 1.186 10/10/2015   HGBA1C 6.4 07/25/2017   MICROALBUR 8.2 (H) 07/14/2015    Dg  Tibia/fibula Right  Result Date: 06/30/2017 CLINICAL DATA:  Right lower leg cellulitis. EXAM: RIGHT TIBIA AND FIBULA - 2 VIEW COMPARISON:  Left knee and ankle x-rays from 04/13/2015. FINDINGS: No fracture. No worrisome lytic or sclerotic osseous abnormality. No erosive or destructive lesion. IMPRESSION: Negative. Electronically Signed   By: Misty Stanley M.D.   On: 06/30/2017 16:48     Assessment & Plan:  Plan  I have discontinued Jericka M. Negron's amLODipine. I am also having her start on amLODipine. Additionally, I am having her maintain her ALPRAZolam, SYNTHROID, RABEprazole, potassium chloride, atorvastatin, levofloxacin, and metoprolol succinate.  Meds ordered this encounter  Medications  . amLODipine (NORVASC) 10 MG tablet    Sig: Take 1 tablet (10 mg total) by mouth daily.    Dispense:  90 tablet    Refill:  1  . metoprolol succinate (TOPROL-XL) 50 MG 24 hr tablet    Sig: TAKE 3 TABLETS BY MOUTH ONCE DAILY IMMEDIATELY  FOLLOWING  A  MEAL    Dispense:  270 tablet    Refill:  1    Problem List Items Addressed This Visit      Unprioritized   Essential hypertension    Poorly controlled will alter medications, encouraged DASH diet, minimize caffeine and obtain adequate sleep. Report concerning symptoms and follow up as directed and as needed      Relevant Medications   amLODipine (NORVASC) 10 MG tablet   metoprolol succinate (TOPROL-XL) 50 MG 24 hr tablet      Follow-up: Return in about 3 months (around 12/04/2017), or if symptoms worsen or fail to improve, for hypertension, hyperlipidemia, fasting.  Ann Held, DO

## 2017-09-10 ENCOUNTER — Other Ambulatory Visit: Payer: Self-pay | Admitting: Family Medicine

## 2017-09-10 DIAGNOSIS — R601 Generalized edema: Secondary | ICD-10-CM

## 2017-09-10 DIAGNOSIS — I10 Essential (primary) hypertension: Secondary | ICD-10-CM

## 2017-09-10 NOTE — Telephone Encounter (Signed)
Was taken off patient list because she was not taking it.  Do you want to refill for her.  She was just seen ,not sure if she said anything about swelling.

## 2017-09-20 ENCOUNTER — Other Ambulatory Visit: Payer: Self-pay | Admitting: Family Medicine

## 2017-09-20 DIAGNOSIS — E039 Hypothyroidism, unspecified: Secondary | ICD-10-CM

## 2017-10-17 ENCOUNTER — Other Ambulatory Visit: Payer: Self-pay | Admitting: Family Medicine

## 2017-10-17 NOTE — Telephone Encounter (Signed)
Called Pt to confirm 90day refills and which pharmacy preferred. Pt did not answer and was unable to leave VM

## 2017-11-03 ENCOUNTER — Other Ambulatory Visit: Payer: Self-pay | Admitting: Family Medicine

## 2017-11-15 ENCOUNTER — Other Ambulatory Visit: Payer: Self-pay | Admitting: Family Medicine

## 2017-11-15 DIAGNOSIS — I10 Essential (primary) hypertension: Secondary | ICD-10-CM

## 2018-02-05 ENCOUNTER — Telehealth: Payer: Self-pay | Admitting: *Deleted

## 2018-02-05 NOTE — Telephone Encounter (Signed)
Copied from [place] [id number]. Topic: General - Other >> Feb 04, 2018  3:45 PM Vernona Rieger wrote: Reason for CRM: Patient said that [facility] sent her a letter and said that Dr Carollee Herter was no longer in her network. Please advise

## 2018-02-06 NOTE — Telephone Encounter (Signed)
Patient notified that this should be fixed.

## 2018-05-25 ENCOUNTER — Other Ambulatory Visit: Payer: Self-pay | Admitting: Family Medicine

## 2018-05-25 DIAGNOSIS — R601 Generalized edema: Secondary | ICD-10-CM

## 2018-05-25 DIAGNOSIS — I10 Essential (primary) hypertension: Secondary | ICD-10-CM

## 2018-05-25 NOTE — Telephone Encounter (Signed)
30 day supply of furosemide sent to pharmacy. Pt was due for follow up with PCP in February and is past due.  Please call pt to schedule appt with Dr Carollee Herter soon. Thanks!

## 2018-05-26 NOTE — Telephone Encounter (Signed)
Appt scheduled for 06/04/18.

## 2018-06-04 ENCOUNTER — Ambulatory Visit (INDEPENDENT_AMBULATORY_CARE_PROVIDER_SITE_OTHER): Payer: 59 | Admitting: Family Medicine

## 2018-06-04 ENCOUNTER — Encounter: Payer: Self-pay | Admitting: Family Medicine

## 2018-06-04 VITALS — BP 133/51 | HR 59 | Temp 98.5°F | Resp 16 | Ht 66.0 in | Wt 291.6 lb

## 2018-06-04 DIAGNOSIS — R600 Localized edema: Secondary | ICD-10-CM

## 2018-06-04 DIAGNOSIS — E785 Hyperlipidemia, unspecified: Secondary | ICD-10-CM | POA: Diagnosis not present

## 2018-06-04 DIAGNOSIS — E039 Hypothyroidism, unspecified: Secondary | ICD-10-CM

## 2018-06-04 DIAGNOSIS — Z23 Encounter for immunization: Secondary | ICD-10-CM | POA: Diagnosis not present

## 2018-06-04 DIAGNOSIS — Z79899 Other long term (current) drug therapy: Secondary | ICD-10-CM

## 2018-06-04 DIAGNOSIS — I10 Essential (primary) hypertension: Secondary | ICD-10-CM

## 2018-06-04 LAB — LIPID PANEL
CHOL/HDL RATIO: 3
Cholesterol: 151 mg/dL (ref 0–200)
HDL: 43.6 mg/dL (ref >39.00)
LDL CALC: 95 mg/dL (ref 0–99)
NonHDL: 107.83
TRIGLYCERIDES: 66 mg/dL (ref 0.0–149.0)
VLDL: 13.2 mg/dL (ref 0.0–40.0)

## 2018-06-04 LAB — COMPREHENSIVE METABOLIC PANEL
ALT: 13 U/L (ref 0–35)
AST: 12 U/L (ref 0–37)
Albumin: 4.1 g/dL (ref 3.5–5.2)
Alkaline Phosphatase: 92 U/L (ref 39–117)
BUN: 12 mg/dL (ref 6–23)
CALCIUM: 9 mg/dL (ref 8.4–10.5)
CHLORIDE: 104 meq/L (ref 96–112)
CO2: 31 meq/L (ref 19–32)
Creatinine, Ser: 0.71 mg/dL (ref 0.40–1.20)
GFR: 107.14 mL/min (ref >60.00)
GLUCOSE: 99 mg/dL (ref 70–99)
POTASSIUM: 3.9 meq/L (ref 3.5–5.1)
Sodium: 141 mEq/L (ref 135–145)
Total Bilirubin: 0.6 mg/dL (ref 0.2–1.2)
Total Protein: 7.3 g/dL (ref 6.0–8.3)

## 2018-06-04 LAB — TSH: TSH: 1.42 u[IU]/mL (ref 0.35–4.50)

## 2018-06-04 NOTE — Assessment & Plan Note (Signed)
con't meds  Check labs 

## 2018-06-04 NOTE — Patient Instructions (Signed)

## 2018-06-04 NOTE — Assessment & Plan Note (Signed)
Discussed diet and exercise Refer to healthy weight and wellness

## 2018-06-04 NOTE — Progress Notes (Signed)
Patient ID: Tracey Page, female    DOB: 09-04-1956  Age: 62 y.o. MRN: [id number]    Subjective:  Subjective  HPI Tracey Page presents for f/u and med refills   No complaints.    Review of Systems  Constitutional: Negative for activity change, appetite change, chills and fever.  HENT: Negative for congestion and hearing loss.   Eyes: Negative for discharge.  Respiratory: Negative for cough and shortness of breath.   Cardiovascular: Positive for leg swelling. Negative for chest pain and palpitations.  Gastrointestinal: Negative for abdominal distention, abdominal pain, blood in stool, constipation, diarrhea, nausea and vomiting.  Genitourinary: Negative for difficulty urinating, dyspareunia, dysuria, flank pain, frequency, genital sores, hematuria, menstrual problem, pelvic pain, urgency, vaginal discharge and vaginal pain.  Musculoskeletal: Negative for back pain and myalgias.  Skin: Negative for rash.  Allergic/Immunologic: Negative for environmental allergies.  Neurological: Negative for dizziness, weakness and headaches.  Hematological: Does not bruise/bleed easily.  Psychiatric/Behavioral: Negative for suicidal ideas. The patient is not nervous/anxious.     History Past Medical History:  Diagnosis Date  . Hyperlipidemia   . Hypertension   . Thyroid disease     She has a past surgical history that includes Ectopic pregnancy surgery.   Her family history includes Aneurysm in her father; Arthritis in her mother; Coronary artery disease in her unknown relative; Diabetes in her maternal grandfather; Hyperlipidemia in her unknown relative; Hypertension in her unknown relative.She reports that she has quit smoking. She has never used smokeless tobacco. She reports that she drinks alcohol. She reports that she does not use drugs.  Current Outpatient Medications on File Prior to Visit  Medication Sig Dispense Refill  . ALPRAZolam (XANAX) 0.25 MG tablet Take 1 tablet (0.25 mg  total) by mouth 2 (two) times daily as needed for anxiety. 45 tablet 2  . amLODipine (NORVASC) 10 MG tablet TAKE 1 TABLET BY MOUTH ONCE DAILY 90 tablet 1  . atorvastatin (LIPITOR) 20 MG tablet TAKE 1 TABLET BY MOUTH ONCE DAILY 90 tablet 3  . furosemide (LASIX) 20 MG tablet TAKE 1 TO 2 TABLETS BY MOUTH ONCE DAILY FOR EDEMA 60 tablet 0  . metoprolol succinate (TOPROL-XL) 50 MG 24 hr tablet TAKE 3 TABLETS BY MOUTH ONCE DAILY IMMEDIATELY  FOLLOWING  A  MEAL 270 tablet 1  . potassium chloride (K-DUR) 10 MEQ tablet TAKE 1 TABLET BY MOUTH ONCE DAILY 30 tablet 0  . RABEprazole (ACIPHEX) 20 MG tablet Take 1 tablet (20 mg total) by mouth daily. 90 tablet 1  . SYNTHROID 125 MCG tablet TAKE ONE TABLET BY MOUTH ONCE DAILY BEFORE  BREAKFAST 30 tablet 11   No current facility-administered medications on file prior to visit.      Objective:  Objective  Physical Exam  Constitutional: She is oriented to person, place, and time. She appears well-developed and well-nourished.  HENT:  Head: Normocephalic and atraumatic.  Eyes: Conjunctivae and EOM are normal.  Neck: Normal range of motion. Neck supple. No JVD present. Carotid bruit is not present. No thyromegaly present.  Cardiovascular: Normal rate, regular rhythm and normal heart sounds.  No murmur heard. Pulmonary/Chest: Effort normal and breath sounds normal. No respiratory distress. She has no wheezes. She has no rales. She exhibits no tenderness.  Musculoskeletal: She exhibits edema.  B/l low ext pitting edema  +1   Neurological: She is alert and oriented to person, place, and time.  Psychiatric: She has a normal mood and affect. Her behavior is normal. Judgment  and thought content normal.  Nursing note and vitals reviewed.  BP (!) 133/51 (BP Location: Right Arm, Cuff Size: Normal)   Pulse (!) 59   Temp 98.5 F (36.9 C) (Oral)   Resp 16   Ht 5\' 6"  (1.676 m)   Wt 291 lb 9.6 oz (132.3 kg)   SpO2 100%   BMI 47.07 kg/m  Wt Readings from Last 3  Encounters:  06/04/18 291 lb 9.6 oz (132.3 kg)  09/05/17 (!) 305 lb (138.3 kg)  07/25/17 (!) 305 lb (138.3 kg)     Lab Results  Component Value Date   WBC 6.7 09/09/2016   HGB 13.8 09/09/2016   HCT 41.2 09/09/2016   PLT 257.0 09/09/2016   GLUCOSE 99 06/04/2018   CHOL 151 06/04/2018   TRIG 66.0 06/04/2018   HDL 43.60 06/04/2018   LDLDIRECT 179.1 09/16/2012   LDLCALC 95 06/04/2018   ALT 13 06/04/2018   AST 12 06/04/2018   NA 141 06/04/2018   K 3.9 06/04/2018   CL 104 06/04/2018   CREATININE 0.71 06/04/2018   BUN 12 06/04/2018   CO2 31 06/04/2018   TSH 1.42 06/04/2018   HGBA1C 6.4 07/25/2017   MICROALBUR 8.2 (H) 07/14/2015    Dg Tibia/fibula Right  Result Date: 06/30/2017 CLINICAL DATA:  Right lower leg cellulitis. EXAM: RIGHT TIBIA AND FIBULA - 2 VIEW COMPARISON:  Left knee and ankle x-rays from 04/13/2015. FINDINGS: No fracture. No worrisome lytic or sclerotic osseous abnormality. No erosive or destructive lesion. IMPRESSION: Negative. Electronically Signed   By: Misty Stanley M.D.   On: 06/30/2017 16:48     Assessment & Plan:  Plan  I have discontinued Jamaya M. Poucher's levofloxacin. I am also having her maintain her ALPRAZolam, RABEprazole, metoprolol succinate, SYNTHROID, atorvastatin, potassium chloride, amLODipine, and furosemide.  No orders of the defined types were placed in this encounter.   Problem List Items Addressed This Visit      Unprioritized   Essential hypertension - Primary    Well controlled, no changes to meds. Encouraged heart healthy diet such as the DASH diet and exercise as tolerated.       Relevant Orders   Comprehensive metabolic panel (Completed)   Hyperlipidemia LDL goal <100    Encouraged heart healthy diet, increase exercise, avoid trans fats, consider a krill oil cap daily      Relevant Orders   Lipid panel (Completed)   Hypothyroidism    con't meds Check labs      Relevant Orders   TSH (Completed)   Severe obesity (BMI  >= 40) (HCC)    Discussed diet and exercise Refer to healthy weight and wellness       Other Visit Diagnoses    Obesity, Class III, BMI 40-49.9 (morbid obesity) ([place])       Relevant Orders   Amb Ref to Medical Weight Management   High risk medication use       Relevant Orders   Pain Mgmt, Profile 8 w/Conf, U      Follow-up: Return in about 6 months (around 12/05/2018) for annual exam, fasting.  Ann Held, DO

## 2018-06-04 NOTE — Assessment & Plan Note (Signed)
Well controlled, no changes to meds. Encouraged heart healthy diet such as the DASH diet and exercise as tolerated.  °

## 2018-06-04 NOTE — Assessment & Plan Note (Signed)
Encouraged heart healthy diet, increase exercise, avoid trans fats, consider a krill oil cap daily 

## 2018-06-04 NOTE — Assessment & Plan Note (Signed)
Elevated legs Compression socks Con't meds Drink more water

## 2018-06-05 LAB — PAIN MGMT, PROFILE 8 W/CONF, U
6 ACETYLMORPHINE: NEGATIVE ng/mL (ref <10)
ALCOHOL METABOLITES: NEGATIVE ng/mL (ref <500)
Amphetamines: NEGATIVE ng/mL (ref <500)
Benzodiazepines: NEGATIVE ng/mL (ref <100)
Buprenorphine, Urine: NEGATIVE ng/mL (ref <5)
COCAINE METABOLITE: NEGATIVE ng/mL (ref <150)
CREATININE: 217.2 mg/dL
MDMA: NEGATIVE ng/mL (ref <500)
Marijuana Metabolite: NEGATIVE ng/mL (ref <20)
OPIATES: NEGATIVE ng/mL (ref <100)
Oxidant: NEGATIVE ug/mL (ref <200)
Oxycodone: NEGATIVE ng/mL (ref <100)
PH: 6.84 (ref 4.5–9.0)

## 2018-06-05 NOTE — Addendum Note (Signed)
Addended by: Wynonia Musty A on: 06/05/2018 10:06 AM   Modules accepted: Orders

## 2018-06-05 NOTE — Addendum Note (Signed)
Addended by: Kem Boroughs D on: 06/05/2018 10:20 AM   Modules accepted: Orders

## 2018-07-29 ENCOUNTER — Encounter: Payer: Self-pay | Admitting: Gastroenterology

## 2018-07-29 ENCOUNTER — Encounter (INDEPENDENT_AMBULATORY_CARE_PROVIDER_SITE_OTHER): Payer: Self-pay

## 2018-08-07 ENCOUNTER — Ambulatory Visit: Payer: Self-pay

## 2018-08-12 ENCOUNTER — Ambulatory Visit (INDEPENDENT_AMBULATORY_CARE_PROVIDER_SITE_OTHER): Payer: 59

## 2018-08-12 DIAGNOSIS — Z23 Encounter for immunization: Secondary | ICD-10-CM

## 2018-08-30 ENCOUNTER — Other Ambulatory Visit: Payer: Self-pay | Admitting: Family Medicine

## 2018-08-30 DIAGNOSIS — I10 Essential (primary) hypertension: Secondary | ICD-10-CM

## 2018-08-30 DIAGNOSIS — R601 Generalized edema: Secondary | ICD-10-CM

## 2018-09-13 ENCOUNTER — Other Ambulatory Visit: Payer: Self-pay | Admitting: Family Medicine

## 2018-09-13 DIAGNOSIS — I10 Essential (primary) hypertension: Secondary | ICD-10-CM

## 2018-09-14 ENCOUNTER — Other Ambulatory Visit: Payer: Self-pay | Admitting: Family Medicine

## 2018-09-14 DIAGNOSIS — I10 Essential (primary) hypertension: Secondary | ICD-10-CM

## 2018-09-15 ENCOUNTER — Other Ambulatory Visit: Payer: Self-pay | Admitting: *Deleted

## 2018-09-15 DIAGNOSIS — I10 Essential (primary) hypertension: Secondary | ICD-10-CM

## 2018-09-15 MED ORDER — AMLODIPINE BESYLATE 10 MG PO TABS: 10.0000 mg | ORAL_TABLET | Freq: Every day | ORAL | 1 refills | Status: DC

## 2018-09-21 NOTE — Telephone Encounter (Signed)
Copied from [place] [id number]. Topic: Quick Communication - Rx Refill/Question >> Sep 21, 2018  6:02 PM Jackey Loge, Lenna Sciara wrote: Medication: metoprolol succinate (TOPROL-XL) 50 MG 24   Has the patient contacted their pharmacy? Yes.   (Agent: If no, request that the patient contact the pharmacy for the refill.) (Agent: If yes, when and what did the pharmacy advise?) Pharmacy states that they have sent refill request to office 3 times in the last week with no reply  Preferred Pharmacy (with phone number or street name):   Agent: Please be advised that RX refills may take up to 3 business days. We ask that you follow-up with your pharmacy.

## 2018-09-23 NOTE — Telephone Encounter (Signed)
Patient has been waiting 8 days for her metoprolol succinate (TOPROL-XL) 50 MG 24 hr tablet medication and she is completely out.  She would like that called in ASAP and sent to her preferred pharmacy [facility] on [address].

## 2018-09-23 NOTE — Telephone Encounter (Signed)
Requested Prescriptions  Pending Prescriptions Disp Refills  . metoprolol succinate (TOPROL-XL) 50 MG 24 hr tablet [Pharmacy Med Name: METOPROLOL ER 50MG   TAB] 270 tablet 1    Sig: TAKE 3 TABLETS BY MOUTH ONCE DAILY IMMEDIATELY  FOLLOWING  A  MEAL     Cardiovascular:  Beta Blockers Passed - 09/23/2018  4:03 PM      Passed - Last BP in normal range    BP Readings from Last 1 Encounters:  06/04/18 (!) 133/51         Passed - Last Heart Rate in normal range    Pulse Readings from Last 1 Encounters:  06/04/18 (!) 59         Passed - Valid encounter within last 6 months    Recent Outpatient Visits          3 months ago Need for shingles vaccine   [facility] [place], DO   1 year ago Essential hypertension   [facility] at [place], DO   1 year ago Wound of right lower extremity, subsequent encounter   [facility] at [place], DO   1 year ago Hyperlipidemia LDL goal <100   [facility] at [place], DO   1 year ago Cellulitis of right lower extremity   [facility] at [place], DO      Future Appointments            In 2 months [place], [place] at [employer], [facility]

## 2018-10-08 ENCOUNTER — Ambulatory Visit (INDEPENDENT_AMBULATORY_CARE_PROVIDER_SITE_OTHER): Payer: 59

## 2018-10-08 DIAGNOSIS — Z23 Encounter for immunization: Secondary | ICD-10-CM

## 2018-10-23 ENCOUNTER — Other Ambulatory Visit: Payer: Self-pay | Admitting: Family Medicine

## 2018-10-23 DIAGNOSIS — E039 Hypothyroidism, unspecified: Secondary | ICD-10-CM

## 2018-11-11 ENCOUNTER — Encounter: Payer: Self-pay | Admitting: Gastroenterology

## 2018-11-11 ENCOUNTER — Ambulatory Visit: Payer: 59 | Admitting: Gastroenterology

## 2018-11-11 VITALS — BP 130/86 | HR 68 | Ht 64.5 in | Wt 307.0 lb

## 2018-11-11 DIAGNOSIS — K921 Melena: Secondary | ICD-10-CM | POA: Diagnosis not present

## 2018-11-11 DIAGNOSIS — K5900 Constipation, unspecified: Secondary | ICD-10-CM

## 2018-11-11 MED ORDER — PEG 3350-KCL-NA BICARB-NACL 420 G PO SOLR: 4000.0000 mL | ORAL | 0 refills | Status: DC

## 2018-11-11 NOTE — Progress Notes (Signed)
HPI: This is a very pleasant 63 year old woman who was referred by her gynecologist due to Hemoccult positive stools  Chief complaint is Hemoccult positive stools  I did a colonoscopy for her September 2009 for routine risk colon cancer screening.  She had diverticulosis, hemorrhoids and several small polyps that were not precancerous on pathology.  I recommended repeat colon cancer screening at 10-year interval.  We sent a reminder letter about this screening recommendation 4 months ago.  Routine gyne appt, found heme + stool on home testing.  She has minor constipation.  Has BMs 3 times per week.  Sometimes has to push and strain.  She never sees overt blood in her stool.  She admits that her intermittent constipation is stress related, diet related.  No unintentional weight loss  BMI 52  Review of systems: Pertinent positive and negative review of systems were noted in the above HPI section. All other review negative.   Past Medical History:  Diagnosis Date  . Hyperlipidemia   . Hypertension   . Thyroid disease     Past Surgical History:  Procedure Laterality Date  . ECTOPIC PREGNANCY SURGERY      Current Outpatient Medications  Medication Sig Dispense Refill  . ALPRAZolam (XANAX) 0.25 MG tablet Take 1 tablet (0.25 mg total) by mouth 2 (two) times daily as needed for anxiety. (Patient taking differently: Take 0.25 mg by mouth as needed for anxiety. ) 45 tablet 2  . amLODipine (NORVASC) 10 MG tablet Take 1 tablet (10 mg total) by mouth daily. 90 tablet 1  . atorvastatin (LIPITOR) 20 MG tablet TAKE 1 TABLET BY MOUTH ONCE DAILY 90 tablet 3  . furosemide (LASIX) 20 MG tablet TAKE 1 TO 2 TABLETS BY MOUTH ONCE DAILY FOR  EDEMA  .  MUST  HAVE  OFFICE  VISIT  BEFORE  FURTHER  REFILLS. 60 tablet 0  . metoprolol succinate (TOPROL-XL) 50 MG 24 hr tablet TAKE 3 TABLETS BY MOUTH ONCE DAILY IMMEDIATELY  FOLLOWING  A  MEAL 270 tablet 1  . potassium chloride (K-DUR) 10 MEQ tablet TAKE 1  TABLET BY MOUTH ONCE DAILY 30 tablet 0  . RABEprazole (ACIPHEX) 20 MG tablet Take 1 tablet (20 mg total) by mouth daily. (Patient taking differently: Take 20 mg by mouth as needed. ) 90 tablet 1  . SYNTHROID 125 MCG tablet TAKE 1 TABLET BY MOUTH ONCE DAILY BEFORE BREAKFAST 30 tablet 2   No current facility-administered medications for this visit.     Allergies as of 11/11/2018 - Review Complete 11/11/2018  Allergen Reaction Noted  . Diovan [valsartan] Other (See Comments) 09/09/2016  . Iodine  01/06/2012  . Shellfish allergy  01/06/2012  . Sulfonamide derivatives  04/26/2008    Family History  Problem Relation Age of Onset  . Arthritis Mother   . Aneurysm Father   . Hyperlipidemia Other   . Hypertension Other   . Diabetes Maternal Grandfather   . Coronary artery disease Other     Social History   Socioeconomic History  . Marital status: Single    Spouse name: Not on file  . Number of children: Not on file  . Years of education: Not on file  . Highest education level: Not on file  Occupational History  . Not on file  Social Needs  . Financial resource strain: Not on file  . Food insecurity:    Worry: Not on file    Inability: Not on file  . Transportation needs:  Medical: Not on file    Non-medical: Not on file  Tobacco Use  . Smoking status: Former Research scientist (life sciences)  . Smokeless tobacco: Never Used  Substance and Sexual Activity  . Alcohol use: Yes    Comment: socially, not even on weekly basis  . Drug use: No  . Sexual activity: Yes    Birth control/protection: Post-menopausal  Lifestyle  . Physical activity:    Days per week: Not on file    Minutes per session: Not on file  . Stress: Not on file  Relationships  . Social connections:    Talks on phone: Not on file    Gets together: Not on file    Attends religious service: Not on file    Active member of club or organization: Not on file    Attends meetings of clubs or organizations: Not on file    Relationship  status: Not on file  . Intimate partner violence:    Fear of current or ex partner: Not on file    Emotionally abused: Not on file    Physically abused: Not on file    Forced sexual activity: Not on file  Other Topics Concern  . Not on file  Social History Narrative  . Not on file     Physical Exam: BP 130/86 (BP Location: Left Wrist, Patient Position: Sitting, Cuff Size: Normal)   Pulse 68   Ht 5' 4.5" (1.638 m) Comment: height measured without shoes  Wt (!) 307 lb (139.3 kg)   BMI 51.88 kg/m  Constitutional: generally well-appearing Psychiatric: alert and oriented x3 Eyes: extraocular movements intact Mouth: oral pharynx moist, no lesions Neck: supple no lymphadenopathy Cardiovascular: heart regular rate and rhythm Lungs: clear to auscultation bilaterally Abdomen: soft, nontender, nondistended, no obvious ascites, no peritoneal signs, normal bowel sounds Extremities: no lower extremity edema bilaterally Skin: no lesions on visible extremities   Assessment and plan: 64 y.o. female with Hemoccult positive stool, mild constipation, BMI greater than 50  I recommended a colonoscopy to evaluate her Hemoccult positive stool.  Given her BMI greater than 50 it is safest that this be performed at [facility] rather than our outpatient endoscopy center.  I recommended she try fiber supplements on a daily basis for her mild intermittent constipation.  I see no reason for any further blood testing or imaging studies prior to the colonoscopy.    Please see the "Patient Instructions" section for addition details about the plan.   Owens Loffler, MD [facility] 11/11/2018, 8:53 AM  Cc: Carollee Herter, Alferd Apa, *

## 2018-11-11 NOTE — Patient Instructions (Addendum)
You will be set up for a colonoscopy for heme positive stools. (WL, BMI >50) Please start taking citrucel (orange flavored) powder fiber supplement.  This may cause some bloating at first but that usually goes away. Begin with a small spoonful and work your way up to a large, heaping spoonful daily over a week.  Thank you for entrusting me with your care and choosing [facility].  Dr Ardis Hughs

## 2018-11-23 ENCOUNTER — Other Ambulatory Visit: Payer: Self-pay | Admitting: Family Medicine

## 2018-12-06 DIAGNOSIS — Z860101 Personal history of adenomatous and serrated colon polyps: Secondary | ICD-10-CM

## 2018-12-06 DIAGNOSIS — Z86010 Personal history of colonic polyps: Secondary | ICD-10-CM

## 2018-12-06 HISTORY — DX: Personal history of adenomatous and serrated colon polyps: Z86.0101

## 2018-12-06 HISTORY — DX: Personal history of colonic polyps: Z86.010

## 2018-12-11 ENCOUNTER — Encounter: Payer: Self-pay | Admitting: Family Medicine

## 2018-12-17 ENCOUNTER — Ambulatory Visit (HOSPITAL_COMMUNITY)
Admission: RE | Admit: 2018-12-17 | Discharge: 2018-12-17 | Disposition: A | Discharge status: Home / Self Care | Payer: 59 | Attending: Gastroenterology | Admitting: Gastroenterology

## 2018-12-17 ENCOUNTER — Ambulatory Visit (HOSPITAL_COMMUNITY): Payer: 59 | Admitting: Anesthesiology

## 2018-12-17 ENCOUNTER — Encounter (HOSPITAL_COMMUNITY): Payer: Self-pay | Admitting: *Deleted

## 2018-12-17 ENCOUNTER — Encounter (HOSPITAL_COMMUNITY)
Admission: RE | Disposition: A | Discharge status: Home / Self Care | Payer: Self-pay | Source: Home / Self Care | Attending: Gastroenterology

## 2018-12-17 DIAGNOSIS — Z79890 Hormone replacement therapy (postmenopausal): Secondary | ICD-10-CM | POA: Insufficient documentation

## 2018-12-17 DIAGNOSIS — K649 Unspecified hemorrhoids: Secondary | ICD-10-CM

## 2018-12-17 DIAGNOSIS — E785 Hyperlipidemia, unspecified: Secondary | ICD-10-CM | POA: Insufficient documentation

## 2018-12-17 DIAGNOSIS — D122 Benign neoplasm of ascending colon: Secondary | ICD-10-CM | POA: Diagnosis not present

## 2018-12-17 DIAGNOSIS — Z87891 Personal history of nicotine dependence: Secondary | ICD-10-CM | POA: Insufficient documentation

## 2018-12-17 DIAGNOSIS — I10 Essential (primary) hypertension: Secondary | ICD-10-CM | POA: Diagnosis not present

## 2018-12-17 DIAGNOSIS — K921 Melena: Secondary | ICD-10-CM | POA: Diagnosis not present

## 2018-12-17 DIAGNOSIS — E079 Disorder of thyroid, unspecified: Secondary | ICD-10-CM | POA: Diagnosis not present

## 2018-12-17 DIAGNOSIS — D120 Benign neoplasm of cecum: Secondary | ICD-10-CM | POA: Insufficient documentation

## 2018-12-17 DIAGNOSIS — K635 Polyp of colon: Secondary | ICD-10-CM

## 2018-12-17 DIAGNOSIS — K5730 Diverticulosis of large intestine without perforation or abscess without bleeding: Secondary | ICD-10-CM

## 2018-12-17 DIAGNOSIS — K648 Other hemorrhoids: Secondary | ICD-10-CM | POA: Diagnosis not present

## 2018-12-17 DIAGNOSIS — R195 Other fecal abnormalities: Secondary | ICD-10-CM | POA: Insufficient documentation

## 2018-12-17 DIAGNOSIS — K5900 Constipation, unspecified: Secondary | ICD-10-CM

## 2018-12-17 DIAGNOSIS — Z79899 Other long term (current) drug therapy: Secondary | ICD-10-CM | POA: Diagnosis not present

## 2018-12-17 HISTORY — PX: COLONOSCOPY WITH PROPOFOL: SHX5780

## 2018-12-17 HISTORY — PX: POLYPECTOMY: SHX5525

## 2018-12-17 SURGERY — COLONOSCOPY WITH PROPOFOL
Anesthesia: Monitor Anesthesia Care

## 2018-12-17 MED ORDER — LACTATED RINGERS IV SOLN
INTRAVENOUS | Status: DC
  Administered 2018-12-17: 09:00:00 via INTRAVENOUS

## 2018-12-17 MED ORDER — PROPOFOL 500 MG/50ML IV EMUL
INTRAVENOUS | Status: DC | PRN
  Administered 2018-12-17: 200 ug/kg/min via INTRAVENOUS

## 2018-12-17 MED ORDER — PROPOFOL 10 MG/ML IV BOLUS
INTRAVENOUS | Status: AC
  Filled 2018-12-17: qty 40

## 2018-12-17 MED ORDER — SODIUM CHLORIDE 0.9 % IV SOLN: INTRAVENOUS | Status: DC

## 2018-12-17 SURGICAL SUPPLY — 21 items

## 2018-12-17 NOTE — Anesthesia Postprocedure Evaluation (Signed)
Anesthesia Post Note  Patient: Tracey Page  Procedure(s) Performed: COLONOSCOPY WITH PROPOFOL (N/A ) POLYPECTOMY     Patient location during evaluation: PACU Anesthesia Type: MAC Level of consciousness: awake and alert Pain management: pain level controlled Vital Signs Assessment: post-procedure vital signs reviewed and stable Respiratory status: spontaneous breathing, nonlabored ventilation, respiratory function stable and patient connected to nasal cannula oxygen Cardiovascular status: stable and blood pressure returned to baseline Postop Assessment: no apparent nausea or vomiting Anesthetic complications: no    Last Vitals:  Vitals:   12/17/18 0930 12/17/18 0940  BP: (!) 115/56 131/65  Pulse: 64 (!) 59  Resp: 17 18  Temp:    SpO2: 99% 100%    Last Pain:  Vitals:   12/17/18 0940  TempSrc:   PainSc: 0-No pain                 Paulmichael Schreck S

## 2018-12-17 NOTE — Op Note (Addendum)
[facility] Patient Name: Tracey Page Procedure Date: 12/17/2018 MRN: [id number] Attending MD: Milus Banister , MD Date of Birth: [birth year] CSN: [id number] Age: 63 Admit Type: Outpatient Procedure:                Colonoscopy Indications:              Heme positive stool Providers:                Milus Banister, MD, Cleda Daub, RN, Elspeth Cho Tech., Technician, Karis Juba, CRNA Referring MD:              Medicines:                Monitored Anesthesia Care Complications:            No immediate complications. Estimated blood loss:                            None. Estimated Blood Loss:     Estimated blood loss: none. Procedure:                Pre-Anesthesia Assessment:                           - Prior to the procedure, a History and Physical                            was performed, and patient medications and                            allergies were reviewed. The patient's tolerance of                            previous anesthesia was also reviewed. The risks                            and benefits of the procedure and the sedation                            options and risks were discussed with the patient.                            All questions were answered, and informed consent                            was obtained. Prior Anticoagulants: The patient has                            taken no previous anticoagulant or antiplatelet                            agents. ASA Grade Assessment: IV - A patient with  severe systemic disease that is a constant threat                            to life. After reviewing the risks and benefits,                            the patient was deemed in satisfactory condition to                            undergo the procedure.                           After obtaining informed consent, the colonoscope                            was passed under direct vision.  Throughout the                            procedure, the patient's blood pressure, pulse, and                            oxygen saturations were monitored continuously. The                            CF-HQ190L ([id number]) Olympus colonoscope was                            introduced through the anus and advanced to the the                            cecum, identified by appendiceal orifice and                            ileocecal valve. The colonoscopy was performed                            without difficulty. The patient tolerated the                            procedure well. The quality of the bowel                            preparation was good. The ileocecal valve,                            appendiceal orifice, and rectum were photographed. Scope In: 8:58:40 AM Scope Out: 9:14:15 AM Scope Withdrawal Time: 0 hours 12 minutes 10 seconds  Total Procedure Duration: 0 hours 15 minutes 35 seconds  Findings:      A 15 mm polyp was found in the cecum. The polyp was sessile. The polyp       was removed with a piecemeal technique using a hot snare. Resection and       retrieval were complete. jar 1      A 6 mm polyp was found in the  ascending colon. The polyp was sessile.       The polyp was removed with a cold snare. Resection and retrieval were       complete. jar 2      Multiple small and large-mouthed diverticula were found in the entire       colon.      Internal hemorrhoids were found. The hemorrhoids were small.      The exam was otherwise without abnormality on direct and retroflexion       views. Impression:               - One 15 mm polyp in the cecum, removed piecemeal                            using a hot snare. Resected and retrieved.                           - One 6 mm polyp in the ascending colon, removed                            with a cold snare. Resected and retrieved.                           - Diverticulosis in the entire examined colon.                            - Internal hemorrhoids.                           - The examination was otherwise normal on direct                            and retroflexion views. Moderate Sedation:      Not Applicable - Patient had care per Anesthesia. Recommendation:           - Patient has a contact number available for                            emergencies. The signs and symptoms of potential                            delayed complications were discussed with the                            patient. Return to normal activities tomorrow.                            Written discharge instructions were provided to the                            patient.                           - Resume previous diet.                           - Continue  present medications.                           You will receive a letter within 2-3 weeks with the                            pathology results and my final recommendations.                           If the polyp(s) is proven to be 'pre-cancerous' on                            pathology, you will need repeat colonoscopy in 3                            years. If the polyp(s) is NOT 'precancerous' on                            pathology then you should repeat colon cancer                            screening in 10 years with colonoscopy without need                            for colon cancer screening by any method prior to                            then (including stool testing). Procedure Code(s):        --- Professional ---                           [id number], Colonoscopy, flexible; with removal of                            tumor(s), polyp(s), or other lesion(s) by snare                            technique Diagnosis Code(s):        --- Professional ---                           D12.0, Benign neoplasm of cecum                           D12.2, Benign neoplasm of ascending colon                           K64.8, Other hemorrhoids                           R19.5, Other fecal  abnormalities                           K57.30, Diverticulosis of large intestine without  perforation or abscess without bleeding CPT copyright 2018 American Medical Association. All rights reserved. The codes documented in this report are preliminary and upon coder review may  be revised to meet current compliance requirements. Milus Banister, MD 12/17/2018 9:18:58 AM This report has been signed electronically. Number of Addenda: 0

## 2018-12-17 NOTE — H&P (Signed)
HPI: This is a very pleasant 63 yo woman  Chief complaint is heme + stool  ROS: complete GI ROS as described in HPI, all other review negative.  Constitutional:  No unintentional weight loss   Past Medical History:  Diagnosis Date  . Hyperlipidemia   . Hypertension   . Thyroid disease     Past Surgical History:  Procedure Laterality Date  . ECTOPIC PREGNANCY SURGERY      Current Facility-Administered Medications  Medication Dose Route Frequency Provider Last Rate Last Dose  . 0.9 %  sodium chloride infusion   Intravenous Continuous Milus Banister, MD      . lactated ringers infusion   Intravenous Continuous Milus Banister, MD        Allergies as of 11/11/2018 - Review Complete 11/11/2018  Allergen Reaction Noted  . Diovan [valsartan] Other (See Comments) 09/09/2016  . Iodine  01/06/2012  . Shellfish allergy  01/06/2012  . Sulfonamide derivatives  04/26/2008    Family History  Problem Relation Age of Onset  . Arthritis Mother   . Aneurysm Father   . Hyperlipidemia Other   . Hypertension Other   . Diabetes Maternal Grandfather   . Coronary artery disease Other     Social History   Socioeconomic History  . Marital status: Single    Spouse name: Not on file  . Number of children: Not on file  . Years of education: Not on file  . Highest education level: Not on file  Occupational History  . Not on file  Social Needs  . Financial resource strain: Not on file  . Food insecurity:    Worry: Not on file    Inability: Not on file  . Transportation needs:    Medical: Not on file    Non-medical: Not on file  Tobacco Use  . Smoking status: Former Research scientist (life sciences)  . Smokeless tobacco: Never Used  Substance and Sexual Activity  . Alcohol use: Yes    Comment: socially, not even on weekly basis  . Drug use: No  . Sexual activity: Yes    Birth control/protection: Post-menopausal  Lifestyle  . Physical activity:    Days per week: Not on file    Minutes per session:  Not on file  . Stress: Not on file  Relationships  . Social connections:    Talks on phone: Not on file    Gets together: Not on file    Attends religious service: Not on file    Active member of club or organization: Not on file    Attends meetings of clubs or organizations: Not on file    Relationship status: Not on file  . Intimate partner violence:    Fear of current or ex partner: Not on file    Emotionally abused: Not on file    Physically abused: Not on file    Forced sexual activity: Not on file  Other Topics Concern  . Not on file  Social History Narrative  . Not on file     Physical Exam: There were no vitals taken for this visit. Constitutional: generally well-appearing Psychiatric: alert and oriented x3 Abdomen: soft, nontender, nondistended, no obvious ascites, no peritoneal signs, normal bowel sounds No peripheral edema noted in lower extremities  Assessment and plan: 63 y.o. female with heme + stools  For colonoscopy today.  Please see the "Patient Instructions" section for addition details about the plan.  Owens Loffler, MD [facility] 12/17/2018, 7:57 AM

## 2018-12-17 NOTE — Anesthesia Preprocedure Evaluation (Signed)
Anesthesia Evaluation  Patient identified by MRN, date of birth, ID band Patient awake    Reviewed: Allergy & Precautions, NPO status , Patient's Chart, lab work & pertinent test results  Airway Mallampati: II  TM Distance: >3 FB Neck ROM: Full    Dental no notable dental hx.    Pulmonary neg pulmonary ROS, former smoker,    Pulmonary exam normal breath sounds clear to auscultation       Cardiovascular hypertension, Normal cardiovascular exam Rhythm:Regular Rate:Normal     Neuro/Psych negative neurological ROS  negative psych ROS   GI/Hepatic negative GI ROS, Neg liver ROS,   Endo/Other  Hypothyroidism Morbid obesity  Renal/GU negative Renal ROS  negative genitourinary   Musculoskeletal negative musculoskeletal ROS (+)   Abdominal (+) + obese,   Peds negative pediatric ROS (+)  Hematology negative hematology ROS (+)   Anesthesia Other Findings   Reproductive/Obstetrics negative OB ROS                             Anesthesia Physical Anesthesia Plan  ASA: III  Anesthesia Plan: MAC   Post-op Pain Management:    Induction: Intravenous  PONV Risk Score and Plan:   Airway Management Planned: Simple Face Mask  Additional Equipment:   Intra-op Plan:   Post-operative Plan:   Informed Consent: I have reviewed the patients History and Physical, chart, labs and discussed the procedure including the risks, benefits and alternatives for the proposed anesthesia with the patient or authorized representative who has indicated his/her understanding and acceptance.     Dental advisory given  Plan Discussed with: CRNA and Surgeon  Anesthesia Plan Comments:         Anesthesia Quick Evaluation

## 2018-12-17 NOTE — Transfer of Care (Signed)
Immediate Anesthesia Transfer of Care Note  Patient: Tracey Page  Procedure(s) Performed: COLONOSCOPY WITH PROPOFOL (N/A ) POLYPECTOMY  Patient Location: PACU and Endoscopy Unit  Anesthesia Type:MAC  Level of Consciousness: awake, alert  and oriented  Airway & Oxygen Therapy: Patient Spontanous Breathing and Patient connected to face mask oxygen  Post-op Assessment: Report given to RN and Post -op Vital signs reviewed and stable  Post vital signs: Reviewed and stable  Last Vitals:  Vitals Value Taken Time  BP    Temp    Pulse    Resp    SpO2      Last Pain:  Vitals:   12/17/18 0925  TempSrc: Oral  PainSc: 0-No pain         Complications: No apparent anesthesia complications

## 2018-12-18 ENCOUNTER — Other Ambulatory Visit: Payer: Self-pay | Admitting: Family Medicine

## 2018-12-18 ENCOUNTER — Encounter (HOSPITAL_COMMUNITY): Payer: Self-pay | Admitting: Gastroenterology

## 2018-12-18 DIAGNOSIS — I10 Essential (primary) hypertension: Secondary | ICD-10-CM

## 2018-12-18 DIAGNOSIS — R601 Generalized edema: Secondary | ICD-10-CM

## 2018-12-18 NOTE — Telephone Encounter (Signed)
Please advise 

## 2018-12-18 NOTE — Telephone Encounter (Signed)
Patient last seen you 6 months ago

## 2019-01-14 ENCOUNTER — Ambulatory Visit (INDEPENDENT_AMBULATORY_CARE_PROVIDER_SITE_OTHER): Payer: 59 | Admitting: Family Medicine

## 2019-01-14 ENCOUNTER — Telehealth: Payer: Self-pay

## 2019-01-14 ENCOUNTER — Other Ambulatory Visit: Payer: Self-pay

## 2019-01-14 DIAGNOSIS — I10 Essential (primary) hypertension: Secondary | ICD-10-CM

## 2019-01-14 NOTE — Telephone Encounter (Signed)
Copied from [place] [id number]. Topic: General - Other >> Jan 14, 2019 11:54 AM Sheran Luz wrote: Reason for CRM: Patient states she did not get email link for webvisit. She would like this resent, if possible. Her appointment is today at 1:30.

## 2019-01-21 ENCOUNTER — Encounter: Payer: Self-pay | Admitting: Family Medicine

## 2019-01-21 ENCOUNTER — Ambulatory Visit (INDEPENDENT_AMBULATORY_CARE_PROVIDER_SITE_OTHER): Payer: 59 | Admitting: Family Medicine

## 2019-01-21 ENCOUNTER — Other Ambulatory Visit: Payer: Self-pay

## 2019-01-21 DIAGNOSIS — E1169 Type 2 diabetes mellitus with other specified complication: Secondary | ICD-10-CM | POA: Diagnosis not present

## 2019-01-21 DIAGNOSIS — I10 Essential (primary) hypertension: Secondary | ICD-10-CM

## 2019-01-21 DIAGNOSIS — K219 Gastro-esophageal reflux disease without esophagitis: Secondary | ICD-10-CM

## 2019-01-21 DIAGNOSIS — E785 Hyperlipidemia, unspecified: Secondary | ICD-10-CM

## 2019-01-21 MED ORDER — PANTOPRAZOLE SODIUM 20 MG PO TBEC: 20.0000 mg | DELAYED_RELEASE_TABLET | Freq: Every day | ORAL | 1 refills | Status: DC
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
## 2019-01-22 ENCOUNTER — Telehealth: Payer: Self-pay

## 2019-01-22 NOTE — Telephone Encounter (Signed)
PA initiated via Covermymeds; KEY: [id number]. Awaiting determination.

## 2019-01-22 NOTE — Telephone Encounter (Signed)
Ph# [phone] Ref# [id number]  Requesting call for additional information on clinical guidelines.

## 2019-01-25 NOTE — Telephone Encounter (Signed)
PA approved as of January 23, 2019.

## 2019-02-08 ENCOUNTER — Encounter: Payer: Self-pay | Admitting: Family Medicine

## 2019-02-12 ENCOUNTER — Encounter: Payer: Self-pay | Admitting: Family Medicine

## 2019-02-12 ENCOUNTER — Ambulatory Visit (INDEPENDENT_AMBULATORY_CARE_PROVIDER_SITE_OTHER): Payer: 59 | Admitting: Family Medicine

## 2019-02-12 ENCOUNTER — Other Ambulatory Visit: Payer: Self-pay

## 2019-02-12 ENCOUNTER — Telehealth: Payer: Self-pay | Admitting: *Deleted

## 2019-02-12 VITALS — BP 137/78 | Ht 64.5 in

## 2019-02-12 DIAGNOSIS — J069 Acute upper respiratory infection, unspecified: Secondary | ICD-10-CM | POA: Diagnosis not present

## 2019-02-12 MED ORDER — FLUTICASONE PROPIONATE 50 MCG/ACT NA SUSP: 2.0000 | Freq: Every day | NASAL | 6 refills | Status: DC

## 2019-02-12 MED ORDER — LEVOCETIRIZINE DIHYDROCHLORIDE 5 MG PO TABS: 5.0000 mg | ORAL_TABLET | Freq: Every evening | ORAL | 5 refills | Status: DC

## 2019-02-12 NOTE — Telephone Encounter (Signed)
Copied from [place] [id number]. Topic: Appointment Scheduling - Scheduling Inquiry for Clinic >> Feb 12, 2019  8:55 AM Oneta Rack wrote: Patient would like to schedule virtual appointment with PCP due to runny nose and slight head ache for 4x days, please advise (attempted to call FC 3x)

## 2019-02-12 NOTE — Progress Notes (Signed)
Virtual Visit via Video Note  I connected with Tracey Page on 02/12/19 at 11:00 AM EDT by a video enabled telemedicine application and verified that I am speaking with the correct person using two identifiers.  Location: Patient: home Provider: office   I discussed the limitations of evaluation and management by telemedicine and the availability of in person appointments. The patient expressed understanding and agreed to proceed.  History of Present Illness: Pt  At home c/o runny nose that start Monday.  + congestion  + sinus pressure/ headache  Drainage is clear  No sneezing  No otc meds   no fever / chills   Review of Systems - ENT ROS: positive for - headaches, nasal discharge and sinus pain Respiratory ROS: no cough, shortness of breath, or wheezing Cardiovascular ROS: no chest pain or dyspnea on exertion Gastrointestinal ROS: no abdominal pain, change in bowel habits, or black or bloody stools Observations/Objective: Today's Vitals   02/12/19 1048  BP: 137/78  Height: 5' 4.5" (1.638 m)   Body mass index is 51.88 kg/m. Pt in NAD  Assessment and Plan: 1. Viral upper respiratory tract infection If symptoms worsen or do not improve -- call back flonase and antihistamine called in-- pt instructed to take antihistamine at night  - fluticasone (FLONASE) 50 MCG/ACT nasal spray; Place 2 sprays into both nostrils daily.  Dispense: 16 g; Refill: 6 - levocetirizine (XYZAL) 5 MG tablet; Take 1 tablet (5 mg total) by mouth every evening.  Dispense: 30 tablet; Refill: 5  Follow Up Instructions:    I discussed the assessment and treatment plan with the patient. The patient was provided an opportunity to ask questions and all were answered. The patient agreed with the plan and demonstrated an understanding of the instructions.   The patient was advised to call back or seek an in-person evaluation if the symptoms worsen or if the condition fails to improve as anticipated.  I provided  15 minutes of non-face-to-face time during this encounter.   Ann Held, DO

## 2019-03-05 ENCOUNTER — Other Ambulatory Visit: Payer: Self-pay

## 2019-03-05 ENCOUNTER — Ambulatory Visit (INDEPENDENT_AMBULATORY_CARE_PROVIDER_SITE_OTHER): Payer: 59 | Admitting: Family Medicine

## 2019-03-05 ENCOUNTER — Encounter: Payer: Self-pay | Admitting: Family Medicine

## 2019-03-05 DIAGNOSIS — R600 Localized edema: Secondary | ICD-10-CM | POA: Diagnosis not present

## 2019-03-05 DIAGNOSIS — J302 Other seasonal allergic rhinitis: Secondary | ICD-10-CM | POA: Diagnosis not present

## 2019-03-05 MED ORDER — NONFORMULARY OR COMPOUNDED ITEM: 0 refills | Status: AC

## 2019-03-05 MED ORDER — AZELASTINE HCL 0.1 % NA SOLN: 1.0000 | Freq: Two times a day (BID) | NASAL | 12 refills | Status: DC

## 2019-03-05 NOTE — Progress Notes (Signed)
Virtual Visit via Video Note  I connected with [name] on 03/05/19 at  9:15 AM EDT by a video enabled telemedicine application and verified that I am speaking with the correct person using two identifiers.  Location: Patient: home Provider: office    I discussed the limitations of evaluation and management by telemedicine and the availability of in person appointments. The patient expressed understanding and agreed to proceed.  History of Present Illness: Pt is home still c/o runny nose.  The other symptoms are better.  She is still using xyzal and flonase.  No new symptoms    Observations/Objective No vitals obtained  Pt in NAD   Assessment and Plan: 1. Seasonal allergies con't xyzal and flonase  Add astelin - azelastine (ASTELIN) 0.1 % nasal spray; Place 1 spray into both nostrils 2 (two) times daily. Use in each nostril as directed  Dispense: 30 mL; Refill: 12  2. Lower extremity edema Elevated legs  New rx for compression socks  - NONFORMULARY OR COMPOUNDED ITEM; Compression socks  30-40 mmhg  #1  As directed  Dispense: 1 each; Refill: 0   Follow Up Instructions:    I discussed the assessment and treatment plan with the patient. The patient was provided an opportunity to ask questions and all were answered. The patient agreed with the plan and demonstrated an understanding of the instructions.   The patient was advised to call back or seek an in-person evaluation if the symptoms worsen or if the condition fails to improve as anticipated.  I provided 20 minutes of non-face-to-face time during this encounter.   Ann Held, DO

## 2019-03-27 ENCOUNTER — Other Ambulatory Visit: Payer: Self-pay | Admitting: Family Medicine

## 2019-03-27 DIAGNOSIS — I10 Essential (primary) hypertension: Secondary | ICD-10-CM

## 2019-04-05 ENCOUNTER — Other Ambulatory Visit: Payer: Self-pay | Admitting: Family Medicine

## 2019-04-05 DIAGNOSIS — I10 Essential (primary) hypertension: Secondary | ICD-10-CM

## 2019-04-05 DIAGNOSIS — R601 Generalized edema: Secondary | ICD-10-CM

## 2019-04-12 ENCOUNTER — Encounter (HOSPITAL_BASED_OUTPATIENT_CLINIC_OR_DEPARTMENT_OTHER): Payer: Self-pay | Admitting: *Deleted

## 2019-04-12 ENCOUNTER — Other Ambulatory Visit: Payer: Self-pay

## 2019-04-12 ENCOUNTER — Ambulatory Visit: Payer: Self-pay

## 2019-04-12 ENCOUNTER — Emergency Department (HOSPITAL_BASED_OUTPATIENT_CLINIC_OR_DEPARTMENT_OTHER)
Admission: EM | Admit: 2019-04-12 | Discharge: 2019-04-12 | Disposition: A | Discharge status: Home / Self Care | Payer: 59 | Attending: Emergency Medicine | Admitting: Emergency Medicine

## 2019-04-12 ENCOUNTER — Emergency Department (HOSPITAL_BASED_OUTPATIENT_CLINIC_OR_DEPARTMENT_OTHER): Payer: 59

## 2019-04-12 DIAGNOSIS — E039 Hypothyroidism, unspecified: Secondary | ICD-10-CM | POA: Diagnosis not present

## 2019-04-12 DIAGNOSIS — E119 Type 2 diabetes mellitus without complications: Secondary | ICD-10-CM | POA: Diagnosis not present

## 2019-04-12 DIAGNOSIS — M79662 Pain in left lower leg: Secondary | ICD-10-CM | POA: Diagnosis present

## 2019-04-12 DIAGNOSIS — Z87891 Personal history of nicotine dependence: Secondary | ICD-10-CM | POA: Diagnosis not present

## 2019-04-12 DIAGNOSIS — I10 Essential (primary) hypertension: Secondary | ICD-10-CM | POA: Diagnosis not present

## 2019-04-12 DIAGNOSIS — L539 Erythematous condition, unspecified: Secondary | ICD-10-CM | POA: Insufficient documentation

## 2019-04-12 DIAGNOSIS — M7989 Other specified soft tissue disorders: Secondary | ICD-10-CM

## 2019-04-12 DIAGNOSIS — Z79899 Other long term (current) drug therapy: Secondary | ICD-10-CM | POA: Insufficient documentation

## 2019-04-12 MED ORDER — ENOXAPARIN SODIUM 40 MG/0.4ML ~~LOC~~ SOLN
40.0000 mg | Freq: Once | SUBCUTANEOUS | Status: AC
  Administered 2019-04-12: 40 mg via SUBCUTANEOUS
  Filled 2019-04-12: qty 0.4

## 2019-04-12 MED ORDER — DOXYCYCLINE HYCLATE 100 MG PO CAPS: 100.0000 mg | ORAL_CAPSULE | Freq: Two times a day (BID) | ORAL | 0 refills | Status: DC

## 2019-04-12 NOTE — Telephone Encounter (Signed)
Pt with h/o lymphedema to both legs, woke up this morning with left ankle and foot swelllng. Pt stated the area is lt red in color and warm to touch, Pt c/o  Moderate pain to touch and severe pain upon standing. Pt stated her son pointed a laser thermometer to the area and it registered 100.4. Pt stated that she sits for prolonged periods of time. Pt denies SOB or C/P.  Care advice given and pt stated that her son will take her to ED.  Called [phone] and no one answered.  Reason for Disposition . [1] Thigh, calf, or ankle swelling AND [2] only 1 side  Answer Assessment - Initial Assessment Questions 1. ONSET: "When did the swelling start?" (e.g., minutes, hours, days)     overnight 2. LOCATION: "What part of the leg is swollen?"  "Are both legs swollen or just one leg?"     Left leg at medial ankle  Top of left foot is swollen hot above ankle hurts with standing 3. SEVERITY: "How bad is the swelling?" (e.g., localized; mild, moderate, severe)  - Localized - small area of swelling localized to one leg  - MILD pedal edema - swelling limited to foot and ankle, pitting edema < 1/4 inch (6 mm) deep, rest and elevation eliminate most or all swelling  - MODERATE edema - swelling of lower leg to knee, pitting edema > 1/4 inch (6 mm) deep, rest and elevation only partially reduce swelling  - SEVERE edema - swelling extends above knee, facial or hand swelling present      mild 4. REDNESS: "Does the swelling look red or infected?"     More ly red on affected 5. PAIN: "Is the swelling painful to touch?" If so, ask: "How painful is it?"   (Scale 1-10; mild, moderate or severe)     Yes-6/10 worse with standing / cannot bear weight on it 6. FEVER: "Do you have a fever?" If so, ask: "What is it, how was it measured, and when did it start?"      No used a laser temp probe and skin temp was 100.4 7. CAUSE: "What do you think is causing the leg swelling?"     Worried r/t constant runny nose - pt does not  know 8. MEDICAL HISTORY: "Do you have a history of heart failure, kidney disease, liver failure, or cancer?"     no 9. RECURRENT SYMPTOM: "Have you had leg swelling before?" If so, ask: "When was the last time?" "What happened that time?"     Yes wears compression hose- but swelling is usually not like this h/o lymphedema to legs 10. OTHER SYMPTOMS: "Do you have any other symptoms?" (e.g., chest pain, difficulty breathing)       Runny nose when stand up or bend over. 11. PREGNANCY: "Is there any chance you are pregnant?" "When was your last menstrual period?"       n/a  Protocols used: LEG SWELLING AND EDEMA-A-AH

## 2019-04-12 NOTE — ED Triage Notes (Signed)
Pt c/o left foot pain x 2 days w/o injury

## 2019-04-12 NOTE — ED Provider Notes (Addendum)
[facility] EMERGENCY DEPARTMENT Provider Note   CSN: [id number] Arrival date & time: 04/12/19  1634    History   Chief Complaint Chief Complaint  Patient presents with  . Foot Pain    HPI Tracey Page is a 63 y.o. female with history of thyroid disease, obesity, HTN, HLD, diabetes on Trul[place], chronic leg edema is here for evaluation of gradual, worsening, left lower leg pain associated with swelling, redness and warmth.  Onset last night.  She called her PCP and was advised to come to the ER for rule out DVT.  She denies any trauma.  No fever, chills.  No chest pain, shortness of breath.  No history of DVT or PE.  No distal loss of sensation, paresthesias.  Pain is worse with ambulation, palpation.  She has chronic lower extremity edema and wears compression stockings.  No recent surgery, prolonged immobilization, hormonal therapy, history of cancer.  No interventions.      HPI  Past Medical History:  Diagnosis Date  . Hyperlipidemia   . Hypertension   . Thyroid disease     Patient Active Problem List   Diagnosis Date Noted  . Blood in stool   . Polyp of cecum   . Diverticulosis of colon without hemorrhage   . Hemorrhoids   . Wound of right leg 07/25/2017  . Cellulitis of right lower extremity 07/10/2017  . Cellulitis of right lower leg 08/15/2015  . Severe obesity (BMI >= 40) ([place]) 10/21/2013  . Hyperlipidemia LDL goal <100 06/27/2010  . CARDIAC MURMUR 06/27/2010  . OTITIS MEDIA, SEROUS, RIGHT 06/08/2010  . DYSURIA 06/16/2009  . EDEMA 08/22/2008  . ECTOPIC PREGNANCY 04/26/2008  . MORBID OBESITY 12/10/2007  . Essential hypertension 11/17/2007  . CHEST PAIN UNSPECIFIED 11/17/2007  . Hypothyroidism 04/21/2007    Past Surgical History:  Procedure Laterality Date  . COLONOSCOPY WITH PROPOFOL N/A 12/17/2018   Procedure: COLONOSCOPY WITH PROPOFOL;  Surgeon: Milus Banister, MD;  Location: [facility] ENDOSCOPY;  Service: Endoscopy;  Laterality: N/A;  . ECTOPIC  PREGNANCY SURGERY    . POLYPECTOMY  12/17/2018   Procedure: POLYPECTOMY;  Surgeon: Milus Banister, MD;  Location: [facility];  Service: Endoscopy;;     OB History   No obstetric history on file.      Home Medications    Prior to Admission medications   Medication Sig Start Date End Date Taking? Authorizing Provider  amLODipine (NORVASC) 10 MG tablet Take 1 tablet (10 mg total) by mouth daily. Patient taking differently: Take 10 mg by mouth every evening.  09/15/18  Yes Roma Schanz R, DO  atorvastatin (LIPITOR) 20 MG tablet TAKE 1 TABLET BY MOUTH ONCE DAILY Patient taking differently: Take 20 mg by mouth every evening.  11/23/18  Yes Roma Schanz R, DO  azelastine (ASTELIN) 0.1 % nasal spray Place 1 spray into both nostrils 2 (two) times daily. Use in each nostril as directed 03/05/19  Yes Lowne Chase, Yvonne R, DO  furosemide (LASIX) 20 MG tablet TAKE 1 TO 2 TABLETS BY MOUTH ONCE DAILY FOR EDEMA . APPOINTMENT REQUIRED FOR FUTURE REFILLS 04/06/19  Yes Roma Schanz R, DO  levocetirizine (XYZAL) 5 MG tablet Take 1 tablet (5 mg total) by mouth every evening. 02/12/19  Yes Roma Schanz R, DO  metoprolol succinate (TOPROL-XL) 50 MG 24 hr tablet TAKE 3 TABLETS BY MOUTH ONCE DAILY IMMEDIATELY  FOLLOWING  A  MEAL 03/29/19  Yes Roma Schanz R, DO  pantoprazole (  PROTONIX) 20 MG tablet Take 1 tablet (20 mg total) by mouth daily. 01/21/19  Yes Lowne Chase, Yvonne R, DO  SYNTHROID 137 MCG tablet Take 1 tablet by mouth every morning. 01/01/19  Yes [provider]  ALPRAZolam (XANAX) 0.25 MG tablet Take 1 tablet (0.25 mg total) by mouth 2 (two) times daily as needed for anxiety. Patient taking differently: Take 0.25 mg by mouth daily as needed for anxiety.  01/16/16   Ann Held, DO  doxycycline (VIBRAMYCIN) 100 MG capsule Take 1 capsule (100 mg total) by mouth 2 (two) times daily. 04/12/19   Kinnie Feil, PA-C  EPINEPHrine 0.3 mg/0.3 mL IJ SOAJ  injection INJECT CONTENTS OF 1 PEN AS NEEDED FOR ALLERGIC REACTION 01/01/19   [provider]  fluticasone (FLONASE) 50 MCG/ACT nasal spray Place 2 sprays into both nostrils daily. 02/12/19   Roma Schanz R, DO  NONFORMULARY OR COMPOUNDED ITEM Compression socks  30-40 mmhg  #1  As directed 03/05/19   Carollee Herter, Kendrick Fries R, DO  TRULICITY 1.5 AY/3.0ZS SOPN Inject 1.5 mg into the skin once a week. 01/01/19   [provider]    Family History Family History  Problem Relation Age of Onset  . Arthritis Mother   . Aneurysm Father   . Hyperlipidemia Other   . Hypertension Other   . Diabetes Maternal Grandfather   . Coronary artery disease Other     Social History Social History   Tobacco Use  . Smoking status: Former Research scientist (life sciences)  . Smokeless tobacco: Never Used  Substance Use Topics  . Alcohol use: Yes    Comment: socially, not even on weekly basis  . Drug use: No     Allergies   Diovan [valsartan], Iodine, Lisinopril, Shellfish allergy, and Sulfonamide derivatives   Review of Systems Review of Systems  Cardiovascular: Positive for leg swelling.  Musculoskeletal: Positive for myalgias.  Skin: Positive for color change.  All other systems reviewed and are negative.    Physical Exam Updated Vital Signs BP (!) 148/85 (BP Location: Right Arm)   Pulse 90   Temp 99.6 F (37.6 C) (Oral)   Resp 20   Ht 5\' 5"  (1.651 m)   Wt 122.5 kg   SpO2 100%   BMI 44.93 kg/m   Physical Exam Vitals signs and nursing note reviewed.  Constitutional:      Appearance: She is well-developed.     Comments: NAD.  HENT:     Head: Normocephalic and atraumatic.     Right Ear: External ear normal.     Left Ear: External ear normal.     Nose: Nose normal.  Eyes:     Conjunctiva/sclera: Conjunctivae normal.  Neck:     Musculoskeletal: Normal range of motion and neck supple.  Cardiovascular:     Rate and Rhythm: Normal rate and regular rhythm.     Heart sounds: Normal heart  sounds.     Comments: 1+ DP pulses bilaterally.  Toes are well perfused. Pulmonary:     Effort: Pulmonary effort is normal.     Breath sounds: Normal breath sounds.  Musculoskeletal: Normal range of motion.     Left lower leg: Edema present.     Comments: Beefy red erythema, tenderness and mild asymmetric edema to the left lower extremity beginning at the medial ankle and halfway through the tib/fib.  There is erythema to the front of the leg, none circumferential.  Mild distal posterior calf tenderness.  Full range of motion  of the left knee and ankle without significant pain.  Skin:    General: Skin is warm and dry.     Capillary Refill: Capillary refill takes less than 2 seconds.  Neurological:     Mental Status: She is alert and oriented to person, place, and time.     Comments: Sensation to light touch and strength intact in lower extremities bilaterally  Psychiatric:        Behavior: Behavior normal.        Thought Content: Thought content normal.        Judgment: Judgment normal.      ED Treatments / Results  Labs (all labs ordered are listed, but only abnormal results are displayed) Labs Reviewed - No data to display  EKG None  Radiology No results found.  Procedures Procedures (including critical care time)  Medications Ordered in ED Medications  enoxaparin (LOVENOX) injection 40 mg (40 mg Subcutaneous Given 04/12/19 1717)     Initial Impression / Assessment and Plan / ED Course  I have reviewed the triage vital signs and the nursing notes.  Pertinent labs & imaging results that were available during my care of the patient were reviewed by me and considered in my medical decision making (see chart for details).  ddx includes cellulitis vs DVT.   DVT US not available at this time in the ER. She was given lovenox IM and discharged with DVT US ordered for tomorrow. My highest clinical suspicion is for cellulitis given appearance of leg and most tenderness to  anterior leg, so will dc with antibiotics which she can dc if DVT found on [place]. She has no fever, VS WNL and I don't think emergent labs indicated at this time. I doubt systemic infection. She has no CP, SOB, hypoxemia, tachycardia and PE unlikely. Extremity is NVI. No signs of phlegmasia, ischemic limb.  Dc with elevated, NSAID, doxy. Return tomorrow for DVT. Discussed plan with patient who is comfortable with this. Return precautions given. Redness marked by me to trend response to antibiotics.   Final Clinical Impressions(s) / ED Diagnoses   Final diagnoses:  Redness and swelling of lower leg    ED Discharge Orders         Ordered    doxycycline (VIBRAMYCIN) 100 MG capsule  2 times daily     04/12/19 1724    VAS [place] LOWER EXTREMITY VENOUS (DVT)  Status:  Canceled     04/12/19 1726    US Venous Img Lower Unilateral Left     04/12/19 1742           Arlean Hopping 04/12/19 1813    Davonna Belling, MD 04/12/19 [phone]

## 2019-04-12 NOTE — Discharge Instructions (Addendum)
You were seen in the ER for left leg swelling, redness and pain.  We are concerned about cellulitis or possibly deep vein thrombus (clot).  Unfortunately we did not have ultrasound to evaluate for a blood clot.  We gave you a shot of a blood thinner which is a treatment for blood clot for the next 12 hours.  The redness in your leg was marked to track the spread of redness   I have ordered on ultrasound for you to do tomorrow morning.  See instructions.   It is possible that this is a skin infection called cellulitis.  I have given you a prescription for antibiotics to take.  If you are found to have a blood clot, you may discontinue these antibiotics.  Keep your leg elevated.  You can alternate ibuprofen and acetaminophen every 6-8 hours for pain.  Return to the ER if there is any fever greater than 100, sudden worsening of the redness or pain, loss of sensation to your foot or coolness, chest pain, shortness of breath

## 2019-04-12 NOTE — ED Notes (Signed)
ED Provider at bedside. 

## 2019-04-13 ENCOUNTER — Ambulatory Visit (HOSPITAL_BASED_OUTPATIENT_CLINIC_OR_DEPARTMENT_OTHER)
Admission: RE | Admit: 2019-04-13 | Discharge: 2019-04-13 | Disposition: A | Discharge status: Home / Self Care | Payer: 59 | Source: Ambulatory Visit | Attending: Emergency Medicine | Admitting: Emergency Medicine

## 2019-04-13 ENCOUNTER — Ambulatory Visit (HOSPITAL_BASED_OUTPATIENT_CLINIC_OR_DEPARTMENT_OTHER): Payer: 59

## 2019-04-13 DIAGNOSIS — M79605 Pain in left leg: Secondary | ICD-10-CM | POA: Insufficient documentation

## 2019-04-13 DIAGNOSIS — R600 Localized edema: Secondary | ICD-10-CM | POA: Diagnosis not present

## 2019-04-21 ENCOUNTER — Other Ambulatory Visit: Payer: Self-pay | Admitting: Family Medicine

## 2019-04-21 DIAGNOSIS — F418 Other specified anxiety disorders: Secondary | ICD-10-CM

## 2019-04-22 NOTE — Telephone Encounter (Signed)
Requesting: Xanax Contract: 06/04/2018 UDS: 06/04/2018, low risk, 06/05/2019 Last OV: 03/05/2019 Next OV: N/A Last Refill: 01/16/2016, #[id number] RF Database:   Please advise

## 2019-06-02 ENCOUNTER — Ambulatory Visit (INDEPENDENT_AMBULATORY_CARE_PROVIDER_SITE_OTHER): Payer: 59 | Admitting: Family Medicine

## 2019-06-02 ENCOUNTER — Other Ambulatory Visit: Payer: Self-pay

## 2019-06-02 ENCOUNTER — Encounter (INDEPENDENT_AMBULATORY_CARE_PROVIDER_SITE_OTHER): Payer: Self-pay | Admitting: Family Medicine

## 2019-06-02 VITALS — BP 134/71 | HR 65 | Temp 98.6°F | Ht 65.0 in | Wt 308.0 lb

## 2019-06-02 DIAGNOSIS — Z1331 Encounter for screening for depression: Secondary | ICD-10-CM | POA: Diagnosis not present

## 2019-06-02 DIAGNOSIS — R0602 Shortness of breath: Secondary | ICD-10-CM | POA: Diagnosis not present

## 2019-06-02 DIAGNOSIS — I10 Essential (primary) hypertension: Secondary | ICD-10-CM | POA: Diagnosis not present

## 2019-06-02 DIAGNOSIS — Z0289 Encounter for other administrative examinations: Secondary | ICD-10-CM

## 2019-06-02 DIAGNOSIS — E1165 Type 2 diabetes mellitus with hyperglycemia: Secondary | ICD-10-CM

## 2019-06-02 DIAGNOSIS — Z6841 Body Mass Index (BMI) 40.0 and over, adult: Secondary | ICD-10-CM

## 2019-06-02 DIAGNOSIS — R5383 Other fatigue: Secondary | ICD-10-CM

## 2019-06-02 DIAGNOSIS — Z9189 Other specified personal risk factors, not elsewhere classified: Secondary | ICD-10-CM | POA: Diagnosis not present

## 2019-06-03 LAB — COMPREHENSIVE METABOLIC PANEL
ALT: 26 IU/L (ref 0–32)
AST: 20 IU/L (ref 0–40)
Albumin/Globulin Ratio: 1.4 (ref 1.2–2.2)
Albumin: 4.2 g/dL (ref 3.8–4.8)
Alkaline Phosphatase: 103 IU/L (ref 39–117)
BUN/Creatinine Ratio: 16 (ref 12–28)
BUN: 13 mg/dL (ref 8–27)
Bilirubin Total: 0.4 mg/dL (ref 0.0–1.2)
CO2: 24 mmol/L (ref 20–29)
Calcium: 9.3 mg/dL (ref 8.7–10.3)
Chloride: 103 mmol/L (ref 96–106)
Creatinine, Ser: 0.8 mg/dL (ref 0.57–1.00)
GFR calc Af Amer: 91 mL/min/{1.73_m2} (ref >59)
GFR calc non Af Amer: 79 mL/min/{1.73_m2} (ref >59)
Globulin, Total: 3.1 g/dL (ref 1.5–4.5)
Glucose: 101 mg/dL — ABNORMAL HIGH (ref 65–99)
Potassium: 4.1 mmol/L (ref 3.5–5.2)
Sodium: 142 mmol/L (ref 134–144)
Total Protein: 7.3 g/dL (ref 6.0–8.5)

## 2019-06-03 LAB — CBC WITH DIFFERENTIAL/PLATELET
Basophils Absolute: 0 10*3/uL (ref 0.0–0.2)
Basos: 1 %
EOS (ABSOLUTE): 0.1 10*3/uL (ref 0.0–0.4)
Eos: 3 %
Hematocrit: 42.9 % (ref 34.0–46.6)
Hemoglobin: 14.2 g/dL (ref 11.1–15.9)
Immature Grans (Abs): 0 10*3/uL (ref 0.0–0.1)
Immature Granulocytes: 0 %
Lymphocytes Absolute: 1.5 10*3/uL (ref 0.7–3.1)
Lymphs: 37 %
MCH: 30.1 pg (ref 26.6–33.0)
MCHC: 33.1 g/dL (ref 31.5–35.7)
MCV: 91 fL (ref 79–97)
Monocytes Absolute: 0.4 10*3/uL (ref 0.1–0.9)
Monocytes: 9 %
Neutrophils Absolute: 2.1 10*3/uL (ref 1.4–7.0)
Neutrophils: 50 %
Platelets: 217 10*3/uL (ref 150–450)
RBC: 4.72 x10E6/uL (ref 3.77–5.28)
RDW: 14.5 % (ref 11.7–15.4)
WBC: 4 10*3/uL (ref 3.4–10.8)

## 2019-06-03 LAB — LIPID PANEL
Chol/HDL Ratio: 3.6 ratio (ref 0.0–4.4)
Cholesterol, Total: 145 mg/dL (ref 100–199)
HDL: 40 mg/dL (ref >39)
LDL Calculated: 90 mg/dL (ref 0–99)
Triglycerides: 77 mg/dL (ref 0–149)
VLDL Cholesterol Cal: 15 mg/dL (ref 5–40)

## 2019-06-03 LAB — VITAMIN B12: Vitamin B-12: 277 pg/mL (ref 232–1245)

## 2019-06-03 LAB — VITAMIN D 25 HYDROXY (VIT D DEFICIENCY, FRACTURES): Vit D, 25-Hydroxy: 6.3 ng/mL — ABNORMAL LOW (ref 30.0–100.0)

## 2019-06-03 LAB — FOLATE: Folate: 3.6 ng/mL (ref >3.0)

## 2019-06-03 LAB — T3: T3, Total: 127 ng/dL (ref 71–180)

## 2019-06-03 LAB — INSULIN, RANDOM: INSULIN: 22 u[IU]/mL (ref 2.6–24.9)

## 2019-06-03 LAB — T4, FREE: Free T4: 1.45 ng/dL (ref 0.82–1.77)

## 2019-06-03 LAB — TSH: TSH: 0.5 u[IU]/mL (ref 0.450–4.500)

## 2019-06-03 NOTE — Progress Notes (Signed)
Office: [phone]  /  Fax: [phone]   Dear Dr. Carollee Herter,   Thank you for referring Tracey Page to our clinic. The following note includes my evaluation and treatment recommendations.  HPI:   Chief Complaint: OBESITY    Tracey Page has been referred by Ann Held, DO for consultation regarding her obesity and obesity related comorbidities.    Aala VALEEN BORYS (MR# [id number]) is a 63 y.o. female who presents on 06/02/2019 for obesity evaluation and treatment. Current BMI is Body mass index is 51.25 kg/m. Tracey Page has been struggling with her weight for many years and has been unsuccessful in either losing weight, maintaining weight loss, or reaching her healthy weight goal.     Tracey Page skips breakfast, but has coffee with hazelnut cream (2 tbsp), and splenda. She denies being hungry. She eats around 2 pm, 1 slice of cheese, [place] pastrami, or [place] hotdog with bun, and with or without chips (out of a big bag) feels full. Around 7 pm she is doing vegetables (1.5 cup) and meat (1 thigh).     Tracey Page attended our information session and states she is currently in the action stage of change and ready to dedicate time achieving and maintaining a healthier weight. Tracey Page is interested in becoming our patient and working on intensive lifestyle modifications including (but not limited to) diet, exercise and weight loss.    Tracey Page states she thinks her family will eat healthier with  her her desired weight loss is 108 lbs she started gaining weight after thyroid diagnosis her heaviest weight ever was 304 lbs she is a picky eater and doesn't like to eat healthier foods  she has significant food cravings issues  she skips meals frequently she is frequently drinking liquids with calories she struggles with emotional eating    Fatigue Tracey Page feels her energy is lower than it should be. This has worsened with weight gain and has not worsened recently. Tracey Page admits to daytime  somnolence and  admits to waking up still tired. Patient is at risk for obstructive sleep apnea. Patent has a history of symptoms of daytime fatigue. Patient generally gets 6 hours of sleep per night, and states they generally have generally restful sleep. Snoring is not present. Apneic episodes are not present. Epworth Sleepiness Score is 4.  Dyspnea on exertion Tracey Page notes increasing shortness of breath with exercising and seems to be worsening over time with weight gain. She notes getting out of breath sooner with activity than she used to. This has not gotten worse recently. EKG-normal sinus rhythm at 68 BPM. Tracey Page denies orthopnea.  Hypertension Tracey Page is a 63 y.o. female with hypertension. Tracey Page has had diagnosis for 10 years. She denies chest pain, chest pressure, or headaches. She is working on weight loss to help control her blood pressure with the goal of decreasing her risk of heart attack and stroke.   Diabetes II with Hyperglycemia Tracey Page has a new diagnosis of diabetes type II since the past 3 months. Tracey Page is on Trulicity 1.5 mg, and Hgb A1c of 6.7.  She has been working on intensive lifestyle modifications including diet, exercise, and weight loss to help control her blood glucose levels.  At risk for cardiovascular disease Tracey Page is at a higher than average risk for cardiovascular disease due to obesity, hypertension, and diabetes II. She currently denies any chest pain.  Depression Screen Tracey Page's Food and Mood (modified PHQ-9) score was  Depression screen [facility] 2/9 06/02/2019  Decreased Interest  3  Down, Depressed, Hopeless 1  PHQ - 2 Score 4  Altered sleeping 1  Tired, decreased energy 2  Change in appetite 1  Feeling bad or failure about yourself  0  Trouble concentrating 1  Moving slowly or fidgety/restless 0  Suicidal thoughts 0  PHQ-9 Score 9  Difficult doing work/chores Not difficult at all    ASSESSMENT AND PLAN:  Other fatigue - Plan: EKG 12-Lead,  Comprehensive metabolic panel, CBC with Differential/Platelet, VITAMIN D 25 Hydroxy (Vit-D Deficiency, Fractures), Vitamin B12, Folate, T4, free, T3, TSH  SOB (shortness of breath) on exertion - Plan: Lipid panel  Essential hypertension - Plan: Comprehensive metabolic panel  Type 2 diabetes mellitus with hyperglycemia, without long-term current use of insulin (HCC) - Plan: Comprehensive metabolic panel, Insulin, random  Depression screening  At risk for heart disease  Class 3 severe obesity with serious comorbidity and body mass index (BMI) of 50.0 to 59.9 in adult, unspecified obesity type (HCC)  PLAN:  Fatigue Tracey Page was informed that her fatigue may be related to obesity, depression or many other causes. Labs will be ordered, and in the meanwhile Tracey Page has agreed to work on diet, exercise and weight loss to help with fatigue. Proper sleep hygiene was discussed including the need for 7-8 hours of quality sleep each night. A sleep study was not ordered based on symptoms and Epworth score.  Dyspnea on exertion Tracey Page's shortness of breath appears to be obesity related and exercise induced. She has agreed to work on weight loss and gradually increase exercise to treat her exercise induced shortness of breath. If Tracey Page follows our instructions and loses weight without improvement of her shortness of breath, we will plan to refer to pulmonology. We will monitor this condition regularly. Tracey Page agrees to this plan.  Hypertension We discussed sodium restriction, working on healthy weight loss, and a regular exercise program as the means to achieve improved blood pressure control. Tracey Page agreed with this plan and agreed to follow up as directed. We will continue to monitor her blood pressure as well as her progress with the above lifestyle modifications. Tracey Page will continue her medications as prescribed and will watch for signs of hypotension as she continues her lifestyle modifications. We will check  CMP and FLP today. Tracey Page agrees to follow up with our clinic in 2 weeks.  Diabetes II with Hyperglycemia Tracey Page has been given extensive diabetes education by myself today including ideal fasting and post-prandial blood glucose readings, individual ideal Hgb A1c goals and hypoglycemia prevention. We discussed the importance of good blood sugar control to decrease the likelihood of diabetic complications such as nephropathy, neuropathy, limb loss, blindness, coronary artery disease, and death. We discussed the importance of intensive lifestyle modification including diet, exercise and weight loss as the first line treatment for diabetes. Le agrees to continue her diabetes medications, and we will check insulin level today; and send blood glucose monitoring kit to the pharmacy. Trella agrees to follow up with our clinic in 2 weeks.  Cardiovascular risk counseling Rima was given extended (15 minutes) coronary artery disease prevention counseling today. She is 63 y.o. female and has risk factors for heart disease including obesity, hypertension, and diabetes II. We discussed intensive lifestyle modifications today with an emphasis on specific weight loss instructions and strategies. Pt was also informed of the importance of increasing exercise and decreasing saturated fats to help prevent heart disease.  Depression Screen Trenisha had a mildly positive depression screening. Depression is commonly associated with obesity  and often results in emotional eating behaviors. We will monitor this closely and work on CBT to help improve the non-hunger eating patterns. Referral to Psychology may be required if no improvement is seen as she continues in our clinic.  Obesity Ikhlas is currently in the action stage of change and her goal is to continue with weight loss efforts. I recommend Rebeca begin the structured treatment plan as follows:  She has agreed to follow our Pescatarian eating plan + 100 calories Talena has  been instructed to eventually work up to a goal of 150 minutes of combined cardio and strengthening exercise per week for weight loss and overall health benefits. We discussed the following Behavioral Modification Strategies today: increasing lean protein intake, increasing vegetables and work on meal planning and easy cooking plans, and planning for success   She was informed of the importance of frequent follow up visits to maximize her success with intensive lifestyle modifications for her multiple health conditions. She was informed we would discuss her lab results at her next visit unless there is a critical issue that needs to be addressed sooner. Trinna agreed to keep her next visit at the agreed upon time to discuss these results.  ALLERGIES: Allergies  Allergen Reactions   Diovan [Valsartan] Swelling and Other (See Comments)    angioedema   Iodine Swelling   Lisinopril Swelling   Shellfish Allergy Swelling    Swelling of tongue   Sulfonamide Derivatives Swelling    Swelling in hand/knees    MEDICATIONS: Current Outpatient Medications on File Prior to Visit  Medication Sig Dispense Refill   amLODipine (NORVASC) 10 MG tablet Take 1 tablet (10 mg total) by mouth daily. (Patient taking differently: Take 10 mg by mouth every evening. ) 90 tablet 1   atorvastatin (LIPITOR) 20 MG tablet TAKE 1 TABLET BY MOUTH ONCE DAILY (Patient taking differently: Take 20 mg by mouth every evening. ) 90 tablet 1   azelastine (ASTELIN) 0.1 % nasal spray Place 1 spray into both nostrils 2 (two) times daily. Use in each nostril as directed 30 mL 12   EPINEPHrine 0.3 mg/0.3 mL IJ SOAJ injection INJECT CONTENTS OF 1 PEN AS NEEDED FOR ALLERGIC REACTION     fluticasone (FLONASE) 50 MCG/ACT nasal spray Place 2 sprays into both nostrils daily. 16 g 6   furosemide (LASIX) 20 MG tablet TAKE 1 TO 2 TABLETS BY MOUTH ONCE DAILY FOR EDEMA . APPOINTMENT REQUIRED FOR FUTURE REFILLS 60 tablet 2    levocetirizine (XYZAL) 5 MG tablet Take 1 tablet (5 mg total) by mouth every evening. 30 tablet 5   metoprolol succinate (TOPROL-XL) 50 MG 24 hr tablet TAKE 3 TABLETS BY MOUTH ONCE DAILY IMMEDIATELY  FOLLOWING  A  MEAL 270 tablet 1   NONFORMULARY OR COMPOUNDED ITEM Compression socks  30-40 mmhg  #1  As directed 1 each 0   pantoprazole (PROTONIX) 20 MG tablet Take 1 tablet (20 mg total) by mouth daily. 90 tablet 1   SYNTHROID 137 MCG tablet Take 1 tablet by mouth every morning.     TRULICITY 1.5 JL/8.7GB SOPN Inject 1.5 mg into the skin once a week.     ALPRAZolam (XANAX) 0.25 MG tablet TAKE ONE TABLET BY MOUTH TWICE DAILY AS NEEDED FOR ANXIETY (Patient not taking: Reported on 06/02/2019) 45 tablet 0   doxycycline (VIBRAMYCIN) 100 MG capsule Take 1 capsule (100 mg total) by mouth 2 (two) times daily. (Patient not taking: Reported on 06/02/2019) 20 capsule 0  No current facility-administered medications on file prior to visit.     PAST MEDICAL HISTORY: Past Medical History:  Diagnosis Date   Anxiety    Bilateral swelling of feet and ankles    Diverticulitis    Food allergy    Hyperlipidemia    Hypertension    Hypothyroidism    Lymphedema    Obesity    Pre-diabetes    Thyroid disease     PAST SURGICAL HISTORY: Past Surgical History:  Procedure Laterality Date   COLONOSCOPY WITH PROPOFOL N/A 12/17/2018   Procedure: COLONOSCOPY WITH PROPOFOL;  Surgeon: Milus Banister, MD;  Location: [facility];  Service: Endoscopy;  Laterality: N/A;   ECTOPIC PREGNANCY SURGERY     POLYPECTOMY  12/17/2018   Procedure: POLYPECTOMY;  Surgeon: Milus Banister, MD;  Location: [facility];  Service: Endoscopy;;    SOCIAL HISTORY: Social History   Tobacco Use   Smoking status: Former Smoker   Smokeless tobacco: Never Used  Substance Use Topics   Alcohol use: Yes    Comment: socially, not even on weekly basis   Drug use: No    FAMILY HISTORY: Family History  Problem  Relation Age of Onset   Arthritis Mother    Hypertension Mother    Heart disease Mother    Anxiety disorder Mother    Thyroid disease Mother    Obesity Mother    Aneurysm Father    Sudden death Father    Hyperlipidemia Other    Hypertension Other    Diabetes Maternal Grandfather    Coronary artery disease Other     ROS: Review of Systems  Constitutional: Positive for malaise/fatigue. Negative for weight loss.       + Trouble sleeping  HENT:       + Nasal discharge  Eyes:       + Vision changes  Respiratory: Positive for shortness of breath (with exertion).   Cardiovascular: Negative for chest pain and orthopnea.       Negative chest pressure  Genitourinary: Positive for frequency.  Skin:       + Hair or nail changes  Neurological: Negative for headaches.  Endo/Heme/Allergies:       Negative hypoglycemia    PHYSICAL EXAM: Blood pressure 134/71, pulse 65, temperature 98.6 F (37 C), temperature source Oral, height '5\' 5"'  (1.651 m), weight (!) 308 lb (139.7 kg), SpO2 100 %. Body mass index is 51.25 kg/m. Physical Exam Vitals signs reviewed.  Constitutional:      Appearance: Normal appearance. She is obese.  HENT:     Head: Normocephalic and atraumatic.     Nose: Nose normal.  Eyes:     General: No scleral icterus.    Extraocular Movements: Extraocular movements intact.  Neck:     Musculoskeletal: Normal range of motion and neck supple.     Comments: No thyromegaly present Cardiovascular:     Rate and Rhythm: Normal rate and regular rhythm.     Pulses: Normal pulses.     Heart sounds: Normal heart sounds.  Pulmonary:     Effort: Pulmonary effort is normal. No respiratory distress.     Breath sounds: Normal breath sounds.  Abdominal:     Palpations: Abdomen is soft.     Tenderness: There is no abdominal tenderness.     Comments: + Obesity  Musculoskeletal: Normal range of motion.     Right lower leg: No edema.     Left lower leg: No edema.    Skin:  General: Skin is warm and dry.  Neurological:     Mental Status: She is alert and oriented to person, place, and time.     Coordination: Coordination normal.  Psychiatric:        Mood and Affect: Mood normal.        Behavior: Behavior normal.     RECENT LABS AND TESTS: BMET    Component Value Date/Time   NA 142 06/02/2019 1356   K 4.1 06/02/2019 1356   CL 103 06/02/2019 1356   CO2 24 06/02/2019 1356   GLUCOSE 101 (H) 06/02/2019 1356   GLUCOSE 99 06/04/2018 0958   BUN 13 06/02/2019 1356   CREATININE 0.80 06/02/2019 1356   CALCIUM 9.3 06/02/2019 1356   GFRNONAA 79 06/02/2019 1356   GFRAA 91 06/02/2019 1356   Lab Results  Component Value Date   HGBA1C 6.4 07/25/2017   Lab Results  Component Value Date   INSULIN 22.0 06/02/2019   CBC    Component Value Date/Time   WBC 4.0 06/02/2019 1356   WBC 6.7 09/09/2016 1502   RBC 4.72 06/02/2019 1356   RBC 4.64 09/09/2016 1502   HGB 14.2 06/02/2019 1356   HCT 42.9 06/02/2019 1356   PLT 217 06/02/2019 1356   MCV 91 06/02/2019 1356   MCH 30.1 06/02/2019 1356   MCHC 33.1 06/02/2019 1356   MCHC 33.4 09/09/2016 1502   RDW 14.5 06/02/2019 1356   LYMPHSABS 1.5 06/02/2019 1356   MONOABS 0.3 09/09/2016 1502   EOSABS 0.1 06/02/2019 1356   BASOSABS 0.0 06/02/2019 1356   Iron/TIBC/Ferritin/ %Sat No results found for: IRON, TIBC, FERRITIN, IRONPCTSAT Lipid Panel     Component Value Date/Time   CHOL 145 06/02/2019 1356   TRIG 77 06/02/2019 1356   HDL 40 06/02/2019 1356   CHOLHDL 3.6 06/02/2019 1356   CHOLHDL 3 06/04/2018 0958   VLDL 13.2 06/04/2018 0958   LDLCALC 90 06/02/2019 1356   LDLDIRECT 179.1 09/16/2012 0925   Hepatic Function Panel     Component Value Date/Time   PROT 7.3 06/02/2019 1356   ALBUMIN 4.2 06/02/2019 1356   AST 20 06/02/2019 1356   ALT 26 06/02/2019 1356   ALKPHOS 103 06/02/2019 1356   BILITOT 0.4 06/02/2019 1356   BILIDIR 0.2 04/13/2015 1357      Component Value Date/Time   TSH  0.500 06/02/2019 1356   TSH 1.42 06/04/2018 0958   TSH 1.186 10/10/2015 1523    ECG  shows NSR with a rate of 68 BPM INDIRECT CALORIMETER done today shows a VO2 of 308 and a REE of 1575.  Her calculated basal metabolic rate is 7034 thus her basal metabolic rate is worse than expected.       OBESITY BEHAVIORAL INTERVENTION VISIT  Today's visit was # 1   Starting weight: 308 lbs Starting date: 06/02/2019 Today's weight : 308 lbs  Today's date: 06/02/2019 Total lbs lost to date: 0    ASK: We discussed the diagnosis of obesity with [name] today and Rheanne agreed to give [place] permission to discuss obesity behavioral modification therapy today.  ASSESS: Carrie has the diagnosis of obesity and her BMI today is 51.25 Kandance is in the action stage of change   ADVISE: Phelicia was educated on the multiple health risks of obesity as well as the benefit of weight loss to improve her health. She was advised of the need for long term treatment and the importance of lifestyle modifications to improve her current health and  to decrease her risk of future health problems.  AGREE: Multiple dietary modification options and treatment options were discussed and  Reilynn agreed to follow the recommendations documented in the above note.  ARRANGE: Aaryana was educated on the importance of frequent visits to treat obesity as outlined per CMS and USPSTF guidelines and agreed to schedule her next follow up appointment today.  I, Trixie Dredge, am acting as transcriptionist for Ilene Qua, MD   I have reviewed the above documentation for accuracy and completeness, and I agree with the above. - Ilene Qua, MD

## 2019-06-06 ENCOUNTER — Other Ambulatory Visit: Payer: Self-pay | Admitting: Family Medicine

## 2019-06-06 DIAGNOSIS — I10 Essential (primary) hypertension: Secondary | ICD-10-CM

## 2019-06-16 ENCOUNTER — Ambulatory Visit (INDEPENDENT_AMBULATORY_CARE_PROVIDER_SITE_OTHER): Payer: Self-pay | Admitting: Family Medicine

## 2019-06-28 ENCOUNTER — Other Ambulatory Visit: Payer: Self-pay

## 2019-06-28 ENCOUNTER — Ambulatory Visit (INDEPENDENT_AMBULATORY_CARE_PROVIDER_SITE_OTHER): Payer: 59 | Admitting: Bariatrics

## 2019-06-28 VITALS — BP 139/81 | HR 75 | Temp 98.0°F | Ht 65.0 in | Wt 307.0 lb

## 2019-06-28 DIAGNOSIS — I10 Essential (primary) hypertension: Secondary | ICD-10-CM | POA: Diagnosis not present

## 2019-06-28 DIAGNOSIS — E559 Vitamin D deficiency, unspecified: Secondary | ICD-10-CM

## 2019-06-28 DIAGNOSIS — Z6841 Body Mass Index (BMI) 40.0 and over, adult: Secondary | ICD-10-CM

## 2019-06-28 DIAGNOSIS — E1165 Type 2 diabetes mellitus with hyperglycemia: Secondary | ICD-10-CM | POA: Diagnosis not present

## 2019-06-28 DIAGNOSIS — Z9189 Other specified personal risk factors, not elsewhere classified: Secondary | ICD-10-CM

## 2019-06-28 MED ORDER — VITAMIN D (ERGOCALCIFEROL) 1.25 MG (50000 UNIT) PO CAPS: 50000.0000 [IU] | ORAL_CAPSULE | ORAL | 0 refills | Status: DC

## 2019-06-30 ENCOUNTER — Encounter (INDEPENDENT_AMBULATORY_CARE_PROVIDER_SITE_OTHER): Payer: Self-pay | Admitting: Bariatrics

## 2019-06-30 NOTE — Progress Notes (Signed)
Office: [phone]  /  Fax: [phone]   HPI:   Chief Complaint: OBESITY Tracey Page is here to discuss her progress with her obesity treatment plan. She is on the Pescatarian eating plan and low carb plan and is following her eating plan approximately 80% of the time. She states she is exercising 0 minutes 0 times per week. Tracey Page is down 1 lb. She states she has missed some meals but reports doing well with her water intake. Her weight is (!) 307 lb (139.3 kg) today and has had a weight loss of 1 pound over a period of 4 weeks since her last visit. She has lost 1 lb since starting treatment with [place].  Vitamin D deficiency Tracey Page has a diagnosis of Vitamin D deficiency. Last Vitamin D 6.3 on 06/02/2019. She is currently taking prescription Vit D and denies nausea, vomiting or muscle weakness.  At risk for osteopenia and osteoporosis Tracey Page is at higher risk of osteopenia and osteoporosis due to Vitamin D deficiency.   Diabetes Mellitus with Hyperglycemia Tracey Page has a diagnosis of diabetes mellitus and is taking Trulicity. Tracey Page does not report checking blood sugars. Last A1c was 6.9 with an insulin of 22.0. She has been working on intensive lifestyle modifications including diet, exercise, and weight loss to help control her blood glucose levels.  Hypertension Tracey Page is a 63 y.o. female with hypertension.  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 She is working weight loss to help control her blood pressure with the goal of decreasing her risk of heart attack and stroke. Tracey Page's blood pressure is well controlled. No lightheadedness.  ASSESSMENT AND PLAN:  Vitamin D deficiency - Plan: Vitamin D, Ergocalciferol, (DRISDOL) 1.25 MG (50000 UT) CAPS capsule  Type 2 diabetes mellitus with hyperglycemia, without long-term current use of insulin (HCC)  Essential hypertension  At risk for osteoporosis  Class 3 severe obesity with serious comorbidity and body  mass index (BMI) of 50.0 to 59.9 in adult, unspecified obesity type ([place])  PLAN:  Vitamin D Deficiency Tracey Page was informed that low Vitamin D levels contributes to fatigue and are associated with obesity, breast, and colon cancer. She agrees to continue to take prescription Vit D @ 50,000 IU every week #4 with 0 refills and will follow-up for routine testing of Vitamin D, at least 2-3 times per year. She was informed of the risk of over-replacement of Vitamin D and agrees to not increase her dose unless she discusses this with [place] first. Tracey Page agrees to follow-up with our clinic in 2 weeks.  At risk for osteopenia and osteoporosis Tracey Page was given extended  (15 minutes) osteoporosis prevention counseling today. Tracey Page is at risk for osteopenia and osteoporosis due to her Vitamin D deficiency. She was encouraged to take her Vitamin D and follow her higher calcium diet and increase strengthening exercise to help strengthen her bones and decrease her risk of osteopenia and osteoporosis.  Diabetes Mellitus with Hyperglycemia Tracey Page has been given extensive diabetes education by myself today including ideal fasting and post-prandial blood glucose readings, individual ideal HgA1c goals  and hypoglycemia prevention. We discussed the importance of good blood sugar control to decrease the likelihood of diabetic complications such as nephropathy, neuropathy, limb loss, blindness, coronary artery disease, and death. We discussed the importance of intensive lifestyle modification including diet, exercise and weight loss as the first line treatment for diabetes. Tracey Page was instructed to decrease carbohydrates and increase protein. She will follow-up with [place] as  directed to monitor her progress.  Hypertension We discussed sodium restriction, working on healthy weight loss, and a regular exercise program as the means to achieve improved blood pressure control. Tracey Page agreed with this plan and agreed to follow up as  directed. We will continue to monitor her blood pressure as well as her progress with the above lifestyle modifications. She will continue her medications as prescribed, decrease sodium, and will watch for signs of hypotension as she continues her lifestyle modifications.  Obesity Tracey Page is currently in the action stage of change. As such, her goal is to continue with weight loss efforts. She has agreed to follow the Pescatarian eating plan + 100 calories with additional lunch options. Tracey Page will work on meal planning, intentional eating, and no skipping meals. Additional Category 1 and 2 breakfast options were given. Tracey Page has been instructed to work up to a goal of 150 minutes of combined cardio and strengthening exercise per week for weight loss and overall health benefits. We discussed the following Behavioral Modification Strategies today: increasing lean protein intake, decreasing simple carbohydrates, increasing vegetables, increase H20 intake, decrease eating out, no skipping meals, work on meal planning and easy cooking plans, and keeping healthy foods in the home.  Tracey Page has agreed to follow-up with our clinic in 2 weeks. She was informed of the importance of frequent follow-up visits to maximize her success with intensive lifestyle modifications for her multiple health conditions.  ALLERGIES: Allergies  Allergen Reactions  . Diovan [Valsartan] Swelling and Other (See Comments)    angioedema  . Iodine Swelling  . Lisinopril Swelling  . Shellfish Allergy Swelling    Swelling of tongue  . Sulfonamide Derivatives Swelling    Swelling in hand/knees    MEDICATIONS: Current Outpatient Medications on File Prior to Visit  Medication Sig Dispense Refill  . ALPRAZolam (XANAX) 0.25 MG tablet TAKE ONE TABLET BY MOUTH TWICE DAILY AS NEEDED FOR ANXIETY (Patient not taking: Reported on 06/02/2019) 45 tablet 0  . amLODipine (NORVASC) 10 MG tablet Take 1 tablet by mouth once daily 90 tablet 1  .  atorvastatin (LIPITOR) 20 MG tablet Take 1 tablet by mouth once daily 90 tablet 1  . azelastine (ASTELIN) 0.1 % nasal spray Place 1 spray into both nostrils 2 (two) times daily. Use in each nostril as directed 30 mL 12  . doxycycline (VIBRAMYCIN) 100 MG capsule Take 1 capsule (100 mg total) by mouth 2 (two) times daily. (Patient not taking: Reported on 06/02/2019) 20 capsule 0  . EPINEPHrine 0.3 mg/0.3 mL IJ SOAJ injection INJECT CONTENTS OF 1 PEN AS NEEDED FOR ALLERGIC REACTION    . fluticasone (FLONASE) 50 MCG/ACT nasal spray Place 2 sprays into both nostrils daily. 16 g 6  . furosemide (LASIX) 20 MG tablet TAKE 1 TO 2 TABLETS BY MOUTH ONCE DAILY FOR EDEMA . APPOINTMENT REQUIRED FOR FUTURE REFILLS 60 tablet 2  . levocetirizine (XYZAL) 5 MG tablet Take 1 tablet (5 mg total) by mouth every evening. 30 tablet 5  . metoprolol succinate (TOPROL-XL) 50 MG 24 hr tablet TAKE 3 TABLETS BY MOUTH ONCE DAILY IMMEDIATELY  FOLLOWING  A  MEAL 270 tablet 1  . NONFORMULARY OR COMPOUNDED ITEM Compression socks  30-40 mmhg  #1  As directed 1 each 0  . pantoprazole (PROTONIX) 20 MG tablet Take 1 tablet (20 mg total) by mouth daily. 90 tablet 1  . SYNTHROID 137 MCG tablet Take 1 tablet by mouth every morning.    . TRULICITY 1.5 0000000  SOPN Inject 1.5 mg into the skin once a week.     No current facility-administered medications on file prior to visit.     PAST MEDICAL HISTORY: Past Medical History:  Diagnosis Date  . Anxiety   . Bilateral swelling of feet and ankles   . Diverticulitis   . Food allergy   . Hyperlipidemia   . Hypertension   . Hypothyroidism   . Lymphedema   . Obesity   . Pre-diabetes   . Thyroid disease     PAST SURGICAL HISTORY: Past Surgical History:  Procedure Laterality Date  . COLONOSCOPY WITH PROPOFOL N/A 12/17/2018   Procedure: COLONOSCOPY WITH PROPOFOL;  Surgeon: Milus Banister, MD;  Location: [facility];  Service: Endoscopy;  Laterality: N/A;  . ECTOPIC PREGNANCY SURGERY     . POLYPECTOMY  12/17/2018   Procedure: POLYPECTOMY;  Surgeon: Milus Banister, MD;  Location: [facility];  Service: Endoscopy;;    SOCIAL HISTORY: Social History   Tobacco Use  . Smoking status: Former Research scientist (life sciences)  . Smokeless tobacco: Never Used  Substance Use Topics  . Alcohol use: Yes    Comment: socially, not even on weekly basis  . Drug use: No    FAMILY HISTORY: Family History  Problem Relation Age of Onset  . Arthritis Mother   . Hypertension Mother   . Heart disease Mother   . Anxiety disorder Mother   . Thyroid disease Mother   . Obesity Mother   . Aneurysm Father   . Sudden death Father   . Hyperlipidemia Other   . Hypertension Other   . Diabetes Maternal Grandfather   . Coronary artery disease Other    ROS: Review of Systems  Respiratory: Negative for shortness of breath.   Cardiovascular: Negative for chest pain.  Gastrointestinal: Negative for nausea and vomiting.  Musculoskeletal:       Negative for muscle weakness.  Neurological:       Negative for lightheadedness.   PHYSICAL EXAM: Blood pressure 139/81, pulse 75, temperature 98 F (36.7 C), temperature source Oral, height 5\' 5"  (1.651 m), weight (!) 307 lb (139.3 kg), SpO2 99 %. Body mass index is 51.09 kg/m. Physical Exam Vitals signs reviewed.  Constitutional:      Appearance: Normal appearance. She is obese.  Cardiovascular:     Rate and Rhythm: Normal rate.     Pulses: Normal pulses.  Pulmonary:     Effort: Pulmonary effort is normal.     Breath sounds: Normal breath sounds.  Musculoskeletal: Normal range of motion.  Skin:    General: Skin is warm and dry.  Neurological:     Mental Status: She is alert and oriented to person, place, and time.  Psychiatric:        Behavior: Behavior normal.   RECENT LABS AND TESTS: BMET    Component Value Date/Time   NA 142 06/02/2019 1356   K 4.1 06/02/2019 1356   CL 103 06/02/2019 1356   CO2 24 06/02/2019 1356   GLUCOSE 101 (H) 06/02/2019  1356   GLUCOSE 99 06/04/2018 0958   BUN 13 06/02/2019 1356   CREATININE 0.80 06/02/2019 1356   CALCIUM 9.3 06/02/2019 1356   GFRNONAA 79 06/02/2019 1356   GFRAA 91 06/02/2019 1356   Lab Results  Component Value Date   HGBA1C 6.4 07/25/2017   Lab Results  Component Value Date   INSULIN 22.0 06/02/2019   CBC    Component Value Date/Time   WBC 4.0 06/02/2019 1356  WBC 6.7 09/09/2016 1502   RBC 4.72 06/02/2019 1356   RBC 4.64 09/09/2016 1502   HGB 14.2 06/02/2019 1356   HCT 42.9 06/02/2019 1356   PLT 217 06/02/2019 1356   MCV 91 06/02/2019 1356   MCH 30.1 06/02/2019 1356   MCHC 33.1 06/02/2019 1356   MCHC 33.4 09/09/2016 1502   RDW 14.5 06/02/2019 1356   LYMPHSABS 1.5 06/02/2019 1356   MONOABS 0.3 09/09/2016 1502   EOSABS 0.1 06/02/2019 1356   BASOSABS 0.0 06/02/2019 1356   Iron/TIBC/Ferritin/ %Sat No results found for: IRON, TIBC, FERRITIN, IRONPCTSAT Lipid Panel     Component Value Date/Time   CHOL 145 06/02/2019 1356   TRIG 77 06/02/2019 1356   HDL 40 06/02/2019 1356   CHOLHDL 3.6 06/02/2019 1356   CHOLHDL 3 06/04/2018 0958   VLDL 13.2 06/04/2018 0958   LDLCALC 90 06/02/2019 1356   LDLDIRECT 179.1 09/16/2012 0925   Hepatic Function Panel     Component Value Date/Time   PROT 7.3 06/02/2019 1356   ALBUMIN 4.2 06/02/2019 1356   AST 20 06/02/2019 1356   ALT 26 06/02/2019 1356   ALKPHOS 103 06/02/2019 1356   BILITOT 0.4 06/02/2019 1356   BILIDIR 0.2 04/13/2015 1357      Component Value Date/Time   TSH 0.500 06/02/2019 1356   TSH 1.42 06/04/2018 0958   TSH 1.186 10/10/2015 1523   Results for SONYIA, POESCHEL (MRN [id number]) as of 06/30/2019 07:39  Ref. Range 06/02/2019 13:56  Vitamin D, 25-Hydroxy Latest Ref Range: 30.0 - 100.0 ng/mL 6.3 (L)   OBESITY BEHAVIORAL INTERVENTION VISIT  Today's visit was #2   Starting weight: 308 lbs Starting date: 06/02/2019 Today's weight: 307 lbs  Today's date: 06/28/2019 Total lbs lost to date: 1    06/28/2019   Height 5\' 5"  (1.651 m)  Weight 307 lb (139.3 kg) (A)  BMI (Calculated) 51.09  BLOOD PRESSURE - SYSTOLIC [id number]  BLOOD PRESSURE - DIASTOLIC 81   Body Fat % [id number] %   ASK: We discussed the diagnosis of obesity with [name] today and Nanette agreed to give [place] permission to discuss obesity behavioral modification therapy today.  ASSESS: Dillyn has the diagnosis of obesity and her BMI today is 51.2. Hava is in the action stage of change.   ADVISE: Aley was educated on the multiple health risks of obesity as well as the benefit of weight loss to improve her health. She was advised of the need for long term treatment and the importance of lifestyle modifications to improve her current health and to decrease her risk of future health problems.  AGREE: Multiple dietary modification options and treatment options were discussed and  Tearsa agreed to follow the recommendations documented in the above note.  ARRANGE: Malyah was educated on the importance of frequent visits to treat obesity as outlined per CMS and USPSTF guidelines and agreed to schedule her next follow up appointment today.  Migdalia Dk, am acting as Location manager for [employer], DO   I have reviewed the above documentation for accuracy and completeness, and I agree with the above. -Jearld Lesch, DO

## 2019-07-09 ENCOUNTER — Other Ambulatory Visit: Payer: Self-pay

## 2019-07-09 ENCOUNTER — Ambulatory Visit (INDEPENDENT_AMBULATORY_CARE_PROVIDER_SITE_OTHER): Payer: 59

## 2019-07-09 DIAGNOSIS — Z23 Encounter for immunization: Secondary | ICD-10-CM | POA: Diagnosis not present

## 2019-07-13 ENCOUNTER — Encounter (INDEPENDENT_AMBULATORY_CARE_PROVIDER_SITE_OTHER): Payer: Self-pay | Admitting: Bariatrics

## 2019-07-13 ENCOUNTER — Other Ambulatory Visit: Payer: Self-pay

## 2019-07-13 ENCOUNTER — Ambulatory Visit (INDEPENDENT_AMBULATORY_CARE_PROVIDER_SITE_OTHER): Payer: 59 | Admitting: Bariatrics

## 2019-07-13 VITALS — BP 143/85 | HR 72 | Temp 97.7°F | Ht 65.0 in | Wt 309.0 lb

## 2019-07-13 DIAGNOSIS — E119 Type 2 diabetes mellitus without complications: Secondary | ICD-10-CM | POA: Diagnosis not present

## 2019-07-13 DIAGNOSIS — Z9189 Other specified personal risk factors, not elsewhere classified: Secondary | ICD-10-CM

## 2019-07-13 DIAGNOSIS — K5909 Other constipation: Secondary | ICD-10-CM

## 2019-07-13 DIAGNOSIS — E559 Vitamin D deficiency, unspecified: Secondary | ICD-10-CM

## 2019-07-13 DIAGNOSIS — Z6841 Body Mass Index (BMI) 40.0 and over, adult: Secondary | ICD-10-CM

## 2019-07-13 MED ORDER — VITAMIN D (ERGOCALCIFEROL) 1.25 MG (50000 UNIT) PO CAPS: 50000.0000 [IU] | ORAL_CAPSULE | ORAL | 0 refills | Status: DC

## 2019-07-13 NOTE — Progress Notes (Signed)
Office: [phone]  /  Fax: [phone]   HPI:   Chief Complaint: OBESITY Tracey Page is here to discuss her progress with her obesity treatment plan. She is on the Pescatarian eating plan + 100 calories and is following her eating plan approximately 70% of the time. She states she is walking 1 mile 2 times per week. Tracey Page is up 2 lbs, but is very constipated.  Her weight is (!) 309 lb (140.2 kg) today and has had a weight gain of 2 lbs since her last visit. She has lost 0 lbs since starting treatment with [place].  Diabetes II Tracey Page has a diagnosis of diabetes type II and is taking Trulicity. Tracey Page does not report checking her blood sugars. Last insulin was 22.0 on 06/02/2019. She has been working on intensive lifestyle modifications including diet, exercise, and weight loss to help control her blood glucose levels.  Vitamin D deficiency Tracey Page has a diagnosis of Vitamin D deficiency. Last Vitamin D 6.3 on 06/02/2019. She is currently taking prescription Vit D and denies nausea, vomiting or muscle weakness.  At risk for osteopenia and osteoporosis Tracey Page is at higher risk of osteopenia and osteoporosis due to Vitamin D deficiency.   Constipation Tracey Page notes constipation and is taking chia seeds. She reports no retained stool.  ASSESSMENT AND PLAN:  Type 2 diabetes mellitus without complication, without long-term current use of insulin (HCC)  Vitamin D deficiency - Plan: Vitamin D, Ergocalciferol, (DRISDOL) 1.25 MG (50000 UT) CAPS capsule  Other constipation  At risk for osteoporosis  Class 3 severe obesity with serious comorbidity and body mass index (BMI) of 50.0 to 59.9 in adult, unspecified obesity type ([place])  PLAN:  Diabetes II Anissa has been given extensive diabetes education by myself today including ideal fasting and post-prandial blood glucose readings, individual ideal HgA1c goals  and hypoglycemia prevention. We discussed the importance of good blood sugar control to  decrease the likelihood of diabetic complications such as nephropathy, neuropathy, limb loss, blindness, coronary artery disease, and death. We discussed the importance of intensive lifestyle modification including diet, exercise and weight loss as the first line treatment for diabetes. [name] agrees to continue her diabetes medications and will follow-up at the agreed upon time.  Vitamin D Deficiency [name] was informed that low Vitamin D levels contributes to fatigue and are associated with obesity, breast, and colon cancer. She agrees to continue to take prescription Vit D @ 50,000 IU every week #4 with 0 refills and will follow-up for routine testing of Vitamin D, at least 2-3 times per year. She was informed of the risk of over-replacement of Vitamin D and agrees to not increase her dose unless she discusses this with [place] first. [name] agrees to follow-up with our clinic in 2 weeks.  At risk for osteopenia and osteoporosis Tracey Page was given extended  (15 minutes) osteoporosis prevention counseling today. Tracey Page is at risk for osteopenia and osteoporosis due to her Vitamin D deficiency. She was encouraged to take her Vitamin D and follow her higher calcium diet and increase strengthening exercise to help strengthen her bones and decrease her risk of osteopenia and osteoporosis.  Constipation Tracey Page was instructed to take MiraLax 3-4 times weekly and Dulcolax OTC initially. She was advised to increase her water intake and raw vegetables.  Obesity Tracey Page is currently in the action stage of change. As such, her goal is to continue with weight loss efforts. She has agreed to follow the Pescatarian eating plan + 100 calories. Tracey Page will work  on meal planning, intentional eating, and will be more focused on the plan. Tracey Page has been instructed to work up to a goal of 150 minutes of combined cardio and strengthening exercise per week for weight loss and overall health benefits. We discussed the following  Behavioral Modification Strategies today: increasing lean protein intake, decreasing simple carbohydrates, increasing vegetables, increase H20 intake, decrease eating out, no skipping meals, work on meal planning and easy cooking plans, keeping healthy foods in the home, and planning for success.  Tracey Page has agreed to follow-up with our clinic in 2 weeks. She was informed of the importance of frequent follow-up visits to maximize her success with intensive lifestyle modifications for her multiple health conditions.  ALLERGIES: Allergies  Allergen Reactions  . Diovan [Valsartan] Swelling and Other (See Comments)    angioedema  . Iodine Swelling  . Lisinopril Swelling  . Shellfish Allergy Swelling    Swelling of tongue  . Sulfonamide Derivatives Swelling    Swelling in hand/knees    MEDICATIONS: Current Outpatient Medications on File Prior to Visit  Medication Sig Dispense Refill  . ALPRAZolam (XANAX) 0.25 MG tablet TAKE ONE TABLET BY MOUTH TWICE DAILY AS NEEDED FOR ANXIETY 45 tablet 0  . amLODipine (NORVASC) 10 MG tablet Take 1 tablet by mouth once daily 90 tablet 1  . atorvastatin (LIPITOR) 20 MG tablet Take 1 tablet by mouth once daily 90 tablet 1  . azelastine (ASTELIN) 0.1 % nasal spray Place 1 spray into both nostrils 2 (two) times daily. Use in each nostril as directed 30 mL 12  . doxycycline (VIBRAMYCIN) 100 MG capsule Take 1 capsule (100 mg total) by mouth 2 (two) times daily. 20 capsule 0  . EPINEPHrine 0.3 mg/0.3 mL IJ SOAJ injection INJECT CONTENTS OF 1 PEN AS NEEDED FOR ALLERGIC REACTION    . fluticasone (FLONASE) 50 MCG/ACT nasal spray Place 2 sprays into both nostrils daily. 16 g 6  . furosemide (LASIX) 20 MG tablet TAKE 1 TO 2 TABLETS BY MOUTH ONCE DAILY FOR EDEMA . APPOINTMENT REQUIRED FOR FUTURE REFILLS 60 tablet 2  . levocetirizine (XYZAL) 5 MG tablet Take 1 tablet (5 mg total) by mouth every evening. 30 tablet 5  . metoprolol succinate (TOPROL-XL) 50 MG 24 hr tablet TAKE  3 TABLETS BY MOUTH ONCE DAILY IMMEDIATELY  FOLLOWING  A  MEAL 270 tablet 1  . NONFORMULARY OR COMPOUNDED ITEM Compression socks  30-40 mmhg  #1  As directed 1 each 0  . pantoprazole (PROTONIX) 20 MG tablet Take 1 tablet (20 mg total) by mouth daily. 90 tablet 1  . SYNTHROID 137 MCG tablet Take 1 tablet by mouth every morning.    . TRULICITY 1.5 0000000 SOPN Inject 1.5 mg into the skin once a week.     No current facility-administered medications on file prior to visit.     PAST MEDICAL HISTORY: Past Medical History:  Diagnosis Date  . Anxiety   . Bilateral swelling of feet and ankles   . Diverticulitis   . Food allergy   . Hyperlipidemia   . Hypertension   . Hypothyroidism   . Lymphedema   . Obesity   . Pre-diabetes   . Thyroid disease     PAST SURGICAL HISTORY: Past Surgical History:  Procedure Laterality Date  . COLONOSCOPY WITH PROPOFOL N/A 12/17/2018   Procedure: COLONOSCOPY WITH PROPOFOL;  Surgeon: Milus Banister, MD;  Location: [facility];  Service: Endoscopy;  Laterality: N/A;  . ECTOPIC PREGNANCY SURGERY    .  POLYPECTOMY  12/17/2018   Procedure: POLYPECTOMY;  Surgeon: Milus Banister, MD;  Location: [facility];  Service: Endoscopy;;    SOCIAL HISTORY: Social History   Tobacco Use  . Smoking status: Former Research scientist (life sciences)  . Smokeless tobacco: Never Used  Substance Use Topics  . Alcohol use: Yes    Comment: socially, not even on weekly basis  . Drug use: No    FAMILY HISTORY: Family History  Problem Relation Age of Onset  . Arthritis Mother   . Hypertension Mother   . Heart disease Mother   . Anxiety disorder Mother   . Thyroid disease Mother   . Obesity Mother   . Aneurysm Father   . Sudden death Father   . Hyperlipidemia Other   . Hypertension Other   . Diabetes Maternal Grandfather   . Coronary artery disease Other    ROS: Review of Systems  Gastrointestinal: Negative for nausea and vomiting.  Musculoskeletal:       Negative for muscle  weakness.   PHYSICAL EXAM: Blood pressure (!) 143/85, pulse 72, temperature 97.7 F (36.5 C), temperature source Oral, height 5\' 5"  (1.651 m), weight (!) 309 lb (140.2 kg), SpO2 99 %. Body mass index is 51.42 kg/m. Physical Exam Vitals signs reviewed.  Constitutional:      Appearance: Normal appearance. She is obese.  Cardiovascular:     Rate and Rhythm: Normal rate.     Pulses: Normal pulses.  Pulmonary:     Effort: Pulmonary effort is normal.     Breath sounds: Normal breath sounds.  Musculoskeletal: Normal range of motion.  Skin:    General: Skin is warm and dry.  Neurological:     Mental Status: She is alert and oriented to person, place, and time.  Psychiatric:        Behavior: Behavior normal.   RECENT LABS AND TESTS: BMET    Component Value Date/Time   NA 142 06/02/2019 1356   K 4.1 06/02/2019 1356   CL 103 06/02/2019 1356   CO2 24 06/02/2019 1356   GLUCOSE 101 (H) 06/02/2019 1356   GLUCOSE 99 06/04/2018 0958   BUN 13 06/02/2019 1356   CREATININE 0.80 06/02/2019 1356   CALCIUM 9.3 06/02/2019 1356   GFRNONAA 79 06/02/2019 1356   GFRAA 91 06/02/2019 1356   Lab Results  Component Value Date   HGBA1C 6.4 07/25/2017   Lab Results  Component Value Date   INSULIN 22.0 06/02/2019   CBC    Component Value Date/Time   WBC 4.0 06/02/2019 1356   WBC 6.7 09/09/2016 1502   RBC 4.72 06/02/2019 1356   RBC 4.64 09/09/2016 1502   HGB 14.2 06/02/2019 1356   HCT 42.9 06/02/2019 1356   PLT 217 06/02/2019 1356   MCV 91 06/02/2019 1356   MCH 30.1 06/02/2019 1356   MCHC 33.1 06/02/2019 1356   MCHC 33.4 09/09/2016 1502   RDW 14.5 06/02/2019 1356   LYMPHSABS 1.5 06/02/2019 1356   MONOABS 0.3 09/09/2016 1502   EOSABS 0.1 06/02/2019 1356   BASOSABS 0.0 06/02/2019 1356   Iron/TIBC/Ferritin/ %Sat No results found for: IRON, TIBC, FERRITIN, IRONPCTSAT Lipid Panel     Component Value Date/Time   CHOL 145 06/02/2019 1356   TRIG 77 06/02/2019 1356   HDL 40 06/02/2019  1356   CHOLHDL 3.6 06/02/2019 1356   CHOLHDL 3 06/04/2018 0958   VLDL 13.2 06/04/2018 0958   LDLCALC 90 06/02/2019 1356   LDLDIRECT 179.1 09/16/2012 0925   Hepatic Function Panel  Component Value Date/Time   PROT 7.3 06/02/2019 1356   ALBUMIN 4.2 06/02/2019 1356   AST 20 06/02/2019 1356   ALT 26 06/02/2019 1356   ALKPHOS 103 06/02/2019 1356   BILITOT 0.4 06/02/2019 1356   BILIDIR 0.2 04/13/2015 1357      Component Value Date/Time   TSH 0.500 06/02/2019 1356   TSH 1.42 06/04/2018 0958   TSH 1.186 10/10/2015 1523   Results for KERI, DUEL (MRN [id number]) as of 07/13/2019 15:52  Ref. Range 06/02/2019 13:56  Vitamin D, 25-Hydroxy Latest Ref Range: 30.0 - 100.0 ng/mL 6.3 (L)   OBESITY BEHAVIORAL INTERVENTION VISIT  Today's visit was #3  Starting weight: 308 lbs Starting date: 06/02/2019 Today's weight: 309 lbs  Today's date: 07/13/2019 Total lbs lost to date: 0    07/13/2019  Height 5\' 5"  (1.651 m)  Weight 309 lb (140.2 kg) (A)  BMI (Calculated) 51.42  BLOOD PRESSURE - SYSTOLIC [id number]  BLOOD PRESSURE - DIASTOLIC 85   Body Fat % 60 %   ASK: We discussed the diagnosis of obesity with Tracey Page today and Tracey Page agreed to give [place] permission to discuss obesity behavioral modification therapy today.  ASSESS: Janalyn has the diagnosis of obesity and her BMI today is 51.5. Cerria is in the action stage of change.   ADVISE: Tracey Page was educated on the multiple health risks of obesity as well as the benefit of weight loss to improve her health. She was advised of the need for long term treatment and the importance of lifestyle modifications to improve her current health and to decrease her risk of future health problems.  AGREE: Multiple dietary modification options and treatment options were discussed and  Tracey Page agreed to follow the recommendations documented in the above note.  ARRANGE: Tracey Page was educated on the importance of frequent visits to treat obesity as  outlined per CMS and USPSTF guidelines and agreed to schedule her next follow up appointment today.  Tracey Page, am acting as Location manager for [employer], DO  I have reviewed the above documentation for accuracy and completeness, and I agree with the above. -Jearld Lesch, DO

## 2019-07-14 ENCOUNTER — Encounter (INDEPENDENT_AMBULATORY_CARE_PROVIDER_SITE_OTHER): Payer: Self-pay | Admitting: Bariatrics

## 2019-07-27 ENCOUNTER — Ambulatory Visit (INDEPENDENT_AMBULATORY_CARE_PROVIDER_SITE_OTHER): Payer: 59 | Admitting: Bariatrics

## 2019-08-03 ENCOUNTER — Other Ambulatory Visit: Payer: Self-pay

## 2019-08-03 ENCOUNTER — Encounter (INDEPENDENT_AMBULATORY_CARE_PROVIDER_SITE_OTHER): Payer: Self-pay | Admitting: Family Medicine

## 2019-08-03 ENCOUNTER — Ambulatory Visit (INDEPENDENT_AMBULATORY_CARE_PROVIDER_SITE_OTHER): Payer: 59 | Admitting: Family Medicine

## 2019-08-03 VITALS — BP 132/76 | HR 68 | Temp 98.1°F | Ht 65.0 in | Wt 306.0 lb

## 2019-08-03 DIAGNOSIS — Z9189 Other specified personal risk factors, not elsewhere classified: Secondary | ICD-10-CM | POA: Diagnosis not present

## 2019-08-03 DIAGNOSIS — E119 Type 2 diabetes mellitus without complications: Secondary | ICD-10-CM | POA: Diagnosis not present

## 2019-08-03 DIAGNOSIS — K5909 Other constipation: Secondary | ICD-10-CM | POA: Diagnosis not present

## 2019-08-03 DIAGNOSIS — E559 Vitamin D deficiency, unspecified: Secondary | ICD-10-CM | POA: Diagnosis not present

## 2019-08-03 DIAGNOSIS — Z6841 Body Mass Index (BMI) 40.0 and over, adult: Secondary | ICD-10-CM

## 2019-08-04 NOTE — Progress Notes (Signed)
Office: [phone]  /  Fax: [phone]   HPI:   Chief Complaint: OBESITY Sharilyn is here to discuss her progress with her obesity treatment plan. She is on the Pescatarian eating plan and is following her eating plan approximately 70 % of the time. She states she is exercising 0 minutes 0 times per week. Tinamarie used to skip meals, but she is now doing better. Angelyn denies polyphagia. She is doing much better with meal skipping. She is not eating the bread on the plan. Kealani is eating a salad with her protein at lunch. Her weight is (!) 306 lb (138.8 kg) today and has had a weight loss of 3 pounds over a period of 3 weeks since her last visit. She has lost 2 lbs since starting treatment with [place].  Diabetes II Sakai has a diagnosis of diabetes type II and she is well controlled on Trulicity. She will be switched from Trulicity to Rybelsus within the next few weeks by endocrinology (Dr. Chalmers Cater). She denies polyphagia. She was taken off Metformin by Dr. Suzette Battiest. She has been working on intensive lifestyle modifications including diet, exercise, and weight loss to help control her blood glucose levels.  Constipation Jatavia is only having BM once weekly vs. every other day. She denies blood in her stool. She has had constipation since starting Trulicity two months ago.   Vitamin D deficiency Amariya has a diagnosis of vitamin D deficiency. Her last vitamin D level was very low at 6.3 on 06/02/19. She is currently taking vit D. Laretha admits to fatigue, but this is improving. Arieanna denies nausea, vomiting or muscle weakness.  At risk for osteopenia and osteoporosis Anamaria is at higher risk of osteopenia and osteoporosis due to vitamin D deficiency.   ASSESSMENT AND PLAN:  Type 2 diabetes mellitus without complication, without long-term current use of insulin (HCC)  Other constipation  Vitamin D deficiency  At risk for osteoporosis  Class 3 severe obesity with serious comorbidity and body mass  index (BMI) of 50.0 to 59.9 in adult, unspecified obesity type ([place])  PLAN:  Diabetes II Clarece has been given extensive diabetes education by myself today including ideal fasting and post-prandial blood glucose readings, individual ideal Hgb A1c goals and hypoglycemia prevention. We discussed the importance of good blood sugar control to decrease the likelihood of diabetic complications such as nephropathy, neuropathy, limb loss, blindness, coronary artery disease, and death. We discussed the importance of intensive lifestyle modification including diet, exercise and weight loss as the first line treatment for diabetes. Maebelle will continue to follow up with endocrinology and she will start Rybelsus next week as planned. Zakari will follow up at the agreed upon time.  Constipation Haide was informed decrease bowel movement frequency is normal while losing weight, but stools should not be hard or painful. Syrita will start daily stool softener and if that does not work, she will try Miralax every other day. She was advised to increase her H20 intake and work on increasing her fiber intake. High fiber foods were discussed today.  Vitamin D Deficiency Dinia was informed that low vitamin D levels contributes to fatigue and are associated with obesity, breast, and colon cancer. Delynda agrees to continue to take prescription Vit D @50 ,000 IU every week and she will follow up for routine testing of vitamin D, at least 2-3 times per year. She was informed of the risk of over-replacement of vitamin D and agrees to not increase her dose unless she discusses this with  [place] first.  At risk for osteopenia and osteoporosis Miata was given extended  (15 minutes) osteoporosis prevention counseling today. Rhondalyn is at risk for osteopenia and osteoporosis due to her vitamin D deficiency. She was encouraged to take her vitamin D and follow her higher calcium diet and increase strengthening exercise to help strengthen her bones  and decrease her risk of osteopenia and osteoporosis.  Obesity Valynn is currently in the action stage of change. As such, her goal is to continue with weight loss efforts She has agreed to follow the Pescatarian eating plan Kalena has been instructed to work up to a goal of 150 minutes of combined cardio and strengthening exercise per week for weight loss and overall health benefits. We discussed the following Behavioral Modification Strategies today: planning for success, increase H2O intake, no skipping meals, increasing lean protein intake  She may have a boiled egg, rather than a slice of bread at breakfast. Lunch options were provided.  Wadie has agreed to follow up with our clinic in 2 to 3 weeks. She was informed of the importance of frequent follow up visits to maximize her success with intensive lifestyle modifications for her multiple health conditions.  ALLERGIES: Allergies  Allergen Reactions  . Diovan [Valsartan] Swelling and Other (See Comments)    angioedema  . Iodine Swelling  . Lisinopril Swelling  . Shellfish Allergy Swelling    Swelling of tongue  . Sulfonamide Derivatives Swelling    Swelling in hand/knees    MEDICATIONS: Current Outpatient Medications on File Prior to Visit  Medication Sig Dispense Refill  . ALPRAZolam (XANAX) 0.25 MG tablet TAKE ONE TABLET BY MOUTH TWICE DAILY AS NEEDED FOR ANXIETY 45 tablet 0  . amLODipine (NORVASC) 10 MG tablet Take 1 tablet by mouth once daily 90 tablet 1  . atorvastatin (LIPITOR) 20 MG tablet Take 1 tablet by mouth once daily 90 tablet 1  . azelastine (ASTELIN) 0.1 % nasal spray Place 1 spray into both nostrils 2 (two) times daily. Use in each nostril as directed 30 mL 12  . doxycycline (VIBRAMYCIN) 100 MG capsule Take 1 capsule (100 mg total) by mouth 2 (two) times daily. 20 capsule 0  . EPINEPHrine 0.3 mg/0.3 mL IJ SOAJ injection INJECT CONTENTS OF 1 PEN AS NEEDED FOR ALLERGIC REACTION    . fluticasone (FLONASE) 50 MCG/ACT  nasal spray Place 2 sprays into both nostrils daily. 16 g 6  . furosemide (LASIX) 20 MG tablet TAKE 1 TO 2 TABLETS BY MOUTH ONCE DAILY FOR EDEMA . APPOINTMENT REQUIRED FOR FUTURE REFILLS 60 tablet 2  . levocetirizine (XYZAL) 5 MG tablet Take 1 tablet (5 mg total) by mouth every evening. 30 tablet 5  . metoprolol succinate (TOPROL-XL) 50 MG 24 hr tablet TAKE 3 TABLETS BY MOUTH ONCE DAILY IMMEDIATELY  FOLLOWING  A  MEAL 270 tablet 1  . NONFORMULARY OR COMPOUNDED ITEM Compression socks  30-40 mmhg  #1  As directed 1 each 0  . pantoprazole (PROTONIX) 20 MG tablet Take 1 tablet (20 mg total) by mouth daily. 90 tablet 1  . SYNTHROID 137 MCG tablet Take 1 tablet by mouth every morning.    . TRULICITY 1.5 0000000 SOPN Inject 1.5 mg into the skin once a week.    . Vitamin D, Ergocalciferol, (DRISDOL) 1.25 MG (50000 UT) CAPS capsule Take 1 capsule (50,000 Units total) by mouth every 7 (seven) days. 4 capsule 0   No current facility-administered medications on file prior to visit.  PAST MEDICAL HISTORY: Past Medical History:  Diagnosis Date  . Anxiety   . Bilateral swelling of feet and ankles   . Diverticulitis   . Food allergy   . Hyperlipidemia   . Hypertension   . Hypothyroidism   . Lymphedema   . Obesity   . Pre-diabetes   . Thyroid disease     PAST SURGICAL HISTORY: Past Surgical History:  Procedure Laterality Date  . COLONOSCOPY WITH PROPOFOL N/A 12/17/2018   Procedure: COLONOSCOPY WITH PROPOFOL;  Surgeon: Milus Banister, MD;  Location: [facility];  Service: Endoscopy;  Laterality: N/A;  . ECTOPIC PREGNANCY SURGERY    . POLYPECTOMY  12/17/2018   Procedure: POLYPECTOMY;  Surgeon: Milus Banister, MD;  Location: [facility];  Service: Endoscopy;;    SOCIAL HISTORY: Social History   Tobacco Use  . Smoking status: Former Research scientist (life sciences)  . Smokeless tobacco: Never Used  Substance Use Topics  . Alcohol use: Yes    Comment: socially, not even on weekly basis  . Drug use: No     FAMILY HISTORY: Family History  Problem Relation Age of Onset  . Arthritis Mother   . Hypertension Mother   . Heart disease Mother   . Anxiety disorder Mother   . Thyroid disease Mother   . Obesity Mother   . Aneurysm Father   . Sudden death Father   . Hyperlipidemia Other   . Hypertension Other   . Diabetes Maternal Grandfather   . Coronary artery disease Other     ROS: Review of Systems  Constitutional: Positive for malaise/fatigue and weight loss.  Gastrointestinal: Positive for constipation. Negative for nausea and vomiting.       Negative for hematochezia  Musculoskeletal:       Negative for muscle weakness  Endo/Heme/Allergies:       Negative for polyphagia    PHYSICAL EXAM: Blood pressure 132/76, pulse 68, temperature 98.1 F (36.7 C), temperature source Oral, height 5\' 5"  (1.651 m), weight (!) 306 lb (138.8 kg), SpO2 99 %. Body mass index is 50.92 kg/m. Physical Exam Vitals signs reviewed.  Constitutional:      Appearance: Normal appearance. She is well-developed. She is obese.  Cardiovascular:     Rate and Rhythm: Normal rate.  Pulmonary:     Effort: Pulmonary effort is normal.  Musculoskeletal: Normal range of motion.  Skin:    General: Skin is warm and dry.  Neurological:     Mental Status: She is alert and oriented to person, place, and time.  Psychiatric:        Mood and Affect: Mood normal.        Behavior: Behavior normal.     RECENT LABS AND TESTS: BMET    Component Value Date/Time   NA 142 06/02/2019 1356   K 4.1 06/02/2019 1356   CL 103 06/02/2019 1356   CO2 24 06/02/2019 1356   GLUCOSE 101 (H) 06/02/2019 1356   GLUCOSE 99 06/04/2018 0958   BUN 13 06/02/2019 1356   CREATININE 0.80 06/02/2019 1356   CALCIUM 9.3 06/02/2019 1356   GFRNONAA 79 06/02/2019 1356   GFRAA 91 06/02/2019 1356   Lab Results  Component Value Date   HGBA1C 6.4 07/25/2017   Lab Results  Component Value Date   INSULIN 22.0 06/02/2019   CBC    Component  Value Date/Time   WBC 4.0 06/02/2019 1356   WBC 6.7 09/09/2016 1502   RBC 4.72 06/02/2019 1356   RBC 4.64 09/09/2016 1502  HGB 14.2 06/02/2019 1356   HCT 42.9 06/02/2019 1356   PLT 217 06/02/2019 1356   MCV 91 06/02/2019 1356   MCH 30.1 06/02/2019 1356   MCHC 33.1 06/02/2019 1356   MCHC 33.4 09/09/2016 1502   RDW 14.5 06/02/2019 1356   LYMPHSABS 1.5 06/02/2019 1356   MONOABS 0.3 09/09/2016 1502   EOSABS 0.1 06/02/2019 1356   BASOSABS 0.0 06/02/2019 1356   Iron/TIBC/Ferritin/ %Sat No results found for: IRON, TIBC, FERRITIN, IRONPCTSAT Lipid Panel     Component Value Date/Time   CHOL 145 06/02/2019 1356   TRIG 77 06/02/2019 1356   HDL 40 06/02/2019 1356   CHOLHDL 3.6 06/02/2019 1356   CHOLHDL 3 06/04/2018 0958   VLDL 13.2 06/04/2018 0958   LDLCALC 90 06/02/2019 1356   LDLDIRECT 179.1 09/16/2012 0925   Hepatic Function Panel     Component Value Date/Time   PROT 7.3 06/02/2019 1356   ALBUMIN 4.2 06/02/2019 1356   AST 20 06/02/2019 1356   ALT 26 06/02/2019 1356   ALKPHOS 103 06/02/2019 1356   BILITOT 0.4 06/02/2019 1356   BILIDIR 0.2 04/13/2015 1357      Component Value Date/Time   TSH 0.500 06/02/2019 1356   TSH 1.42 06/04/2018 0958   TSH 1.186 10/10/2015 1523     Ref. Range 06/02/2019 13:56  Vitamin D, 25-Hydroxy Latest Ref Range: 30.0 - 100.0 ng/mL 6.3 (L)    OBESITY BEHAVIORAL INTERVENTION VISIT  Today's visit was # 4   Starting weight: 308 lbs Starting date: 06/02/2019 Today's weight : 306 lbs Today's date: 08/03/2019 Total lbs lost to date: 2    08/03/2019  Height 5\' 5"  (1.651 m)  Weight 306 lb (138.8 kg) (A)  BMI (Calculated) 50.92  BLOOD PRESSURE - SYSTOLIC [id number]  BLOOD PRESSURE - DIASTOLIC 76   Body Fat % 0000000 %    ASK: We discussed the diagnosis of obesity with [name] today and Shawnelle agreed to give [place] permission to discuss obesity behavioral modification therapy today.  ASSESS: Lili has the diagnosis of obesity and her BMI today  is 50.92 Sana is in the action stage of change   ADVISE: Perina was educated on the multiple health risks of obesity as well as the benefit of weight loss to improve her health. She was advised of the need for long term treatment and the importance of lifestyle modifications to improve her current health and to decrease her risk of future health problems.  AGREE: Multiple dietary modification options and treatment options were discussed and  [place] agreed to follow the recommendations documented in the above note.  ARRANGE: Keltie was educated on the importance of frequent visits to treat obesity as outlined per CMS and USPSTF guidelines and agreed to schedule her next follow up appointment today.  I, Doreene Nest, am acting as transcriptionist for [employer], FNP-C  I have reviewed the above documentation for accuracy and completeness, and I agree with the above.  - Breeze Berringer, FNP-C.

## 2019-08-05 ENCOUNTER — Encounter (INDEPENDENT_AMBULATORY_CARE_PROVIDER_SITE_OTHER): Payer: Self-pay | Admitting: Family Medicine

## 2019-08-05 DIAGNOSIS — E119 Type 2 diabetes mellitus without complications: Secondary | ICD-10-CM | POA: Insufficient documentation

## 2019-08-05 DIAGNOSIS — E559 Vitamin D deficiency, unspecified: Secondary | ICD-10-CM | POA: Insufficient documentation

## 2019-08-24 ENCOUNTER — Other Ambulatory Visit: Payer: Self-pay

## 2019-08-24 ENCOUNTER — Encounter (INDEPENDENT_AMBULATORY_CARE_PROVIDER_SITE_OTHER): Payer: Self-pay | Admitting: Family Medicine

## 2019-08-24 ENCOUNTER — Ambulatory Visit (INDEPENDENT_AMBULATORY_CARE_PROVIDER_SITE_OTHER): Payer: 59 | Admitting: Family Medicine

## 2019-08-24 VITALS — BP 136/77 | HR 70 | Temp 98.0°F | Ht 65.0 in | Wt 305.0 lb

## 2019-08-24 DIAGNOSIS — K5909 Other constipation: Secondary | ICD-10-CM | POA: Diagnosis not present

## 2019-08-24 DIAGNOSIS — E119 Type 2 diabetes mellitus without complications: Secondary | ICD-10-CM | POA: Diagnosis not present

## 2019-08-24 DIAGNOSIS — Z9189 Other specified personal risk factors, not elsewhere classified: Secondary | ICD-10-CM | POA: Diagnosis not present

## 2019-08-24 DIAGNOSIS — E559 Vitamin D deficiency, unspecified: Secondary | ICD-10-CM

## 2019-08-24 DIAGNOSIS — Z6841 Body Mass Index (BMI) 40.0 and over, adult: Secondary | ICD-10-CM

## 2019-08-24 MED ORDER — VITAMIN D (ERGOCALCIFEROL) 1.25 MG (50000 UNIT) PO CAPS: 50000.0000 [IU] | ORAL_CAPSULE | ORAL | 0 refills | Status: DC

## 2019-08-25 ENCOUNTER — Encounter (INDEPENDENT_AMBULATORY_CARE_PROVIDER_SITE_OTHER): Payer: Self-pay | Admitting: Family Medicine

## 2019-08-25 NOTE — Progress Notes (Signed)
Office: [phone]  /  Fax: [phone]   HPI:   Chief Complaint: OBESITY Tracey Page is here to discuss her progress with her obesity treatment plan. She is on the  follow the Pescatarian eating plan and is following her eating plan approximately 70 % of the time. She states she is exercising 0 minutes 0 times per week. Tracey Page tends to skip lunch due to work. She does reports extra calories in her diet that aren't on plan such as  is adding olive oil to vegetables and coffee creamer in the morning.  Her weight is (!) 305 lb (138.3 kg) today and has had a weight loss of 1 pounds over a period of 3 weeks since her last visit. She has lost 3 lbs since starting treatment with [place].  Diabetes II Tracey Page has a diagnosis of diabetes type II is well controlled. Tracey Page recently switched to Rybelsus from Trulicity by her endocrinologist (Dr. Chalmers Cater). She notes that appetite is better controlled with Rybelsus. She denies nausea. She admits to constipation. Her A1c was 6.7 in September 2020. Tracey Page does not check her sugars. She has been working on intensive lifestyle modifications including diet, exercise, and weight loss to help control her blood glucose levels.  Constipation Tracey Page notes constipation, which has worsened with Rybelsus. She denies blood in her stool. She is eating one tablespoon of chia seeds daily for this.  Vitamin D deficiency Tracey Page has a diagnosis of vitamin D deficiency. Her vitamin D level was very low at 6.4 on 06/02/19. Tracey Page is currently taking vit D and she denies fatigue, nausea, vomiting or muscle weakness.  At risk for osteopenia and osteoporosis Tracey Page is at higher risk of osteopenia and osteoporosis due to vitamin D deficiency.   ASSESSMENT AND PLAN:  Vitamin D deficiency - Plan: Vitamin D, Ergocalciferol, (DRISDOL) 1.25 MG (50000 UT) CAPS capsule  Type 2 diabetes mellitus without complication, without long-term current use of insulin (HCC)  Other constipation  At risk for  osteoporosis  Class 3 severe obesity with serious comorbidity and body mass index (BMI) of 50.0 to 59.9 in adult, unspecified obesity type ([place])  PLAN:  Diabetes II Tracey Page has been given extensive diabetes education by myself today including ideal fasting and post-prandial blood glucose readings, individual ideal Hgb A1c goals and hypoglycemia prevention. We discussed the importance of good blood sugar control to decrease the likelihood of diabetic complications such as nephropathy, neuropathy, limb loss, blindness, coronary artery disease, and death. We discussed the importance of intensive lifestyle modification including diet, exercise and weight loss as the first line treatment for diabetes. Tracey Page will continue Rybelsus and she will follow up at the agreed upon time.  Constipation Tracey Page may use smooth move tea. She will increase fiber with Citracel and she will increase her H20 intake. Tracey Page will stop eating chia seeds to reduce constipation..   Vitamin D Deficiency Tracey Page was informed that low vitamin D levels contributes to fatigue and are associated with obesity, breast, and colon cancer. Tracey Page agrees to continue to take prescription Vit D @50 ,000 IU every week #4 with no refills and she will follow up for routine testing of vitamin D, at least 2-3 times per year. She was informed of the risk of over-replacement of vitamin D and agrees to not increase her dose unless she discusses this with [place] first. Tracey Page agrees to follow up with our clinic in 3 weeks.  At risk for osteopenia and osteoporosis Tracey Page was given extended  (15 minutes) osteoporosis prevention counseling  today. Tracey Page is at risk for osteopenia and osteoporosis due to her vitamin D deficiency. She was encouraged to take her vitamin D and follow her higher calcium diet and increase strengthening exercise to help strengthen her bones and decrease her risk of osteopenia and osteoporosis.  Obesity Tracey Page is currently in the action stage  of change. As such, her goal is to continue with weight loss efforts She has agreed to follow the Pescatarian eating plan We discussed the following Behavioral Modification Strategies today: planning for success, increase H2O intake, no skipping meals, increasing lean protein intake and holiday eating strategies   We discussed lunch strategies. Handouts for seasonings were given to patient. Tracey Page may have coffee creamer instead of once slice of bread in the morning. She will stop using oil on vegetables.   Tracey Page has agreed to follow up with our clinic in 3 weeks. She was informed of the importance of frequent follow up visits to maximize her success with intensive lifestyle modifications for her multiple health conditions.  ALLERGIES: Allergies  Allergen Reactions  . Diovan [Valsartan] Swelling and Other (See Comments)    angioedema  . Iodine Swelling  . Lisinopril Swelling  . Shellfish Allergy Swelling    Swelling of tongue  . Sulfonamide Derivatives Swelling    Swelling in hand/knees    MEDICATIONS: Current Outpatient Medications on File Prior to Visit  Medication Sig Dispense Refill  . ALPRAZolam (XANAX) 0.25 MG tablet TAKE ONE TABLET BY MOUTH TWICE DAILY AS NEEDED FOR ANXIETY 45 tablet 0  . amLODipine (NORVASC) 10 MG tablet Take 1 tablet by mouth once daily 90 tablet 1  . atorvastatin (LIPITOR) 20 MG tablet Take 1 tablet by mouth once daily 90 tablet 1  . azelastine (ASTELIN) 0.1 % nasal spray Place 1 spray into both nostrils 2 (two) times daily. Use in each nostril as directed 30 mL 12  . doxycycline (VIBRAMYCIN) 100 MG capsule Take 1 capsule (100 mg total) by mouth 2 (two) times daily. 20 capsule 0  . EPINEPHrine 0.3 mg/0.3 mL IJ SOAJ injection INJECT CONTENTS OF 1 PEN AS NEEDED FOR ALLERGIC REACTION    . fluticasone (FLONASE) 50 MCG/ACT nasal spray Place 2 sprays into both nostrils daily. 16 g 6  . furosemide (LASIX) 20 MG tablet TAKE 1 TO 2 TABLETS BY MOUTH ONCE DAILY FOR EDEMA  . APPOINTMENT REQUIRED FOR FUTURE REFILLS 60 tablet 2  . levocetirizine (XYZAL) 5 MG tablet Take 1 tablet (5 mg total) by mouth every evening. 30 tablet 5  . metoprolol succinate (TOPROL-XL) 50 MG 24 hr tablet TAKE 3 TABLETS BY MOUTH ONCE DAILY IMMEDIATELY  FOLLOWING  A  MEAL 270 tablet 1  . NONFORMULARY OR COMPOUNDED ITEM Compression socks  30-40 mmhg  #1  As directed 1 each 0  . pantoprazole (PROTONIX) 20 MG tablet Take 1 tablet (20 mg total) by mouth daily. 90 tablet 1  . Semaglutide (RYBELSUS) 7 MG TABS Take 7 mg by mouth daily.    Marland Kitchen SYNTHROID 137 MCG tablet Take 1 tablet by mouth every morning.     No current facility-administered medications on file prior to visit.     PAST MEDICAL HISTORY: Past Medical History:  Diagnosis Date  . Anxiety   . Bilateral swelling of feet and ankles   . Diverticulitis   . Food allergy   . Hyperlipidemia   . Hypertension   . Hypothyroidism   . Lymphedema   . Obesity   . Pre-diabetes   . Thyroid  disease     PAST SURGICAL HISTORY: Past Surgical History:  Procedure Laterality Date  . COLONOSCOPY WITH PROPOFOL N/A 12/17/2018   Procedure: COLONOSCOPY WITH PROPOFOL;  Surgeon: Milus Banister, MD;  Location: [facility];  Service: Endoscopy;  Laterality: N/A;  . ECTOPIC PREGNANCY SURGERY    . POLYPECTOMY  12/17/2018   Procedure: POLYPECTOMY;  Surgeon: Milus Banister, MD;  Location: [facility];  Service: Endoscopy;;    SOCIAL HISTORY: Social History   Tobacco Use  . Smoking status: Former Research scientist (life sciences)  . Smokeless tobacco: Never Used  Substance Use Topics  . Alcohol use: Yes    Comment: socially, not even on weekly basis  . Drug use: No    FAMILY HISTORY: Family History  Problem Relation Age of Onset  . Arthritis Mother   . Hypertension Mother   . Heart disease Mother   . Anxiety disorder Mother   . Thyroid disease Mother   . Obesity Mother   . Aneurysm Father   . Sudden death Father   . Hyperlipidemia Other   . Hypertension Other    . Diabetes Maternal Grandfather   . Coronary artery disease Other     ROS: Review of Systems  Constitutional: Positive for weight loss. Negative for malaise/fatigue.  Gastrointestinal: Positive for constipation. Negative for blood in stool, nausea and vomiting.  Musculoskeletal:       Negative for muscle weakness    PHYSICAL EXAM: Blood pressure 136/77, pulse 70, temperature 98 F (36.7 C), temperature source Oral, height 5\' 5"  (1.651 m), weight (!) 305 lb (138.3 kg), SpO2 100 %. Body mass index is 50.75 kg/m. Physical Exam Vitals signs reviewed.  Constitutional:      Appearance: Normal appearance. She is well-developed. She is obese.  Cardiovascular:     Rate and Rhythm: Normal rate.  Pulmonary:     Effort: Pulmonary effort is normal.  Musculoskeletal: Normal range of motion.  Skin:    General: Skin is warm and dry.  Neurological:     Mental Status: She is alert and oriented to person, place, and time.  Psychiatric:        Mood and Affect: Mood normal.        Behavior: Behavior normal.     RECENT LABS AND TESTS: BMET    Component Value Date/Time   NA 142 06/02/2019 1356   K 4.1 06/02/2019 1356   CL 103 06/02/2019 1356   CO2 24 06/02/2019 1356   GLUCOSE 101 (H) 06/02/2019 1356   GLUCOSE 99 06/04/2018 0958   BUN 13 06/02/2019 1356   CREATININE 0.80 06/02/2019 1356   CALCIUM 9.3 06/02/2019 1356   GFRNONAA 79 06/02/2019 1356   GFRAA 91 06/02/2019 1356   Lab Results  Component Value Date   HGBA1C 6.4 07/25/2017   Lab Results  Component Value Date   INSULIN 22.0 06/02/2019   CBC    Component Value Date/Time   WBC 4.0 06/02/2019 1356   WBC 6.7 09/09/2016 1502   RBC 4.72 06/02/2019 1356   RBC 4.64 09/09/2016 1502   HGB 14.2 06/02/2019 1356   HCT 42.9 06/02/2019 1356   PLT 217 06/02/2019 1356   MCV 91 06/02/2019 1356   MCH 30.1 06/02/2019 1356   MCHC 33.1 06/02/2019 1356   MCHC 33.4 09/09/2016 1502   RDW 14.5 06/02/2019 1356   LYMPHSABS 1.5  06/02/2019 1356   MONOABS 0.3 09/09/2016 1502   EOSABS 0.1 06/02/2019 1356   BASOSABS 0.0 06/02/2019 1356   Iron/TIBC/Ferritin/ %Sat  No results found for: IRON, TIBC, FERRITIN, IRONPCTSAT Lipid Panel     Component Value Date/Time   CHOL 145 06/02/2019 1356   TRIG 77 06/02/2019 1356   HDL 40 06/02/2019 1356   CHOLHDL 3.6 06/02/2019 1356   CHOLHDL 3 06/04/2018 0958   VLDL 13.2 06/04/2018 0958   LDLCALC 90 06/02/2019 1356   LDLDIRECT 179.1 09/16/2012 0925   Hepatic Function Panel     Component Value Date/Time   PROT 7.3 06/02/2019 1356   ALBUMIN 4.2 06/02/2019 1356   AST 20 06/02/2019 1356   ALT 26 06/02/2019 1356   ALKPHOS 103 06/02/2019 1356   BILITOT 0.4 06/02/2019 1356   BILIDIR 0.2 04/13/2015 1357      Component Value Date/Time   TSH 0.500 06/02/2019 1356   TSH 1.42 06/04/2018 0958   TSH 1.186 10/10/2015 1523     Ref. Range 06/02/2019 13:56  Vitamin D, 25-Hydroxy Latest Ref Range: 30.0 - 100.0 ng/mL 6.3 (L)    OBESITY BEHAVIORAL INTERVENTION VISIT  Today's visit was # 5   Starting weight: 308 lbs Starting date: 06/02/2019 Today's weight : 305 lbs Today's date: 08/24/2019 Total lbs lost to date: 3    08/24/2019  Height 5\' 5"  (1.651 m)  Weight 305 lb (138.3 kg) (A)  BMI (Calculated) 50.75  BLOOD PRESSURE - SYSTOLIC [id number]  BLOOD PRESSURE - DIASTOLIC 77   Body Fat % [id number] %    ASK: We discussed the diagnosis of obesity with Tracey Page today and Tracey Page agreed to give [place] permission to discuss obesity behavioral modification therapy today.  ASSESS: Tracey Page has the diagnosis of obesity and her BMI today is 50.75 Tracey Page is in the action stage of change   ADVISE: Francella was educated on the multiple health risks of obesity as well as the benefit of weight loss to improve her health. She was advised of the need for long term treatment and the importance of lifestyle modifications to improve her current health and to decrease her risk of future health problems.   AGREE: Multiple dietary modification options and treatment options were discussed and  Audrea agreed to follow the recommendations documented in the above note.  ARRANGE: Kalisi was educated on the importance of frequent visits to treat obesity as outlined per CMS and USPSTF guidelines and agreed to schedule her next follow up appointment today.  I, Doreene Nest, am acting as transcriptionist for [employer], FNP-C  I have reviewed the above documentation for accuracy and completeness, and I agree with the above.  - Bralynn Velador, FNP-C.

## 2019-09-15 ENCOUNTER — Ambulatory Visit (INDEPENDENT_AMBULATORY_CARE_PROVIDER_SITE_OTHER): Payer: 59 | Admitting: Family Medicine

## 2019-09-23 ENCOUNTER — Ambulatory Visit (INDEPENDENT_AMBULATORY_CARE_PROVIDER_SITE_OTHER): Payer: 59 | Admitting: Family Medicine

## 2019-09-23 ENCOUNTER — Other Ambulatory Visit: Payer: Self-pay

## 2019-09-23 ENCOUNTER — Other Ambulatory Visit: Payer: Self-pay | Admitting: Family Medicine

## 2019-09-23 ENCOUNTER — Encounter (INDEPENDENT_AMBULATORY_CARE_PROVIDER_SITE_OTHER): Payer: Self-pay | Admitting: Family Medicine

## 2019-09-23 VITALS — BP 159/75 | HR 76 | Temp 97.5°F | Ht 65.0 in | Wt 304.0 lb

## 2019-09-23 DIAGNOSIS — I10 Essential (primary) hypertension: Secondary | ICD-10-CM | POA: Diagnosis not present

## 2019-09-23 DIAGNOSIS — E559 Vitamin D deficiency, unspecified: Secondary | ICD-10-CM

## 2019-09-23 DIAGNOSIS — Z9189 Other specified personal risk factors, not elsewhere classified: Secondary | ICD-10-CM | POA: Diagnosis not present

## 2019-09-23 DIAGNOSIS — Z6841 Body Mass Index (BMI) 40.0 and over, adult: Secondary | ICD-10-CM

## 2019-09-23 MED ORDER — VITAMIN D (ERGOCALCIFEROL) 1.25 MG (50000 UNIT) PO CAPS: 50000.0000 [IU] | ORAL_CAPSULE | ORAL | 0 refills | Status: DC

## 2019-09-23 NOTE — Telephone Encounter (Signed)
Last OV 03/05/19 Last refill Metoprolol 03/29/19 #270/1                 Amlodipine 06/08/19 # 90/1 Next OV not scheduled

## 2019-09-27 NOTE — Progress Notes (Signed)
Office: [phone]  /  Fax: [phone]   HPI:  Chief Complaint: OBESITY Tracey Page is here to discuss her progress with her obesity treatment plan. She is on the Pescatarian plan and states she is following her eating plan approximately 70 % of the time. She states she is exercising 0 minutes 0 times per week.  Tracey Page has managed to lose weight in December which is great. She is doing okay on her plan but thinks she will do well on a lower carb plan.   Today's visit was # 6  Starting weight: 308 lbs Starting date: 06/02/19 Today's weight : Weight: (!) 304 lb (137.9 kg)  Today's date: 09/23/19 Total lbs lost to date: 4 lbs Total lbs lost since last in-office visit: 1 lb  Hypertension  Tracey Page has a diagnosis of HTN. Her blood pressure is increased today. She feels she is retaining some fluid today. She has not taken her blood pressure medication this morning and she notes increased stress recently.   At risk for cardiovascular disease Tracey Page is at a higher than average risk for cardiovascular disease due to obesity. She currently denies any chest pain.  Vitamin D Deficiency  Tracey Page has vit D deficiency. She was started on vit D prescription. She denies nausea, vomiting, and muscle weakness. She requests a refill today.   ASSESSMENT AND PLAN:  Essential hypertension  Vitamin D deficiency - Plan: Vitamin D, Ergocalciferol, (DRISDOL) 1.25 MG (50000 UT) CAPS capsule  At risk for heart disease  Class 3 severe obesity with serious comorbidity and body mass index (BMI) of 50.0 to 59.9 in adult, unspecified obesity type ([place])  PLAN: Hypertension Minnah is working on healthy weight loss and exercise to improve blood pressure control. We will watch for signs of hypotension as she continues her lifestyle modifications. She will recheck blood pressure at home.   Cardiovascular risk counseling Abbie was given (~15 minutes) coronary artery disease prevention counseling today. She is 63 y.o.  female and has risk factors for heart disease including obesity. We discussed intensive lifestyle modifications today with an emphasis on specific weight loss instructions and strategies.   Vitamin D Deficiency Low vitamin D level contributes to fatigue and are associated with obesity, breast, and colon cancer. She agrees to continue to take prescription Vit D @50 ,000 IU every week #4 with no refills and will follow up for routine testing of vitamin D, at least 2-3 times per year to avoid over-replacement.  Obesity Tracey Page is currently in the action stage of change. As such, her goal is to continue with weight loss efforts She has agreed to follow a lower carbohydrate, vegetable and lean protein rich diet plan Tracey Page will work up to a goal of 150 minutes of combined cardio and strengthening exercise per week for weight loss and overall health benefits. We discussed the following Behavioral Modification Strategies today:increasing water intake, increasing lean protein intake and holiday eating strategies    Tracey Page has agreed to follow up with our clinic in 3-4 weeks. She was informed of the importance of frequent follow up visits to maximize her success with intensive lifestyle modifications for her multiple health conditions.  ALLERGIES: Allergies  Allergen Reactions  . Diovan [Valsartan] Swelling and Other (See Comments)    angioedema  . Iodine Swelling  . Lisinopril Swelling  . Shellfish Allergy Swelling    Swelling of tongue  . Sulfonamide Derivatives Swelling    Swelling in hand/knees    MEDICATIONS: Current Outpatient Medications on File Prior to  Visit  Medication Sig Dispense Refill  . ALPRAZolam (XANAX) 0.25 MG tablet TAKE ONE TABLET BY MOUTH TWICE DAILY AS NEEDED FOR ANXIETY 45 tablet 0  . atorvastatin (LIPITOR) 20 MG tablet Take 1 tablet by mouth once daily 90 tablet 1  . azelastine (ASTELIN) 0.1 % nasal spray Place 1 spray into both nostrils 2 (two) times daily. Use in each  nostril as directed 30 mL 12  . EPINEPHrine 0.3 mg/0.3 mL IJ SOAJ injection INJECT CONTENTS OF 1 PEN AS NEEDED FOR ALLERGIC REACTION    . fluticasone (FLONASE) 50 MCG/ACT nasal spray Place 2 sprays into both nostrils daily. 16 g 6  . furosemide (LASIX) 20 MG tablet TAKE 1 TO 2 TABLETS BY MOUTH ONCE DAILY FOR EDEMA . APPOINTMENT REQUIRED FOR FUTURE REFILLS 60 tablet 2  . levocetirizine (XYZAL) 5 MG tablet Take 1 tablet (5 mg total) by mouth every evening. 30 tablet 5  . NONFORMULARY OR COMPOUNDED ITEM Compression socks  30-40 mmhg  #1  As directed 1 each 0  . pantoprazole (PROTONIX) 20 MG tablet Take 1 tablet (20 mg total) by mouth daily. 90 tablet 1  . Semaglutide (RYBELSUS) 7 MG TABS Take 7 mg by mouth daily.    Marland Kitchen SYNTHROID 137 MCG tablet Take 1 tablet by mouth every morning.     No current facility-administered medications on file prior to visit.    PAST MEDICAL HISTORY: Past Medical History:  Diagnosis Date  . Anxiety   . Bilateral swelling of feet and ankles   . Diverticulitis   . Food allergy   . Hyperlipidemia   . Hypertension   . Hypothyroidism   . Lymphedema   . Obesity   . Pre-diabetes   . Thyroid disease     PAST SURGICAL HISTORY: Past Surgical History:  Procedure Laterality Date  . COLONOSCOPY WITH PROPOFOL N/A 12/17/2018   Procedure: COLONOSCOPY WITH PROPOFOL;  Surgeon: Milus Banister, MD;  Location: [facility];  Service: Endoscopy;  Laterality: N/A;  . ECTOPIC PREGNANCY SURGERY    . POLYPECTOMY  12/17/2018   Procedure: POLYPECTOMY;  Surgeon: Milus Banister, MD;  Location: [facility];  Service: Endoscopy;;    SOCIAL HISTORY: Social History   Tobacco Use  . Smoking status: Former Research scientist (life sciences)  . Smokeless tobacco: Never Used  Substance Use Topics  . Alcohol use: Yes    Comment: socially, not even on weekly basis  . Drug use: No    FAMILY HISTORY: Family History  Problem Relation Age of Onset  . Arthritis Mother   . Hypertension Mother   . Heart  disease Mother   . Anxiety disorder Mother   . Thyroid disease Mother   . Obesity Mother   . Aneurysm Father   . Sudden death Father   . Hyperlipidemia Other   . Hypertension Other   . Diabetes Maternal Grandfather   . Coronary artery disease Other     ROS: Review of Systems  Constitutional: Positive for weight loss.  Gastrointestinal: Negative for nausea and vomiting.  Musculoskeletal:       Negative for muscle weakness    PHYSICAL EXAM: Blood pressure (!) 159/75, pulse 76, temperature (!) 97.5 F (36.4 C), temperature source Oral, height 5\' 5"  (1.651 m), weight (!) 304 lb (137.9 kg), SpO2 100 %. Body mass index is 50.59 kg/m. Physical Exam Vitals reviewed.  Constitutional:      Appearance: Normal appearance.  HENT:     Head: Normocephalic.  Cardiovascular:     Rate  and Rhythm: Normal rate.  Pulmonary:     Effort: Pulmonary effort is normal.  Musculoskeletal:        General: Normal range of motion.  Skin:    General: Skin is warm and dry.  Neurological:     Mental Status: She is alert and oriented to person, place, and time.  Psychiatric:        Mood and Affect: Mood normal.        Behavior: Behavior normal.     RECENT LABS AND TESTS: BMET    Component Value Date/Time   NA 142 06/02/2019 1356   K 4.1 06/02/2019 1356   CL 103 06/02/2019 1356   CO2 24 06/02/2019 1356   GLUCOSE 101 (H) 06/02/2019 1356   GLUCOSE 99 06/04/2018 0958   BUN 13 06/02/2019 1356   CREATININE 0.80 06/02/2019 1356   CALCIUM 9.3 06/02/2019 1356   GFRNONAA 79 06/02/2019 1356   GFRAA 91 06/02/2019 1356   Lab Results  Component Value Date   HGBA1C 6.4 07/25/2017   Lab Results  Component Value Date   INSULIN 22.0 06/02/2019   CBC    Component Value Date/Time   WBC 4.0 06/02/2019 1356   WBC 6.7 09/09/2016 1502   RBC 4.72 06/02/2019 1356   RBC 4.64 09/09/2016 1502   HGB 14.2 06/02/2019 1356   HCT 42.9 06/02/2019 1356   PLT 217 06/02/2019 1356   MCV 91 06/02/2019 1356    MCH 30.1 06/02/2019 1356   MCHC 33.1 06/02/2019 1356   MCHC 33.4 09/09/2016 1502   RDW 14.5 06/02/2019 1356   LYMPHSABS 1.5 06/02/2019 1356   MONOABS 0.3 09/09/2016 1502   EOSABS 0.1 06/02/2019 1356   BASOSABS 0.0 06/02/2019 1356   Iron/TIBC/Ferritin/ %Sat No results found for: IRON, TIBC, FERRITIN, IRONPCTSAT Lipid Panel     Component Value Date/Time   CHOL 145 06/02/2019 1356   TRIG 77 06/02/2019 1356   HDL 40 06/02/2019 1356   CHOLHDL 3.6 06/02/2019 1356   CHOLHDL 3 06/04/2018 0958   VLDL 13.2 06/04/2018 0958   LDLCALC 90 06/02/2019 1356   LDLDIRECT 179.1 09/16/2012 0925   Hepatic Function Panel     Component Value Date/Time   PROT 7.3 06/02/2019 1356   ALBUMIN 4.2 06/02/2019 1356   AST 20 06/02/2019 1356   ALT 26 06/02/2019 1356   ALKPHOS 103 06/02/2019 1356   BILITOT 0.4 06/02/2019 1356   BILIDIR 0.2 04/13/2015 1357      Component Value Date/Time   TSH 0.500 06/02/2019 1356   TSH 1.42 06/04/2018 0958   TSH 1.186 10/10/2015 1523      I, Renee Ramus, am acting as Location manager for Dennard Nip, MD  I have reviewed the above documentation for accuracy and completeness, and I agree with the above. -Dennard Nip, MD

## 2019-10-20 ENCOUNTER — Other Ambulatory Visit (INDEPENDENT_AMBULATORY_CARE_PROVIDER_SITE_OTHER): Payer: Self-pay | Admitting: Family Medicine

## 2019-10-20 DIAGNOSIS — E559 Vitamin D deficiency, unspecified: Secondary | ICD-10-CM

## 2019-10-25 ENCOUNTER — Other Ambulatory Visit: Payer: Self-pay

## 2019-10-25 ENCOUNTER — Ambulatory Visit (INDEPENDENT_AMBULATORY_CARE_PROVIDER_SITE_OTHER): Payer: Managed Care, Other (non HMO) | Admitting: Family Medicine

## 2019-10-25 ENCOUNTER — Encounter (INDEPENDENT_AMBULATORY_CARE_PROVIDER_SITE_OTHER): Payer: Self-pay | Admitting: Family Medicine

## 2019-10-25 VITALS — BP 141/84 | HR 75 | Temp 97.6°F | Ht 65.0 in | Wt 304.0 lb

## 2019-10-25 DIAGNOSIS — Z6841 Body Mass Index (BMI) 40.0 and over, adult: Secondary | ICD-10-CM

## 2019-10-25 DIAGNOSIS — E559 Vitamin D deficiency, unspecified: Secondary | ICD-10-CM

## 2019-10-25 DIAGNOSIS — I10 Essential (primary) hypertension: Secondary | ICD-10-CM | POA: Diagnosis not present

## 2019-10-25 DIAGNOSIS — Z9189 Other specified personal risk factors, not elsewhere classified: Secondary | ICD-10-CM

## 2019-10-25 MED ORDER — VITAMIN D (ERGOCALCIFEROL) 1.25 MG (50000 UNIT) PO CAPS: 50000.0000 [IU] | ORAL_CAPSULE | ORAL | 0 refills | Status: DC

## 2019-10-26 NOTE — Progress Notes (Signed)
Chief Complaint:   OBESITY Tracey Page is here to discuss her progress with her obesity treatment plan along with follow-up of her obesity related diagnoses. Chyler is on following a lower carbohydrate, vegetable and lean protein rich diet plan and states she is following her eating plan approximately 70% of the time. Tracey Page states she is doing 0 minutes 0 times per week.  Today's visit was #: 7 Starting weight: 308 lbs Starting date: 06/02/2019 Today's weight: 304 lbs Today's date: 10/25/2019 Total lbs lost to date: 4 Total lbs lost since last in-office visit: 0  Interim History: Tracey Page did well maintaining her weight over the holidays. She is ready to get back to weight loss, and she is changing to our Low Carbohydrate plan.  Subjective:   1. Vitamin D deficiency Tracey Page last Vit D level was not at goal. She is stable on Vit D.  2. Essential hypertension Tracey Page blood pressure is borderline elevated. She has been working on diet and weight loss. She is checking her blood pressure at home.  3. At risk for heart disease Tracey Page is at a higher than average risk for cardiovascular disease due to obesity. Reviewed: no chest pain on exertion, no dyspnea on exertion, and no swelling of ankles.  Assessment/Plan:   1. Vitamin D deficiency Low Vitamin D level contributes to fatigue and are associated with obesity, breast, and colon cancer. We will refill prescription Vit D for 1 month. We will recheck labs in 1 month. She will follow-up for routine testing of Vitamin D, at least 2-3 times per year to avoid over-replacement. We will continue to monitor.  - Vitamin D, Ergocalciferol, (DRISDOL) 1.25 MG (50000 UNIT) CAPS capsule; Take 1 capsule (50,000 Units total) by mouth every 7 (seven) days.  Dispense: 4 capsule; Refill: 0  2. Essential hypertension Tracey Page is working on healthy weight loss, diet, and exercise to improve blood pressure control. We will watch for signs of hypotension as she  continues her lifestyle modifications. We will continue to monitor.  3. At risk for heart disease Tracey Page was given approximately 15 minutes of coronary artery disease prevention counseling today. She is 64 y.o. female and has risk factors for heart disease including obesity. We discussed intensive lifestyle modifications today with an emphasis on specific weight loss instructions and strategies.   Repetitive spaced learning was employed today to elicit superior memory formation and behavioral change.  4. Class 3 severe obesity with serious comorbidity and body mass index (BMI) of 50.0 to 59.9 in adult, unspecified obesity type ([place]) Tracey Page is currently in the action stage of change. As such, her goal is to continue with weight loss efforts. She has agreed to the [employer] or following a lower carbohydrate, vegetable and lean protein rich diet plan.   Behavioral modification strategies: increasing lean protein intake and keeping healthy foods in the home.  Tracey Page has agreed to follow-up with our clinic in 3 weeks with myself or [employer], FNP-C. She was informed of the importance of frequent follow-up visits to maximize her success with intensive lifestyle modifications for her multiple health conditions.   Objective:   Blood pressure (!) 141/84, pulse 75, temperature 97.6 F (36.4 C), temperature source Oral, height 5\' 5"  (1.651 m), weight (!) 304 lb (137.9 kg), SpO2 100 %. Body mass index is 50.59 kg/m.  General: Cooperative, alert, well developed, in no acute distress. HEENT: Conjunctivae and lids unremarkable. Cardiovascular: Regular rhythm.  Lungs: Normal work of breathing. Neurologic: No focal deficits.  Lab Results  Component Value Date   CREATININE 0.80 06/02/2019   BUN 13 06/02/2019   NA 142 06/02/2019   K 4.1 06/02/2019   CL 103 06/02/2019   CO2 24 06/02/2019   Lab Results  Component Value Date   ALT 26 06/02/2019   AST 20 06/02/2019   ALKPHOS 103 06/02/2019     BILITOT 0.4 06/02/2019   Lab Results  Component Value Date   HGBA1C 6.4 07/25/2017   Lab Results  Component Value Date   INSULIN 22.0 06/02/2019   Lab Results  Component Value Date   TSH 0.500 06/02/2019   Lab Results  Component Value Date   CHOL 145 06/02/2019   HDL 40 06/02/2019   LDLCALC 90 06/02/2019   LDLDIRECT 179.1 09/16/2012   TRIG 77 06/02/2019   CHOLHDL 3.6 06/02/2019   Lab Results  Component Value Date   WBC 4.0 06/02/2019   HGB 14.2 06/02/2019   HCT 42.9 06/02/2019   MCV 91 06/02/2019   PLT 217 06/02/2019   No results found for: IRON, TIBC, FERRITIN  Attestation Statements:   Reviewed by clinician on day of visit: allergies, medications, problem list, medical history, surgical history, family history, social history, and previous encounter notes.   I, Trixie Dredge, am acting as transcriptionist for Dennard Nip, MD.  I have reviewed the above documentation for accuracy and completeness, and I agree with the above. -  Dennard Nip, MD

## 2019-11-15 ENCOUNTER — Ambulatory Visit (INDEPENDENT_AMBULATORY_CARE_PROVIDER_SITE_OTHER): Payer: Managed Care, Other (non HMO) | Admitting: Family Medicine

## 2019-11-24 ENCOUNTER — Ambulatory Visit (INDEPENDENT_AMBULATORY_CARE_PROVIDER_SITE_OTHER): Payer: Managed Care, Other (non HMO) | Admitting: Family Medicine

## 2019-11-24 ENCOUNTER — Encounter (INDEPENDENT_AMBULATORY_CARE_PROVIDER_SITE_OTHER): Payer: Self-pay | Admitting: Family Medicine

## 2019-11-24 ENCOUNTER — Other Ambulatory Visit: Payer: Self-pay

## 2019-11-24 VITALS — BP 126/79 | HR 75 | Temp 98.2°F | Ht 65.0 in | Wt 307.0 lb

## 2019-11-24 DIAGNOSIS — G4709 Other insomnia: Secondary | ICD-10-CM

## 2019-11-24 DIAGNOSIS — Z6841 Body Mass Index (BMI) 40.0 and over, adult: Secondary | ICD-10-CM | POA: Diagnosis not present

## 2019-11-24 DIAGNOSIS — E559 Vitamin D deficiency, unspecified: Secondary | ICD-10-CM | POA: Diagnosis not present

## 2019-11-24 DIAGNOSIS — I10 Essential (primary) hypertension: Secondary | ICD-10-CM

## 2019-11-24 MED ORDER — TRAZODONE HCL 50 MG PO TABS: 25.0000 mg | ORAL_TABLET | Freq: Every evening | ORAL | 0 refills | Status: DC | PRN

## 2019-11-24 MED ORDER — VITAMIN D (ERGOCALCIFEROL) 1.25 MG (50000 UNIT) PO CAPS: 50000.0000 [IU] | ORAL_CAPSULE | ORAL | 0 refills | Status: DC

## 2019-11-24 MED ORDER — AMLODIPINE BESYLATE 10 MG PO TABS: 10.0000 mg | ORAL_TABLET | Freq: Every day | ORAL | 0 refills | Status: DC

## 2019-11-24 NOTE — Progress Notes (Signed)
Chief Complaint:   OBESITY Tracey Page is here to discuss her progress with her obesity treatment plan along with follow-up of her obesity related diagnoses. Tracey Page is on the [place] or following a lower carbohydrate, vegetable and lean protein rich diet plan and states she is following her eating plan approximately 70% of the time. Tracey Page states she is walking and bike riding for 15 minutes 2 times per week.  Today's visit was #: 8 Starting weight: 308 lbs Starting date: 06/02/2019 Today's weight: 307 lbs Today's date: 11/24/2019 Total lbs lost to date: 1 Total lbs lost since last in-office visit: 0  Interim History: Tracey Page has had a lot of stress recently with sick family members and deaths, and she hasn't concentrated on weight loss. She notes increased emotional eating and decreased sleep with increased fatigue.  Subjective:   1. Other insomnia Tracey Page has a new diagnosis of insomnia. She is only sleeping 4-5 hours per night. She struggles to fall asleep and stay sleep.  2. Essential hypertension Tracey Page's blood pressure is stable on her medications. She denies chest pain or headaches. She requests a refill today.  3. Vitamin D deficiency Tracey Page is stable on Vit D and she denies nausea, vomiting, or muscle weakness.  Assessment/Plan:   1. Other insomnia The problem of recurrent insomnia was discussed. Orders and follow up as documented in patient record. Counseling: Intensive lifestyle modifications are the first line treatment for this issue. We discussed several lifestyle modifications today and she will continue to work on diet, exercise and weight loss efforts. Tai agreed to start trazodone 25 mg qhs with no refills, and will follow up as directed.  - traZODone (DESYREL) 50 MG tablet; Take 0.5 tablets (25 mg total) by mouth at bedtime as needed for sleep.  Dispense: 30 tablet; Refill: 0  2. Essential hypertension Amanada is working on healthy weight loss and exercise to  improve blood pressure control. We will watch for signs of hypotension as she continues her lifestyle modifications. We will refill amlodipine for 1 month, and we will check labs today.  - Comprehensive metabolic panel - Hemoglobin A1c - Insulin, random - Lipid Panel With LDL/HDL Ratio - amLODipine (NORVASC) 10 MG tablet; Take 1 tablet (10 mg total) by mouth daily.  Dispense: 90 tablet; Refill: 0  3. Vitamin D deficiency Low Vitamin D level contributes to fatigue and are associated with obesity, breast, and colon cancer. We will refill prescription Vitamin D for 1 month, and we will check labs today. Tracey Page will follow-up for routine testing of Vitamin D, at least 2-3 times per year to avoid over-replacement.  - VITAMIN D 25 Hydroxy (Vit-D Deficiency, Fractures) - Vitamin D, Ergocalciferol, (DRISDOL) 1.25 MG (50000 UNIT) CAPS capsule; Take 1 capsule (50,000 Units total) by mouth every 7 (seven) days.  Dispense: 4 capsule; Refill: 0  4. Class 3 severe obesity with serious comorbidity and body mass index (BMI) of 50.0 to 59.9 in adult, unspecified obesity type ([place]) Tracey Page is currently in the action stage of change. As such, her goal is to maintain weight for now. She has agreed to the [employer].   Tracey Page is to work on maintaining her weight while she deals with her stress.  Exercise goals: Tracey Page is to continue her current exercise regimen as is.  Behavioral modification strategies: increasing lean protein intake and meal planning and cooking strategies.  Tracey Page has agreed to follow-up with our clinic in 3 to 4 weeks. She was informed of the  importance of frequent follow-up visits to maximize her success with intensive lifestyle modifications for her multiple health conditions.   Tracey Page was informed we would discuss her lab results at her next visit unless there is a critical issue that needs to be addressed sooner. Tracey Page agreed to keep her next visit at the agreed upon time to discuss these  results.  Objective:   Blood pressure 126/79, pulse 75, temperature 98.2 F (36.8 C), temperature source Oral, height 5\' 5"  (1.651 m), weight (!) 307 lb (139.3 kg), SpO2 100 %. Body mass index is 51.09 kg/m.  General: Cooperative, alert, well developed, in no acute distress. HEENT: Conjunctivae and lids unremarkable. Cardiovascular: Regular rhythm.  Lungs: Normal work of breathing. Neurologic: No focal deficits.   Lab Results  Component Value Date   CREATININE 0.80 06/02/2019   BUN 13 06/02/2019   NA 142 06/02/2019   K 4.1 06/02/2019   CL 103 06/02/2019   CO2 24 06/02/2019   Lab Results  Component Value Date   ALT 26 06/02/2019   AST 20 06/02/2019   ALKPHOS 103 06/02/2019   BILITOT 0.4 06/02/2019   Lab Results  Component Value Date   HGBA1C 6.4 07/25/2017   Lab Results  Component Value Date   INSULIN 22.0 06/02/2019   Lab Results  Component Value Date   TSH 0.500 06/02/2019   Lab Results  Component Value Date   CHOL 145 06/02/2019   HDL 40 06/02/2019   LDLCALC 90 06/02/2019   LDLDIRECT 179.1 09/16/2012   TRIG 77 06/02/2019   CHOLHDL 3.6 06/02/2019   Lab Results  Component Value Date   WBC 4.0 06/02/2019   HGB 14.2 06/02/2019   HCT 42.9 06/02/2019   MCV 91 06/02/2019   PLT 217 06/02/2019   No results found for: IRON, TIBC, FERRITIN  Attestation Statements:   Reviewed by clinician on day of visit: allergies, medications, problem list, medical history, surgical history, family history, social history, and previous encounter notes.   I, Trixie Dredge, am acting as transcriptionist for Dennard Nip, MD.  I have reviewed the above documentation for accuracy and completeness, and I agree with the above. -  Dennard Nip, MD

## 2019-11-25 LAB — INSULIN, RANDOM: INSULIN: 31.1 u[IU]/mL — ABNORMAL HIGH (ref 2.6–24.9)

## 2019-11-25 LAB — LIPID PANEL WITH LDL/HDL RATIO
Cholesterol, Total: 152 mg/dL (ref 100–199)
HDL: 42 mg/dL (ref >39)
LDL Chol Calc (NIH): 96 mg/dL (ref 0–99)
LDL/HDL Ratio: 2.3 ratio (ref 0.0–3.2)
Triglycerides: 70 mg/dL (ref 0–149)
VLDL Cholesterol Cal: 14 mg/dL (ref 5–40)

## 2019-11-25 LAB — COMPREHENSIVE METABOLIC PANEL
ALT: 26 IU/L (ref 0–32)
AST: 18 IU/L (ref 0–40)
Albumin/Globulin Ratio: 1.3 (ref 1.2–2.2)
Albumin: 3.9 g/dL (ref 3.8–4.8)
Alkaline Phosphatase: 102 IU/L (ref 39–117)
BUN/Creatinine Ratio: 14 (ref 12–28)
BUN: 11 mg/dL (ref 8–27)
Bilirubin Total: 0.2 mg/dL (ref 0.0–1.2)
CO2: 22 mmol/L (ref 20–29)
Calcium: 9.1 mg/dL (ref 8.7–10.3)
Chloride: 103 mmol/L (ref 96–106)
Creatinine, Ser: 0.77 mg/dL (ref 0.57–1.00)
GFR calc Af Amer: 95 mL/min/{1.73_m2} (ref >59)
GFR calc non Af Amer: 82 mL/min/{1.73_m2} (ref >59)
Globulin, Total: 2.9 g/dL (ref 1.5–4.5)
Glucose: 119 mg/dL — ABNORMAL HIGH (ref 65–99)
Potassium: 3.8 mmol/L (ref 3.5–5.2)
Sodium: 142 mmol/L (ref 134–144)
Total Protein: 6.8 g/dL (ref 6.0–8.5)

## 2019-11-25 LAB — VITAMIN D 25 HYDROXY (VIT D DEFICIENCY, FRACTURES): Vit D, 25-Hydroxy: 34.6 ng/mL (ref 30.0–100.0)

## 2019-11-25 LAB — HEMOGLOBIN A1C
Est. average glucose Bld gHb Est-mCnc: 128 mg/dL
Hgb A1c MFr Bld: 6.1 % — ABNORMAL HIGH (ref 4.8–5.6)

## 2019-12-24 ENCOUNTER — Other Ambulatory Visit: Payer: Self-pay | Admitting: Family Medicine

## 2019-12-24 DIAGNOSIS — I10 Essential (primary) hypertension: Secondary | ICD-10-CM

## 2019-12-24 DIAGNOSIS — R601 Generalized edema: Secondary | ICD-10-CM

## 2019-12-26 ENCOUNTER — Other Ambulatory Visit: Payer: Self-pay | Admitting: Family Medicine

## 2019-12-26 DIAGNOSIS — I10 Essential (primary) hypertension: Secondary | ICD-10-CM

## 2019-12-26 DIAGNOSIS — R601 Generalized edema: Secondary | ICD-10-CM

## 2019-12-26 NOTE — Telephone Encounter (Signed)
No follow up given at last ov which was May of last year.  Do you want to refill or follow up?

## 2019-12-27 ENCOUNTER — Encounter (INDEPENDENT_AMBULATORY_CARE_PROVIDER_SITE_OTHER): Payer: Self-pay | Admitting: Family Medicine

## 2019-12-27 ENCOUNTER — Other Ambulatory Visit: Payer: Self-pay

## 2019-12-27 ENCOUNTER — Ambulatory Visit (INDEPENDENT_AMBULATORY_CARE_PROVIDER_SITE_OTHER): Payer: 59 | Admitting: Family Medicine

## 2019-12-27 VITALS — BP 126/67 | HR 78 | Temp 98.2°F | Ht 65.0 in | Wt 304.0 lb

## 2019-12-27 DIAGNOSIS — I10 Essential (primary) hypertension: Secondary | ICD-10-CM

## 2019-12-27 DIAGNOSIS — Z6841 Body Mass Index (BMI) 40.0 and over, adult: Secondary | ICD-10-CM

## 2019-12-27 DIAGNOSIS — E119 Type 2 diabetes mellitus without complications: Secondary | ICD-10-CM | POA: Diagnosis not present

## 2019-12-27 DIAGNOSIS — E559 Vitamin D deficiency, unspecified: Secondary | ICD-10-CM

## 2019-12-27 DIAGNOSIS — E039 Hypothyroidism, unspecified: Secondary | ICD-10-CM

## 2019-12-27 DIAGNOSIS — E1159 Type 2 diabetes mellitus with other circulatory complications: Secondary | ICD-10-CM

## 2019-12-27 MED ORDER — VITAMIN D (ERGOCALCIFEROL) 1.25 MG (50000 UNIT) PO CAPS: 50000.0000 [IU] | ORAL_CAPSULE | ORAL | 0 refills | Status: DC

## 2019-12-27 NOTE — Progress Notes (Signed)
Chief Complaint:   OBESITY Tracey Page is here to discuss her progress with her obesity treatment plan along with follow-up of her obesity related diagnoses. Tracey Page is on the [employer] and states she is following her eating plan approximately 70% of the time. Tracey Page states she is bike riding for 15 minutes 2 times per week.  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 She has received her first COVID-19 vaccine, and her second dose is January 11, 2020. She is looking forward to her annual trip with her college friends once she is fully vaccinated. Her goal is to weigh <300 lbs, current weight is 304 lbs. She has stopped eating meals out.  Subjective:   1. Vitamin D deficiency Tracey Page is currently on prescription supplementation.  2. Hypertension associated with type 2 diabetes mellitus (Tracey Page) Tracey Page is currently on amlodipine 10 mg q daily, metoprolol XL 50 mg 3 tablets q daily, and furosemide as needed for edema. Her blood pressure is at goal today.  3. Hypothyroidism, unspecified type Tracey Page is currently on levothyroxine 137 mcg q daily. Last TSH was 0.500 on 06/02/2019.  4. Type 2 diabetes mellitus without complication, without long-term current use of insulin (Tracey Page) Tracey Page reports she has resumed oral GLP-1 Rybelsus, and she is tolerating it well. Her last A1c was 6.1 on 11/24/2019.  Assessment/Plan:   1. Vitamin D deficiency Low Vitamin D level contributes to fatigue and are associated with obesity, breast, and colon cancer. We will refill prescription Vitamin D for 1 month. Tracey Page will follow-up for routine testing of Vitamin D, at least 2-3 times per year to avoid over-replacement.  - Vitamin D, Ergocalciferol, (DRISDOL)  1.25 MG (50000 UNIT) CAPS capsule; Take 1 capsule (50,000 Units total) by mouth every 7 (seven) days.  Dispense: 4 capsule; Refill: 0  2. Hypertension associated with type 2 diabetes mellitus (Tracey Page) Nataline is working on healthy weight loss and exercise to improve blood pressure control. We will watch for signs of hypotension as she continues her lifestyle modifications. Tracey Page agreed to continue her current anti-hypertensive therapy.  3. Hypothyroidism, unspecified type Patient with long-standing hypothyroidism, and Tracey Page agreed to continue her current Synthroid dosage. She appears euthyroid. Orders and follow up as documented in patient record.  4. Type 2 diabetes mellitus without complication, without long-term current use of insulin (Tracey Page) [name], neuropathy, limb loss, blindness, coronary artery disease, and death. Intensive lifestyle modification including diet, exercise and weight loss are the first line of treatment for diabetes. Tracey Page agreed to continue Rybelsus 7 mg q daily.  5. Class 3 severe obesity with serious comorbidity and body mass index (BMI) of 50.0 to 59.9 in adult, unspecified obesity type (Tracey Page) Tracey Page is currently in the action stage of change. As such, her goal is to continue with weight loss efforts. She has agreed to the [employer].   Exercise goals: As is.  Behavioral modification strategies: increasing lean protein intake and travel eating strategies.  Tracey Page has agreed to follow-up with our clinic in 4 weeks. She was informed of the importance of frequent follow-up visits to maximize her success with intensive lifestyle modifications for her multiple health  conditions.   Objective:   Blood pressure 126/67, pulse 78, temperature 98.2 F (36.8 C), temperature source Oral, height 5\' 5"  (1.651 m), weight (!) 304 lb (137.9 kg), SpO2 100 %. Body mass index is 50.59  kg/m.  General: Cooperative, alert, well developed, in no acute distress. HEENT: Conjunctivae and lids unremarkable. Cardiovascular: Regular rhythm.  Lungs: Normal work of breathing. Neurologic: No focal deficits.   Lab Results  Component Value Date   CREATININE 0.77 11/24/2019   BUN 11 11/24/2019   NA 142 11/24/2019   K 3.8 11/24/2019   CL 103 11/24/2019   CO2 22 11/24/2019   Lab Results  Component Value Date   ALT 26 11/24/2019   AST 18 11/24/2019   ALKPHOS 102 11/24/2019   BILITOT 0.2 11/24/2019   Lab Results  Component Value Date   HGBA1C 6.1 (H) 11/24/2019   HGBA1C 6.4 07/25/2017   Lab Results  Component Value Date   INSULIN 31.1 (H) 11/24/2019   INSULIN 22.0 06/02/2019   Lab Results  Component Value Date   TSH 0.500 06/02/2019   Lab Results  Component Value Date   CHOL 152 11/24/2019   HDL 42 11/24/2019   LDLCALC 96 11/24/2019   LDLDIRECT 179.1 09/16/2012   TRIG 70 11/24/2019   CHOLHDL 3.6 06/02/2019   Lab Results  Component Value Date   WBC 4.0 06/02/2019   HGB 14.2 06/02/2019   HCT 42.9 06/02/2019   MCV 91 06/02/2019   PLT 217 06/02/2019   No results found for: IRON, TIBC, FERRITIN  Attestation Statements:   Reviewed by clinician on day of visit: allergies, medications, problem list, medical history, surgical history, family history, social history, and previous encounter notes.   I, Trixie Dredge, am acting as transcriptionist for Dennard Nip, MD.  I have reviewed the above documentation for accuracy and completeness, and I agree with the above. -  Dennard Nip, MD

## 2019-12-28 ENCOUNTER — Ambulatory Visit (INDEPENDENT_AMBULATORY_CARE_PROVIDER_SITE_OTHER): Payer: 59 | Admitting: Family Medicine

## 2019-12-28 ENCOUNTER — Encounter: Payer: Self-pay | Admitting: Family Medicine

## 2019-12-28 ENCOUNTER — Other Ambulatory Visit: Payer: Self-pay

## 2019-12-28 VITALS — BP 130/88 | HR 76 | Temp 97.7°F | Resp 18 | Ht 65.0 in | Wt 308.8 lb

## 2019-12-28 DIAGNOSIS — E559 Vitamin D deficiency, unspecified: Secondary | ICD-10-CM | POA: Diagnosis not present

## 2019-12-28 DIAGNOSIS — Z6841 Body Mass Index (BMI) 40.0 and over, adult: Secondary | ICD-10-CM

## 2019-12-28 DIAGNOSIS — R601 Generalized edema: Secondary | ICD-10-CM

## 2019-12-28 DIAGNOSIS — I10 Essential (primary) hypertension: Secondary | ICD-10-CM | POA: Diagnosis not present

## 2019-12-28 DIAGNOSIS — E785 Hyperlipidemia, unspecified: Secondary | ICD-10-CM

## 2019-12-28 MED ORDER — ATORVASTATIN CALCIUM 20 MG PO TABS: 20.0000 mg | ORAL_TABLET | Freq: Every day | ORAL | 1 refills | Status: DC

## 2019-12-28 MED ORDER — FUROSEMIDE 20 MG PO TABS: 20.0000 mg | ORAL_TABLET | Freq: Two times a day (BID) | ORAL | 1 refills | Status: DC

## 2019-12-28 MED ORDER — AMLODIPINE BESYLATE 10 MG PO TABS: 10.0000 mg | ORAL_TABLET | Freq: Every day | ORAL | 1 refills | Status: DC

## 2019-12-28 MED ORDER — METOPROLOL SUCCINATE ER 50 MG PO TB24: ORAL_TABLET | ORAL | 1 refills | Status: DC

## 2019-12-28 NOTE — Assessment & Plan Note (Signed)
[facility] weight and wellness 

## 2019-12-28 NOTE — Progress Notes (Signed)
Patient ID: Tracey Page, female    DOB: March 31, 1956  Age: 64 y.o. MRN: [id number]    Subjective:  Subjective  HPI Mazi BARAA LABARR presents for f/u bp.  Pt is doing well with healthy weight and wellness   Labs recently done   Review of Systems  Constitutional: Negative for appetite change, diaphoresis, fatigue and unexpected weight change.  Eyes: Negative for pain, redness and visual disturbance.  Respiratory: Negative for cough, chest tightness, shortness of breath and wheezing.   Cardiovascular: Negative for chest pain, palpitations and leg swelling.  Endocrine: Negative for cold intolerance, heat intolerance, polydipsia, polyphagia and polyuria.  Genitourinary: Negative for difficulty urinating, dysuria and frequency.  Neurological: Negative for dizziness, light-headedness, numbness and headaches.    History Past Medical History:  Diagnosis Date   Anxiety    Bilateral swelling of feet and ankles    Diverticulitis    Food allergy    Hyperlipidemia    Hypertension    Hypothyroidism    Lymphedema    Obesity    Pre-diabetes    Thyroid disease     She has a past surgical history that includes Ectopic pregnancy surgery; Colonoscopy with propofol (N/A, 12/17/2018); and polypectomy (12/17/2018).   Her family history includes Aneurysm in her father; Anxiety disorder in her mother; Arthritis in her mother; Coronary artery disease in an other family member; Diabetes in her maternal grandfather; Heart disease in her mother; Hyperlipidemia in an other family member; Hypertension in her mother and another family member; Obesity in her mother; Sudden death in her father; Thyroid disease in her mother.She reports that she has quit smoking. She has never used smokeless tobacco. She reports current alcohol use. She reports that she does not use drugs.  Current Outpatient Medications on File Prior to Visit  Medication Sig Dispense Refill   ALPRAZolam (XANAX) 0.25 MG tablet TAKE  ONE TABLET BY MOUTH TWICE DAILY AS NEEDED FOR ANXIETY 45 tablet 0   azelastine (ASTELIN) 0.1 % nasal spray Place 1 spray into both nostrils 2 (two) times daily. Use in each nostril as directed 30 mL 12   EPINEPHrine 0.3 mg/0.3 mL IJ SOAJ injection INJECT CONTENTS OF 1 PEN AS NEEDED FOR ALLERGIC REACTION     fluticasone (FLONASE) 50 MCG/ACT nasal spray Place 2 sprays into both nostrils daily. 16 g 6   levocetirizine (XYZAL) 5 MG tablet Take 1 tablet (5 mg total) by mouth every evening. 30 tablet 5   NONFORMULARY OR COMPOUNDED ITEM Compression socks  30-40 mmhg  #1  As directed 1 each 0   pantoprazole (PROTONIX) 20 MG tablet Take 1 tablet (20 mg total) by mouth daily. 90 tablet 1   Semaglutide (RYBELSUS) 7 MG TABS Take 7 mg by mouth daily.     SYNTHROID 137 MCG tablet Take 1 tablet by mouth every morning.     Vitamin D, Ergocalciferol, (DRISDOL) 1.25 MG (50000 UNIT) CAPS capsule Take 1 capsule (50,000 Units total) by mouth every 7 (seven) days. 4 capsule 0   No current facility-administered medications on file prior to visit.     Objective:  Objective  Physical Exam Vitals and nursing note reviewed.  Constitutional:      Appearance: She is well-developed.  HENT:     Head: Normocephalic and atraumatic.  Eyes:     Conjunctiva/sclera: Conjunctivae normal.  Neck:     Thyroid: No thyromegaly.     Vascular: No carotid bruit or JVD.  Cardiovascular:     Rate and Rhythm:  Normal rate and regular rhythm.     Heart sounds: Normal heart sounds. No murmur.  Pulmonary:     Effort: Pulmonary effort is normal. No respiratory distress.     Breath sounds: Normal breath sounds. No wheezing or rales.  Chest:     Chest wall: No tenderness.  Musculoskeletal:     Cervical back: Normal range of motion and neck supple.  Neurological:     Mental Status: She is alert and oriented to person, place, and time.    BP 130/88 (BP Location: Right Arm, Patient Position: Sitting, Cuff Size: Large)     Pulse 76    Temp 97.7 F (36.5 C) (Temporal)    Resp 18    Ht 5\' 5"  (1.651 m)    Wt (!) 308 lb 12.8 oz (140.1 kg)    SpO2 100%    BMI 51.39 kg/m  Wt Readings from Last 3 Encounters:  12/28/19 (!) 308 lb 12.8 oz (140.1 kg)  12/27/19 (!) 304 lb (137.9 kg)  11/24/19 (!) 307 lb (139.3 kg)     Lab Results  Component Value Date   WBC 4.0 06/02/2019   HGB 14.2 06/02/2019   HCT 42.9 06/02/2019   PLT 217 06/02/2019   GLUCOSE 119 (H) 11/24/2019   CHOL 152 11/24/2019   TRIG 70 11/24/2019   HDL 42 11/24/2019   LDLDIRECT 179.1 09/16/2012   LDLCALC 96 11/24/2019   ALT 26 11/24/2019   AST 18 11/24/2019   NA 142 11/24/2019   K 3.8 11/24/2019   CL 103 11/24/2019   CREATININE 0.77 11/24/2019   BUN 11 11/24/2019   CO2 22 11/24/2019   TSH 0.500 06/02/2019   HGBA1C 6.1 (H) 11/24/2019   MICROALBUR 8.2 (H) 07/14/2015    US Venous Img Lower Unilateral Left  Result Date: 04/13/2019 CLINICAL DATA:  Left lower extremity edema, erythema and pain. EXAM: LEFT LOWER EXTREMITY VENOUS DOPPLER ULTRASOUND TECHNIQUE: Gray-scale sonography with graded compression, as well as color Doppler and duplex ultrasound were performed to evaluate the lower extremity deep venous systems from the level of the common femoral vein and including the common femoral, femoral, profunda femoral, popliteal and calf veins including the posterior tibial, peroneal and gastrocnemius veins when visible. The superficial great saphenous vein was also interrogated. Spectral Doppler was utilized to evaluate flow at rest and with distal augmentation maneuvers in the common femoral, femoral and popliteal veins. COMPARISON:  None. FINDINGS: Contralateral Common Femoral Vein: Respiratory phasicity is normal and symmetric with the symptomatic side. No evidence of thrombus. Normal compressibility. Common Femoral Vein: No evidence of thrombus. Normal compressibility, respiratory phasicity and response to augmentation. Saphenofemoral Junction: No  evidence of thrombus. Normal compressibility and flow on color Doppler imaging. Profunda Femoral Vein: No evidence of thrombus. Normal compressibility and flow on color Doppler imaging. Femoral Vein: No evidence of thrombus. Normal compressibility, respiratory phasicity and response to augmentation. Popliteal Vein: No evidence of thrombus. Normal compressibility, respiratory phasicity and response to augmentation. Calf Veins: Not well visualized. No convincing deep venous thrombosis. Superficial Great Saphenous Vein: No evidence of thrombus. Normal compressibility. Venous Reflux:  None. Other Findings: Diffuse subcutaneous soft tissue edema is noted most evident along the lower leg. IMPRESSION: No evidence of deep venous thrombosis. Electronically Signed   By: Lajean Manes M.D.   On: 04/13/2019 18:35     Assessment & Plan:  Plan  I have changed Ova M. Mckesson's furosemide and atorvastatin. I am also having her maintain her Synthroid, EPINEPHrine, pantoprazole, fluticasone,  levocetirizine, azelastine, NONFORMULARY OR COMPOUNDED ITEM, ALPRAZolam, Rybelsus, Vitamin D (Ergocalciferol), metoprolol succinate, and amLODipine.  Meds ordered this encounter  Medications   metoprolol succinate (TOPROL-XL) 50 MG 24 hr tablet    Sig: TAKE 3 TABLETS BY MOUTH ONCE DAILY IMMEDIATELY  FOLLOWING  A  MEAL    Dispense:  270 tablet    Refill:  1   furosemide (LASIX) 20 MG tablet    Sig: Take 1 tablet (20 mg total) by mouth 2 (two) times daily.    Dispense:  180 tablet    Refill:  1   atorvastatin (LIPITOR) 20 MG tablet    Sig: Take 1 tablet (20 mg total) by mouth daily.    Dispense:  90 tablet    Refill:  1   amLODipine (NORVASC) 10 MG tablet    Sig: Take 1 tablet (10 mg total) by mouth daily.    Dispense:  90 tablet    Refill:  1    Problem List Items Addressed This Visit      Unprioritized   Class 3 severe obesity with serious comorbidity and body mass index (BMI) of 50.0 to 59.9 in adult ([place])     con't healthy weight and wellness       EDEMA   Relevant Medications   furosemide (LASIX) 20 MG tablet   Essential hypertension - Primary    con't healthy weight and wellness con't meds F/u 6 months or sooner prn   Chemistry      Component Value Date/Time   NA 142 11/24/2019 1237   K 3.8 11/24/2019 1237   CL 103 11/24/2019 1237   CO2 22 11/24/2019 1237   BUN 11 11/24/2019 1237   CREATININE 0.77 11/24/2019 1237      Component Value Date/Time   CALCIUM 9.1 11/24/2019 1237   ALKPHOS 102 11/24/2019 1237   AST 18 11/24/2019 1237   ALT 26 11/24/2019 1237   BILITOT 0.2 11/24/2019 1237           Relevant Medications   metoprolol succinate (TOPROL-XL) 50 MG 24 hr tablet   furosemide (LASIX) 20 MG tablet   atorvastatin (LIPITOR) 20 MG tablet   amLODipine (NORVASC) 10 MG tablet   Hyperlipidemia LDL goal <100    Encouraged heart healthy diet, increase exercise, avoid trans fats, consider a krill oil cap daily Lab Results  Component Value Date   CHOL 152 11/24/2019   HDL 42 11/24/2019   LDLCALC 96 11/24/2019   LDLDIRECT 179.1 09/16/2012   TRIG 70 11/24/2019   CHOLHDL 3.6 06/02/2019        Relevant Medications   metoprolol succinate (TOPROL-XL) 50 MG 24 hr tablet   furosemide (LASIX) 20 MG tablet   atorvastatin (LIPITOR) 20 MG tablet   amLODipine (NORVASC) 10 MG tablet   Vitamin D deficiency    .con't vita d and take otc vita d 1000u daily       Other Visit Diagnoses    Hyperlipidemia, unspecified hyperlipidemia type       Relevant Medications   metoprolol succinate (TOPROL-XL) 50 MG 24 hr tablet   furosemide (LASIX) 20 MG tablet   atorvastatin (LIPITOR) 20 MG tablet   amLODipine (NORVASC) 10 MG tablet      Follow-up: Return in about 6 months (around 06/29/2020), or if symptoms worsen or fail to improve, for annual exam, fasting.  Ann Held, DO

## 2019-12-28 NOTE — Assessment & Plan Note (Signed)
con't healthy weight and wellness con't meds F/u 6 months or sooner prn   Chemistry      Component Value Date/Time   NA 142 11/24/2019 1237   K 3.8 11/24/2019 1237   CL 103 11/24/2019 1237   CO2 22 11/24/2019 1237   BUN 11 11/24/2019 1237   CREATININE 0.77 11/24/2019 1237      Component Value Date/Time   CALCIUM 9.1 11/24/2019 1237   ALKPHOS 102 11/24/2019 1237   AST 18 11/24/2019 1237   ALT 26 11/24/2019 1237   BILITOT 0.2 11/24/2019 1237

## 2019-12-28 NOTE — Patient Instructions (Signed)
Carbohydrate Counting for Diabetes Mellitus, Adult  Carbohydrate counting is a method of keeping track of how many carbohydrates you eat. Eating carbohydrates naturally increases the amount of sugar (glucose) in the blood. Counting how many carbohydrates you eat helps keep your blood glucose within normal limits, which helps you manage your diabetes (diabetes mellitus). It is important to know how many carbohydrates you can safely have in each meal. This is different for every person. A diet and nutrition specialist (registered dietitian) can help you make a meal plan and calculate how many carbohydrates you should have at each meal and snack. Carbohydrates are found in the following foods:  Grains, such as breads and cereals.  Dried beans and soy products.  Starchy vegetables, such as potatoes, peas, and corn.  Fruit and fruit juices.  Milk and yogurt.  Sweets and snack foods, such as cake, cookies, candy, chips, and soft drinks. How do I count carbohydrates? There are two ways to count carbohydrates in food. You can use either of the methods or a combination of both. Reading "Nutrition Facts" on packaged food The "Nutrition Facts" list is included on the labels of almost all packaged foods and beverages in the [place] It includes:  The serving size.  Information about nutrients in each serving, including the grams (g) of carbohydrate per serving. To use the "Nutrition Facts":  Decide how many servings you will have.  Multiply the number of servings by the number of carbohydrates per serving.  The resulting number is the total amount of carbohydrates that you will be having. Learning standard serving sizes of other foods When you eat carbohydrate foods that are not packaged or do not include "Nutrition Facts" on the label, you need to measure the servings in order to count the amount of carbohydrates:  Measure the foods that you will eat with a food scale or measuring cup, if  needed.  Decide how many standard-size servings you will eat.  Multiply the number of servings by 15. Most carbohydrate-rich foods have about 15 g of carbohydrates per serving. ? For example, if you eat 8 oz (170 g) of strawberries, you will have eaten 2 servings and 30 g of carbohydrates (2 servings x 15 g = 30 g).  For foods that have more than one food mixed, such as soups and casseroles, you must count the carbohydrates in each food that is included. The following list contains standard serving sizes of common carbohydrate-rich foods. Each of these servings has about 15 g of carbohydrates:   hamburger bun or  English muffin.   oz (15 mL) syrup.   oz (14 g) jelly.  1 slice of bread.  1 six-inch tortilla.  3 oz (85 g) cooked rice or pasta.  4 oz (113 g) cooked dried beans.  4 oz (113 g) starchy vegetable, such as peas, corn, or potatoes.  4 oz (113 g) hot cereal.  4 oz (113 g) mashed potatoes or  of a large baked potato.  4 oz (113 g) canned or frozen fruit.  4 oz (120 mL) fruit juice.  4-6 crackers.  6 chicken nuggets.  6 oz (170 g) unsweetened dry cereal.  6 oz (170 g) plain fat-free yogurt or yogurt sweetened with artificial sweeteners.  8 oz (240 mL) milk.  8 oz (170 g) fresh fruit or one small piece of fruit.  24 oz (680 g) popped popcorn. Example of carbohydrate counting Sample meal  3 oz (85 g) chicken breast.  6 oz (170 g)   brown rice.  4 oz (113 g) corn.  8 oz (240 mL) milk.  8 oz (170 g) strawberries with sugar-free whipped topping. Carbohydrate calculation 1. Identify the foods that contain carbohydrates: ? Rice. ? Corn. ? Milk. ? Strawberries. 2. Calculate how many servings you have of each food: ? 2 servings rice. ? 1 serving corn. ? 1 serving milk. ? 1 serving strawberries. 3. Multiply each number of servings by 15 g: ? 2 servings rice x 15 g = 30 g. ? 1 serving corn x 15 g = 15 g. ? 1 serving milk x 15 g = 15 g. ? 1  serving strawberries x 15 g = 15 g. 4. Add together all of the amounts to find the total grams of carbohydrates eaten: ? 30 g + 15 g + 15 g + 15 g = 75 g of carbohydrates total. Summary  Carbohydrate counting is a method of keeping track of how many carbohydrates you eat.  Eating carbohydrates naturally increases the amount of sugar (glucose) in the blood.  Counting how many carbohydrates you eat helps keep your blood glucose within normal limits, which helps you manage your diabetes.  A diet and nutrition specialist (registered dietitian) can help you make a meal plan and calculate how many carbohydrates you should have at each meal and snack. This information is not intended to replace advice given to you by your health care provider. Make sure you discuss any questions you have with your health care provider. Document Revised: 04/17/2017 Document Reviewed: 03/06/2016 Elsevier Patient Education  2020 Elsevier Inc.  

## 2019-12-28 NOTE — Assessment & Plan Note (Signed)
Encouraged heart healthy diet, increase exercise, avoid trans fats, consider a krill oil cap daily Lab Results  Component Value Date   CHOL 152 11/24/2019   HDL 42 11/24/2019   LDLCALC 96 11/24/2019   LDLDIRECT 179.1 09/16/2012   TRIG 70 11/24/2019   CHOLHDL 3.6 06/02/2019

## 2019-12-28 NOTE — Assessment & Plan Note (Signed)
.  con't vita d and take otc vita d 1000u daily

## 2019-12-31 DIAGNOSIS — E039 Hypothyroidism, unspecified: Secondary | ICD-10-CM | POA: Diagnosis not present

## 2019-12-31 DIAGNOSIS — E1165 Type 2 diabetes mellitus with hyperglycemia: Secondary | ICD-10-CM | POA: Diagnosis not present

## 2019-12-31 DIAGNOSIS — E7800 Pure hypercholesterolemia, unspecified: Secondary | ICD-10-CM | POA: Diagnosis not present

## 2019-12-31 DIAGNOSIS — I10 Essential (primary) hypertension: Secondary | ICD-10-CM | POA: Diagnosis not present

## 2020-01-26 ENCOUNTER — Encounter (INDEPENDENT_AMBULATORY_CARE_PROVIDER_SITE_OTHER): Payer: Self-pay | Admitting: Family Medicine

## 2020-01-26 ENCOUNTER — Other Ambulatory Visit: Payer: Self-pay

## 2020-01-26 ENCOUNTER — Ambulatory Visit (INDEPENDENT_AMBULATORY_CARE_PROVIDER_SITE_OTHER): Payer: 59 | Admitting: Family Medicine

## 2020-01-26 VITALS — BP 118/72 | HR 84 | Temp 97.6°F | Ht 65.0 in | Wt 299.0 lb

## 2020-01-26 DIAGNOSIS — E559 Vitamin D deficiency, unspecified: Secondary | ICD-10-CM

## 2020-01-26 DIAGNOSIS — Z6841 Body Mass Index (BMI) 40.0 and over, adult: Secondary | ICD-10-CM | POA: Diagnosis not present

## 2020-01-26 MED ORDER — VITAMIN D (ERGOCALCIFEROL) 1.25 MG (50000 UNIT) PO CAPS: 50000.0000 [IU] | ORAL_CAPSULE | ORAL | 0 refills | Status: DC

## 2020-01-27 NOTE — Progress Notes (Signed)
Chief Complaint:   OBESITY Kiani is here to discuss her progress with her obesity treatment plan along with follow-up of her obesity related diagnoses. Kasumi is on the [employer] and states she is following her eating plan approximately 70% of the time. Leira states she is bike riding for 15 minutes 3 times per week.  Today's visit was #: 10 Starting weight: 308 lbs Starting date: 06/02/2019 Today's weight: 299 lbs Today's date: 01/26/2020 Total lbs lost to date: 9 Total lbs lost since last in-office visit: 5  Interim History: Jeyli continues to do well with weight loss on her plan. Her hunger is controlled and she likes her options. She is getting ready to go on a beach vacation and is concerned about food choices.  Subjective:   1. Vitamin D deficiency Camay is stable on Vit D, but her level is not yet at goal.  Assessment/Plan:   1. Vitamin D deficiency Low Vitamin D level contributes to fatigue and are associated with obesity, breast, and colon cancer. We will refill prescription Vitamin D for 1 month. Candiace will follow-up for routine testing of Vitamin D, at least 2-3 times per year to avoid over-replacement.  - Vitamin D, Ergocalciferol, (DRISDOL) 1.25 MG (50000 UNIT) CAPS capsule; Take 1 capsule (50,000 Units total) by mouth every 7 (seven) days.  Dispense: 4 capsule; Refill: 0  2. Class 3 severe obesity with serious comorbidity and body mass index (BMI) of 45.0 to 49.9 in adult, unspecified obesity type ([place]) Audie is currently in the action stage of change. As such, her goal is to continue with weight loss efforts. She has agreed to the [employer].   Exercise goals: Burnett is to increase bike riding to 5 times per week for 15 minutes.  Behavioral modification strategies: travel eating strategies and celebration eating strategies.  Latasia has agreed to follow-up with our clinic in 4 weeks. She was informed of the importance of frequent follow-up visits to  maximize her success with intensive lifestyle modifications for her multiple health conditions.   Objective:   Blood pressure 118/72, pulse 84, temperature 97.6 F (36.4 C), temperature source Oral, height 5\' 5"  (1.651 m), weight 299 lb (135.6 kg), SpO2 99 %. Body mass index is 49.76 kg/m.  General: Cooperative, alert, well developed, in no acute distress. HEENT: Conjunctivae and lids unremarkable. Cardiovascular: Regular rhythm.  Lungs: Normal work of breathing. Neurologic: No focal deficits.   Lab Results  Component Value Date   CREATININE 0.77 11/24/2019   BUN 11 11/24/2019   NA 142 11/24/2019   K 3.8 11/24/2019   CL 103 11/24/2019   CO2 22 11/24/2019   Lab Results  Component Value Date   ALT 26 11/24/2019   AST 18 11/24/2019   ALKPHOS 102 11/24/2019   BILITOT 0.2 11/24/2019   Lab Results  Component Value Date   HGBA1C 6.1 (H) 11/24/2019   HGBA1C 6.4 07/25/2017   Lab Results  Component Value Date   INSULIN 31.1 (H) 11/24/2019   INSULIN 22.0 06/02/2019   Lab Results  Component Value Date   TSH 0.500 06/02/2019   Lab Results  Component Value Date   CHOL 152 11/24/2019   HDL 42 11/24/2019   LDLCALC 96 11/24/2019   LDLDIRECT 179.1 09/16/2012   TRIG 70 11/24/2019   CHOLHDL 3.6 06/02/2019   Lab Results  Component Value Date   WBC 4.0 06/02/2019   HGB 14.2 06/02/2019   HCT 42.9 06/02/2019   MCV 91 06/02/2019  PLT 217 06/02/2019   No results found for: IRON, TIBC, FERRITIN  Attestation Statements:   Reviewed by clinician on day of visit: allergies, medications, problem list, medical history, surgical history, family history, social history, and previous encounter notes.  Time spent on visit including pre-visit chart review and post-visit care and charting was 31 minutes.    I, Trixie Dredge, am acting as transcriptionist for Dennard Nip, MD.  I have reviewed the above documentation for accuracy and completeness, and I agree with the above. -   Dennard Nip, MD

## 2020-02-11 ENCOUNTER — Encounter: Payer: Self-pay | Admitting: Family Medicine

## 2020-02-11 DIAGNOSIS — E039 Hypothyroidism, unspecified: Secondary | ICD-10-CM | POA: Diagnosis not present

## 2020-02-11 DIAGNOSIS — L659 Nonscarring hair loss, unspecified: Secondary | ICD-10-CM | POA: Diagnosis not present

## 2020-02-15 ENCOUNTER — Telehealth: Payer: Self-pay | Admitting: Family Medicine

## 2020-02-15 NOTE — Telephone Encounter (Signed)
Caller : Elanore   Call Back # [phone]  Per patient specialist her iron is high, patient states was told to follow up with pcp on a referral to a hematologist.  Please advise

## 2020-02-15 NOTE — Telephone Encounter (Signed)
Please advise 

## 2020-02-16 NOTE — Telephone Encounter (Signed)
Hematology

## 2020-02-16 NOTE — Telephone Encounter (Signed)
No--- i'm looking for doc in chart from the dr that told her she needed hematology.  They are going to want records  I don't see anything in epic

## 2020-02-16 NOTE — Telephone Encounter (Signed)
Which specialist -----?

## 2020-02-18 NOTE — Telephone Encounter (Signed)
Patient's states that DR Chalmers Cater. (endocrinologist) Told her that she needed a referral to Hematologist.   Dr. Jacelyn Pi, MD [place], [place] [id number]

## 2020-02-18 NOTE — Telephone Encounter (Signed)
We need her records so we can send them to heme

## 2020-02-22 NOTE — Telephone Encounter (Signed)
Caller: Mela Phone number : [phone]  Patient is calling back to check on the status of the hematology referral.

## 2020-02-23 ENCOUNTER — Other Ambulatory Visit: Payer: Self-pay

## 2020-02-23 ENCOUNTER — Ambulatory Visit (INDEPENDENT_AMBULATORY_CARE_PROVIDER_SITE_OTHER): Payer: 59 | Admitting: Family Medicine

## 2020-02-23 ENCOUNTER — Encounter (INDEPENDENT_AMBULATORY_CARE_PROVIDER_SITE_OTHER): Payer: Self-pay | Admitting: Family Medicine

## 2020-02-23 VITALS — BP 125/75 | HR 67 | Temp 98.1°F | Ht 65.0 in | Wt 299.0 lb

## 2020-02-23 DIAGNOSIS — Z6841 Body Mass Index (BMI) 40.0 and over, adult: Secondary | ICD-10-CM

## 2020-02-23 DIAGNOSIS — Z9189 Other specified personal risk factors, not elsewhere classified: Secondary | ICD-10-CM | POA: Diagnosis not present

## 2020-02-23 DIAGNOSIS — E559 Vitamin D deficiency, unspecified: Secondary | ICD-10-CM | POA: Diagnosis not present

## 2020-02-23 DIAGNOSIS — E119 Type 2 diabetes mellitus without complications: Secondary | ICD-10-CM

## 2020-02-23 DIAGNOSIS — E66813 Obesity, class 3: Secondary | ICD-10-CM

## 2020-02-23 DIAGNOSIS — I10 Essential (primary) hypertension: Secondary | ICD-10-CM

## 2020-02-23 DIAGNOSIS — R790 Abnormal level of blood mineral: Secondary | ICD-10-CM

## 2020-02-23 MED ORDER — BLOOD GLUCOSE MONITOR KIT: PACK | 0 refills | Status: AC

## 2020-02-23 NOTE — Telephone Encounter (Signed)
Tried calling Dr. Porfirio Oar office and was unable to speak with anyone. Called patient and asked if she can reach out to get records. Pt given out fax number

## 2020-02-23 NOTE — Progress Notes (Signed)
Chief Complaint:   OBESITY Tracey Page is here to discuss her progress with her obesity treatment plan along with follow-up of her obesity related diagnoses. Tracey Page is on the [employer] and states she is following her eating plan approximately 70% of the time. Tracey Page states she is doing 0 minutes 0 times per week.  Today's visit was #: 11 Starting weight: 308 lbs Starting date: 06/02/2019 Today's weight: 299 lbs Today's date: 02/23/2020 Total lbs lost to date: 9 Total lbs lost since last in-office visit: 0  Interim History: Tracey Page states her hectic work schedule makes it difficult to eat a full lunch. She will prefer to snack throughout the day in between meetings.  Subjective:   1. Type 2 diabetes mellitus without complication, without long-term current use of insulin (HCC) Tracey Page's A1c on 11/24/2019 was 6.1. She has been off oral semaglutide 7 mg for approximately 3 weeks.  2. Essential hypertension Tracey Page's blood pressure is well controlled at her office visit today. She is on amlodipine 10 mg q daily, furosemide 20 mg BID, and metoprolol succinate XL 50 mg 3 tablets q daily.  3. Vitamin D deficiency Tracey Page's Vit D level on 11/24/2019 was 34.6. She recently stopped prescription strength Vit D supplementation on the advice from her Endocrinologist, Dr. Chalmers Cater.  4. Raised serum iron Tracey Page's iron level was elevated at her last appointment with her Endocrinologist, Dr. Chalmers Cater. She was instructed to stop all supplements and vitamins.  5. At risk for hyperglycemia Tracey Page is at risk for hyperglycemia due to being off oral semaglutide for several weeks, and was instructed to restart.  Assessment/Plan:   1. Type 2 diabetes mellitus without complication, without long-term current use of insulin (HCC) [name], neuropathy, limb loss, blindness, coronary artery disease, and death. Intensive  lifestyle modification including diet, exercise and weight loss are the first line of treatment for diabetes. We will check labs today. We will send glucometer kit to the pharmacy. Tracey Page agreed to restart oral GLP-1.  - Comprehensive metabolic panel - CBC with Differential/Platelet - Hemoglobin A1c - Insulin, random - Lipid Panel With LDL/HDL Ratio  - blood glucose meter kit and supplies KIT; Dispense based on patient and insurance preference. Use up to four times daily as directed. (FOR ICD-9 250.00, 250.01).  Dispense: 1 each; Refill: 0  2. Essential hypertension Tracey Page is working on healthy weight loss and exercise to improve blood pressure control. We will watch for signs of hypotension as she continues her lifestyle modifications. We will check labs today. Tracey Page agreed to continue her current anti-hypertensive therapy.  - Comprehensive metabolic panel - Lipid Panel With LDL/HDL Ratio  3. Vitamin D deficiency Low Vitamin D level contributes to fatigue and are associated with obesity, breast, and colon cancer. We will check labs today. Tracey Page will follow-up for routine testing of Vitamin D, at least 2-3 times per year to avoid over-replacement.  - VITAMIN D 25 Hydroxy (Vit-D Deficiency, Fractures)  4. Raised serum iron We will check labs today. Tracey Page will continue to hold off on all vitamins and supplements.  - CBC with Differential/Platelet - Vitamin B12 - Folate  5. At risk for hyperglycemia Tracey Page was given approximately 15 minutes of counseling today regarding prevention of hyperglycemia. She was advised of hyperglycemia causes and the fact hyperglycemia is often asymptomatic. Tracey Page was instructed to avoid skipping meals, eat regular protein rich meals and schedule low calorie but protein  rich snacks as needed.   Repetitive spaced learning was employed today to elicit superior memory formation and behavioral change  - Hemoglobin A1c  6. Class 3 severe obesity with serious  comorbidity and body mass index (BMI) of 45.0 to 49.9 in adult, unspecified obesity type ([place]) Tracey Page is currently in the action stage of change. As such, her goal is to continue with weight loss efforts. She has agreed to the [employer].   Provided handouts today: Protein Content of Foods and High Protein Snack Foods.  Exercise goals: No exercise has been prescribed at this time.  Behavioral modification strategies: increasing lean protein intake, decreasing simple carbohydrates, no skipping meals and meal planning and cooking strategies.  Tracey Page has agreed to follow-up with our clinic in 4 weeks. She was informed of the importance of frequent follow-up visits to maximize her success with intensive lifestyle modifications for her multiple health conditions.   Tracey Page was informed we would discuss her lab results at her next visit unless there is a critical issue that needs to be addressed sooner. Tracey Page agreed to keep her next visit at the agreed upon time to discuss these results.  Objective:   Blood pressure 125/75, pulse 67, temperature 98.1 F (36.7 C), temperature source Oral, height '5\' 5"'  (1.651 m), weight 299 lb (135.6 kg), SpO2 100 %. Body mass index is 49.76 kg/m.  General: Cooperative, alert, well developed, in no acute distress. HEENT: Conjunctivae and lids unremarkable. Cardiovascular: Regular rhythm.  Lungs: Normal work of breathing. Neurologic: No focal deficits.   Lab Results  Component Value Date   CREATININE 0.77 11/24/2019   BUN 11 11/24/2019   NA 142 11/24/2019   K 3.8 11/24/2019   CL 103 11/24/2019   CO2 22 11/24/2019   Lab Results  Component Value Date   ALT 26 11/24/2019   AST 18 11/24/2019   ALKPHOS 102 11/24/2019   BILITOT 0.2 11/24/2019   Lab Results  Component Value Date   HGBA1C 6.1 (H) 11/24/2019   HGBA1C 6.4 07/25/2017   Lab Results  Component Value Date   INSULIN 31.1 (H) 11/24/2019   INSULIN 22.0 06/02/2019   Lab Results  Component  Value Date   TSH 0.500 06/02/2019   Lab Results  Component Value Date   CHOL 152 11/24/2019   HDL 42 11/24/2019   LDLCALC 96 11/24/2019   LDLDIRECT 179.1 09/16/2012   TRIG 70 11/24/2019   CHOLHDL 3.6 06/02/2019   Lab Results  Component Value Date   WBC 4.0 06/02/2019   HGB 14.2 06/02/2019   HCT 42.9 06/02/2019   MCV 91 06/02/2019   PLT 217 06/02/2019   No results found for: IRON, TIBC, FERRITIN  Attestation Statements:   Reviewed by clinician on day of visit: allergies, medications, problem list, medical history, surgical history, family history, social history, and previous encounter notes.   I, Trixie Dredge, am acting as transcriptionist for Dennard Nip, MD.  I have reviewed the above documentation for accuracy and completeness, and I agree with the above. -  Dennard Nip, MD

## 2020-02-24 ENCOUNTER — Other Ambulatory Visit (INDEPENDENT_AMBULATORY_CARE_PROVIDER_SITE_OTHER): Payer: Self-pay

## 2020-02-24 DIAGNOSIS — E119 Type 2 diabetes mellitus without complications: Secondary | ICD-10-CM

## 2020-02-24 LAB — LIPID PANEL WITH LDL/HDL RATIO
Cholesterol, Total: 162 mg/dL (ref 100–199)
HDL: 43 mg/dL (ref >39)
LDL Chol Calc (NIH): 104 mg/dL — ABNORMAL HIGH (ref 0–99)
LDL/HDL Ratio: 2.4 ratio (ref 0.0–3.2)
Triglycerides: 76 mg/dL (ref 0–149)
VLDL Cholesterol Cal: 15 mg/dL (ref 5–40)

## 2020-02-24 LAB — CBC WITH DIFFERENTIAL/PLATELET
Basophils Absolute: 0.1 10*3/uL (ref 0.0–0.2)
Basos: 1 %
EOS (ABSOLUTE): 0.2 10*3/uL (ref 0.0–0.4)
Eos: 2 %
Hemoglobin: 14.1 g/dL (ref 11.1–15.9)
Immature Grans (Abs): 0 10*3/uL (ref 0.0–0.1)
Immature Granulocytes: 0 %
Lymphocytes Absolute: 1.9 10*3/uL (ref 0.7–3.1)
Lymphs: 30 %
MCH: 31.5 pg (ref 26.6–33.0)
MCHC: 33.9 g/dL (ref 31.5–35.7)
MCV: 93 fL (ref 79–97)
Monocytes Absolute: 0.4 10*3/uL (ref 0.1–0.9)
Monocytes: 7 %
Neutrophils Absolute: 3.8 10*3/uL (ref 1.4–7.0)
Neutrophils: 60 %
Platelets: 238 10*3/uL (ref 150–450)
RBC: 4.47 x10E6/uL (ref 3.77–5.28)
RDW: 14.2 % (ref 11.7–15.4)
WBC: 6.3 10*3/uL (ref 3.4–10.8)

## 2020-02-24 LAB — VITAMIN D 25 HYDROXY (VIT D DEFICIENCY, FRACTURES): Vit D, 25-Hydroxy: 44.4 ng/mL (ref 30.0–100.0)

## 2020-02-24 LAB — ANEMIA PANEL
Ferritin: 266 ng/mL — ABNORMAL HIGH (ref 15–150)
Folate, Hemolysate: 229 ng/mL
Folate, RBC: 550 ng/mL (ref >498)
Hematocrit: 41.6 % (ref 34.0–46.6)
Iron Saturation: 19 % (ref 15–55)
Iron: 54 ug/dL (ref 27–139)
Retic Ct Pct: 1.6 % (ref 0.6–2.6)
Total Iron Binding Capacity: 279 ug/dL (ref 250–450)
UIBC: 225 ug/dL (ref 118–369)
Vitamin B-12: 247 pg/mL (ref 232–1245)

## 2020-02-24 LAB — COMPREHENSIVE METABOLIC PANEL
ALT: 18 IU/L (ref 0–32)
AST: 16 IU/L (ref 0–40)
Albumin/Globulin Ratio: 1.3 (ref 1.2–2.2)
Albumin: 4.1 g/dL (ref 3.8–4.8)
Alkaline Phosphatase: 107 IU/L (ref 48–121)
BUN/Creatinine Ratio: 12 (ref 12–28)
BUN: 10 mg/dL (ref 8–27)
Bilirubin Total: 0.4 mg/dL (ref 0.0–1.2)
CO2: 24 mmol/L (ref 20–29)
Calcium: 9.2 mg/dL (ref 8.7–10.3)
Chloride: 101 mmol/L (ref 96–106)
Creatinine, Ser: 0.82 mg/dL (ref 0.57–1.00)
GFR calc Af Amer: 87 mL/min/{1.73_m2} (ref >59)
GFR calc non Af Amer: 76 mL/min/{1.73_m2} (ref >59)
Globulin, Total: 3.1 g/dL (ref 1.5–4.5)
Glucose: 105 mg/dL — ABNORMAL HIGH (ref 65–99)
Potassium: 4.1 mmol/L (ref 3.5–5.2)
Sodium: 139 mmol/L (ref 134–144)
Total Protein: 7.2 g/dL (ref 6.0–8.5)

## 2020-02-24 LAB — INSULIN, RANDOM: INSULIN: 18.3 u[IU]/mL (ref 2.6–24.9)

## 2020-02-24 LAB — HEMOGLOBIN A1C
Est. average glucose Bld gHb Est-mCnc: 123 mg/dL
Hgb A1c MFr Bld: 5.9 % — ABNORMAL HIGH (ref 4.8–5.6)

## 2020-02-24 LAB — FOLATE: Folate: 4.3 ng/mL (ref >3.0)

## 2020-02-24 MED ORDER — ONETOUCH DELICA LANCETS 33G MISC: 1.0000 | Freq: Every day | 0 refills | Status: AC

## 2020-02-24 MED ORDER — ONETOUCH ULTRA VI STRP: ORAL_STRIP | 0 refills | Status: AC

## 2020-02-24 MED ORDER — ONETOUCH ULTRA 2 W/DEVICE KIT: 1.0000 | PACK | Freq: Every day | 0 refills | Status: AC

## 2020-02-28 NOTE — Telephone Encounter (Signed)
Patient called stating that she got in contact with Dr Almetta Lovely office about records. Patient states they will send to pcp

## 2020-02-28 NOTE — Telephone Encounter (Signed)
FYI

## 2020-03-01 ENCOUNTER — Other Ambulatory Visit (INDEPENDENT_AMBULATORY_CARE_PROVIDER_SITE_OTHER): Payer: Self-pay | Admitting: Family Medicine

## 2020-03-01 DIAGNOSIS — E559 Vitamin D deficiency, unspecified: Secondary | ICD-10-CM

## 2020-03-02 ENCOUNTER — Telehealth: Payer: Self-pay

## 2020-03-02 MED ORDER — VITAMIN D (ERGOCALCIFEROL) 1.25 MG (50000 UNIT) PO CAPS: 50000.0000 [IU] | ORAL_CAPSULE | ORAL | 0 refills | Status: DC

## 2020-03-07 ENCOUNTER — Other Ambulatory Visit: Payer: Self-pay

## 2020-03-07 NOTE — Telephone Encounter (Signed)
Caller: Timisha Call back phone number:[phone]  Patient called on 03/03/20 at 05:00 pm upset stating she has not heard anything obout the status of the referral to hematology. The patient stated there has been a lot of back and forth conversation with no results.  Patient would like a call back.

## 2020-03-08 NOTE — Telephone Encounter (Signed)
Spoke with patient. Referral placed for hematology.Advised patient we haven't received notes yet and unable to reach Dr. Almetta Lovely office. I advised that hematology may be able to get notes from Dr. Porfirio Oar office

## 2020-03-22 ENCOUNTER — Other Ambulatory Visit: Payer: Self-pay

## 2020-03-22 ENCOUNTER — Ambulatory Visit (INDEPENDENT_AMBULATORY_CARE_PROVIDER_SITE_OTHER): Payer: 59 | Admitting: Family Medicine

## 2020-03-22 ENCOUNTER — Encounter (INDEPENDENT_AMBULATORY_CARE_PROVIDER_SITE_OTHER): Payer: Self-pay | Admitting: Family Medicine

## 2020-03-22 VITALS — BP 136/79 | HR 82 | Temp 98.4°F | Ht 65.0 in | Wt 295.0 lb

## 2020-03-22 DIAGNOSIS — Z6841 Body Mass Index (BMI) 40.0 and over, adult: Secondary | ICD-10-CM

## 2020-03-22 DIAGNOSIS — E1169 Type 2 diabetes mellitus with other specified complication: Secondary | ICD-10-CM

## 2020-03-22 DIAGNOSIS — E559 Vitamin D deficiency, unspecified: Secondary | ICD-10-CM

## 2020-03-22 DIAGNOSIS — Z9189 Other specified personal risk factors, not elsewhere classified: Secondary | ICD-10-CM | POA: Diagnosis not present

## 2020-03-22 DIAGNOSIS — K5909 Other constipation: Secondary | ICD-10-CM

## 2020-03-22 MED ORDER — VITAMIN D (ERGOCALCIFEROL) 1.25 MG (50000 UNIT) PO CAPS: 50000.0000 [IU] | ORAL_CAPSULE | ORAL | 0 refills | Status: DC

## 2020-03-27 NOTE — Progress Notes (Signed)
Chief Complaint:   OBESITY Tracey Page is here to discuss her progress with her obesity treatment plan along with follow-up of her obesity related diagnoses. Tracey Page is on the [employer] and states she is following her eating plan approximately 50% of the time. Tracey Page states she is walking for 20-30 minutes 2 times per week.  Today's visit was #: 12 Starting weight: 308 lbs Starting date: 06/02/2019 Today's weight: 295 lbs Today's date: 03/22/2020 Total lbs lost to date: 13 Total lbs lost since last in-office visit: 4  Interim History: Tracey Page increased her protein intake this time, and was more mindful of her eating. She was traveling for Tracey Page, and did some celebration eating. She also has been working long hours, 12+ hour per Page. She notes following the plan has been very challenging this last time.  Subjective:   1. Type 2 diabetes mellitus with other specified complication, without long-term current use of insulin (HCC) Tracey Page is on Rybelsus 14 mg and is tolerating with no problems or concerns. Last A1c was 5.9 (down from prior). I performed glucometer training, and showed her how to use it and obtain her blood sugars.   2. Vitamin D deficiency Tracey Page denies complaints with her medications, and is tolerating them well. She denies nausea or GI upset.  3. Other constipation Tracey Page has a new diagnosis of constipation. She has had harder than normal stools and decreased BMs.  4. At risk for hypoglycemia Tracey Page is at increased risk for hypoglycemia due to changes in diet, diagnosis of diabetes, and/or insulin use.    Assessment/Plan:   1. Type 2 diabetes mellitus with other specified complication, without long-term current use of insulin (HCC) [name], neuropathy, limb loss, blindness, coronary artery disease, and death. Intensive lifestyle modification including diet, exercise  and weight loss are the first line of treatment for diabetes. Tracey Page will continue Rybelsus, and will continue home monitoring. Counseling was given on glucose meter and monitoring.  2. Vitamin D deficiency Low Vitamin D level contributes to fatigue and are associated with obesity, breast, and colon cancer. We will refill prescription Vitamin D for 1 month. Tracey Page will follow-up for routine testing of Vitamin D, at least 2-3 times per year to avoid over-replacement.  - Vitamin D, Ergocalciferol, (DRISDOL) 1.25 MG (50000 UNIT) CAPS capsule; Take 1 capsule (50,000 Units total) by mouth every 7 (seven) days.  Dispense: 4 capsule; Refill: 0  3. Other constipation Tracey Page was informed that a decrease in bowel movement frequency is normal while losing weight, but stools should not be hard or painful. I explained diet plan can cause decreased BMs with increased protein and decreased simple carbohydrates. She is to drink 80+ oz of water per Page, and probiotics are ok but I told the patient to follow the plan. Orders and follow up as documented in patient record.   4. At risk for hypoglycemia Tracey Page was given approximately 30 minutes of counseling today regarding prevention of hypoglycemia. She was advised of symptoms of hypoglycemia. Tracey Page was instructed to avoid skipping meals, eat regular protein rich meals and schedule low calorie snacks as needed.   Repetitive spaced learning was employed today to elicit superior memory formation and behavioral change  5. Class 3 severe obesity with serious comorbidity and body mass index (BMI) of 45.0 to 49.9 in adult, unspecified obesity type ([place]) Tracey Page is currently in the action stage of change. As such,  her goal is to continue with weight loss efforts. She has agreed to the [employer].   Exercise goals: As is.  Behavioral modification strategies: travel eating strategies and celebration eating strategies.  Tracey Page has agreed to follow-up with our clinic in 2 to  3 weeks. She was informed of the importance of frequent follow-up visits to maximize her success with intensive lifestyle modifications for her multiple health conditions.   Objective:   Blood pressure 136/79, pulse 82, temperature 98.4 F (36.9 C), temperature source Oral, height 5\' 5"  (1.651 m), weight 295 lb (133.8 kg), SpO2 99 %. Body mass index is 49.09 kg/m.  General: Cooperative, alert, well developed, in no acute distress. HEENT: Conjunctivae and lids unremarkable. Cardiovascular: Regular rhythm.  Lungs: Normal work of breathing. Neurologic: No focal deficits.   Lab Results  Component Value Date   CREATININE 0.82 02/23/2020   BUN 10 02/23/2020   NA 139 02/23/2020   K 4.1 02/23/2020   CL 101 02/23/2020   CO2 24 02/23/2020   Lab Results  Component Value Date   ALT 18 02/23/2020   AST 16 02/23/2020   ALKPHOS 107 02/23/2020   BILITOT 0.4 02/23/2020   Lab Results  Component Value Date   HGBA1C 5.9 (H) 02/23/2020   HGBA1C 6.1 (H) 11/24/2019   HGBA1C 6.4 07/25/2017   Lab Results  Component Value Date   INSULIN 18.3 02/23/2020   INSULIN 31.1 (H) 11/24/2019   INSULIN 22.0 06/02/2019   Lab Results  Component Value Date   TSH 0.500 06/02/2019   Lab Results  Component Value Date   CHOL 162 02/23/2020   HDL 43 02/23/2020   LDLCALC 104 (H) 02/23/2020   LDLDIRECT 179.1 09/16/2012   TRIG 76 02/23/2020   CHOLHDL 3.6 06/02/2019   Lab Results  Component Value Date   WBC 6.3 02/23/2020   HGB 14.1 02/23/2020   HCT 41.6 02/23/2020   MCV 93 02/23/2020   PLT 238 02/23/2020   Lab Results  Component Value Date   IRON 54 02/23/2020   TIBC 279 02/23/2020   FERRITIN 266 (H) 02/23/2020   Attestation Statements:   Reviewed by clinician on Page of visit: allergies, medications, problem list, medical history, surgical history, family history, social history, and previous encounter notes.   I, Trixie Dredge, am acting as Location manager for Dennard Nip, MD.  I  have reviewed the above documentation for accuracy and completeness, and I agree with the above. -  Dennard Nip, MD

## 2020-04-07 ENCOUNTER — Other Ambulatory Visit: Payer: Self-pay | Admitting: Family

## 2020-04-11 ENCOUNTER — Encounter: Payer: Self-pay | Admitting: Family

## 2020-04-11 ENCOUNTER — Inpatient Hospital Stay: Payer: 59

## 2020-04-11 ENCOUNTER — Inpatient Hospital Stay: Payer: 59 | Attending: Family | Admitting: Family

## 2020-04-11 ENCOUNTER — Other Ambulatory Visit: Payer: Self-pay

## 2020-04-11 VITALS — BP 144/78 | HR 77 | Temp 98.2°F | Resp 17 | Ht 65.5 in | Wt 298.3 lb

## 2020-04-11 DIAGNOSIS — I10 Essential (primary) hypertension: Secondary | ICD-10-CM | POA: Diagnosis not present

## 2020-04-11 DIAGNOSIS — R5383 Other fatigue: Secondary | ICD-10-CM | POA: Insufficient documentation

## 2020-04-11 DIAGNOSIS — E039 Hypothyroidism, unspecified: Secondary | ICD-10-CM | POA: Diagnosis not present

## 2020-04-11 DIAGNOSIS — E785 Hyperlipidemia, unspecified: Secondary | ICD-10-CM | POA: Diagnosis not present

## 2020-04-11 DIAGNOSIS — R634 Abnormal weight loss: Secondary | ICD-10-CM | POA: Diagnosis not present

## 2020-04-11 DIAGNOSIS — F419 Anxiety disorder, unspecified: Secondary | ICD-10-CM | POA: Diagnosis not present

## 2020-04-11 DIAGNOSIS — R69 Illness, unspecified: Secondary | ICD-10-CM | POA: Diagnosis not present

## 2020-04-11 DIAGNOSIS — E119 Type 2 diabetes mellitus without complications: Secondary | ICD-10-CM | POA: Insufficient documentation

## 2020-04-11 DIAGNOSIS — R7989 Other specified abnormal findings of blood chemistry: Secondary | ICD-10-CM | POA: Insufficient documentation

## 2020-04-11 DIAGNOSIS — Z79899 Other long term (current) drug therapy: Secondary | ICD-10-CM | POA: Insufficient documentation

## 2020-04-11 DIAGNOSIS — E669 Obesity, unspecified: Secondary | ICD-10-CM | POA: Diagnosis not present

## 2020-04-11 LAB — CMP (CANCER CENTER ONLY)
ALT: 15 U/L (ref 0–44)
AST: 13 U/L — ABNORMAL LOW (ref 15–41)
Albumin: 4.3 g/dL (ref 3.5–5.0)
Alkaline Phosphatase: 74 U/L (ref 38–126)
Anion gap: 8 (ref 5–15)
BUN: 12 mg/dL (ref 8–23)
CO2: 28 mmol/L (ref 22–32)
Calcium: 9.4 mg/dL (ref 8.9–10.3)
Chloride: 104 mmol/L (ref 98–111)
Creatinine: 0.86 mg/dL (ref 0.44–1.00)
GFR, Est AFR Am: >60 mL/min (ref >60)
GFR, Estimated: >60 mL/min (ref >60)
Glucose, Bld: 103 mg/dL — ABNORMAL HIGH (ref 70–99)
Potassium: 3.7 mmol/L (ref 3.5–5.1)
Sodium: 140 mmol/L (ref 135–145)
Total Bilirubin: 0.4 mg/dL (ref 0.3–1.2)
Total Protein: 7.7 g/dL (ref 6.5–8.1)

## 2020-04-11 LAB — CBC WITH DIFFERENTIAL (CANCER CENTER ONLY)
Abs Immature Granulocytes: 0.01 10*3/uL (ref 0.00–0.07)
Basophils Absolute: 0 10*3/uL (ref 0.0–0.1)
Basophils Relative: 1 %
Eosinophils Absolute: 0.1 10*3/uL (ref 0.0–0.5)
Eosinophils Relative: 2 %
HCT: 41.9 % (ref 36.0–46.0)
Hemoglobin: 14 g/dL (ref 12.0–15.0)
Immature Granulocytes: 0 %
Lymphocytes Relative: 31 %
Lymphs Abs: 1.7 10*3/uL (ref 0.7–4.0)
MCH: 30.9 pg (ref 26.0–34.0)
MCHC: 33.4 g/dL (ref 30.0–36.0)
MCV: 92.5 fL (ref 80.0–100.0)
Monocytes Absolute: 0.4 10*3/uL (ref 0.1–1.0)
Monocytes Relative: 7 %
Neutro Abs: 3.2 10*3/uL (ref 1.7–7.7)
Neutrophils Relative %: 59 %
Platelet Count: 245 10*3/uL (ref 150–400)
RBC: 4.53 MIL/uL (ref 3.87–5.11)
RDW: 14.2 % (ref 11.5–15.5)
WBC Count: 5.4 10*3/uL (ref 4.0–10.5)
nRBC: 0 % (ref 0.0–0.2)

## 2020-04-11 LAB — IRON AND TIBC
Iron: 52 ug/dL (ref 41–142)
Saturation Ratios: 17 % — ABNORMAL LOW (ref 21–57)
TIBC: 308 ug/dL (ref 236–444)
UIBC: 255 ug/dL (ref 120–384)

## 2020-04-11 LAB — SAVE SMEAR(SSMR), FOR PROVIDER SLIDE REVIEW

## 2020-04-11 LAB — FERRITIN: Ferritin: 196 ng/mL (ref 11–307)

## 2020-04-11 LAB — LACTATE DEHYDROGENASE: LDH: 202 U/L — ABNORMAL HIGH (ref 98–192)

## 2020-04-11 NOTE — Progress Notes (Addendum)
Hematology/Oncology Consultation   Name: Tracey Page      MRN: [id number]    Location: Room/bed info not found  Date: 04/11/2020 Time:10:56 AM   REFERRING PHYSICIAN: Roma Schanz, DO  REASON FOR CONSULT: Iron excess   DIAGNOSIS: elevated Ferritin   HISTORY OF PRESENT ILLNESS: Tracey Page is a very pleasant 64 yo African [place] female here today for an elevated ferritin level, 266. Her iron saturation was stable at 19%.  Hgb 14.0, MCV 92, platelets 245.  She has a long history of lymphedema in her lower extremities and takes Lasix PO BID.  No tenderness, numbness or tingling in her extremities.  No falls or syncope to report.  She has noted intermittent fatigue over the last 2 months.  She states that she has had intermittent anemia in the past.  No problems with frequent infection. No fever, chills, hot flashes/night sweats, n/v, cough, rash, headaches, dizziness, blurred or loss of vision, SOB, chest pain, palpitations, abdominal pain or changes in bowel or bladder habits.  She has occasional constipation and plans to try smooth move tea when needed.   She was diagnosed with type 2 diabetes around 4 months ago and is currently on Rybelsus.  She does have thyroid disease and takes levothyroxine.  She is postmenopausal and states that she went through the change of life naturally and without any issues.  No family history of elevated iron or liver disease.  No personal or familial history of cancer that she knows of.  Her father passed away at 11 from an aneurysm and her mother passed away at 1 from a heart attack.   [facility] [place] working on weight loss.  She eats a healthy diet. She does not eat red meat, pork or shellfish. She is hydrating well and her weight is described as stable.  She rides a stationary bike for exercise.   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   ROS: All other 10 point  review of systems is negative.   PAST MEDICAL HISTORY:   Past Medical History:  Diagnosis Date  . Anxiety   . Bilateral swelling of feet and ankles   . Diverticulitis   . Food allergy   . Hyperlipidemia   . Hypertension   . Hypothyroidism   . Lymphedema   . Obesity   . Pre-diabetes   . Thyroid disease     ALLERGIES: Allergies  Allergen Reactions  . Diovan [Valsartan] Swelling and Other (See Comments)    angioedema  . Iodine Swelling  . Lisinopril Swelling  . Shellfish Allergy Swelling    Swelling of tongue  . Sulfonamide Derivatives Swelling    Swelling in hand/knees      MEDICATIONS:  Current Outpatient Medications on File Prior to Visit  Medication Sig Dispense Refill  . ALPRAZolam (XANAX) 0.25 MG tablet TAKE ONE TABLET BY MOUTH TWICE DAILY AS NEEDED FOR ANXIETY 45 tablet 0  . amLODipine (NORVASC) 10 MG tablet Take 1 tablet (10 mg total) by mouth daily. 90 tablet 1  . atorvastatin (LIPITOR) 20 MG tablet Take 1 tablet (20 mg total) by mouth daily. 90 tablet 1  . azelastine (ASTELIN) 0.1 % nasal spray Place 1 spray into both nostrils 2 (two) times daily. Use in each nostril as directed 30 mL 12  . blood glucose meter kit and supplies KIT Dispense based on patient and insurance preference. Use up to  four times daily as directed. (FOR ICD-9 250.00, 250.01). 1 each 0  . Blood Glucose Monitoring Suppl (ONE TOUCH ULTRA 2) w/Device KIT 1 kit by Does not apply route daily. 1 kit 0  . EPINEPHrine 0.3 mg/0.3 mL IJ SOAJ injection INJECT CONTENTS OF 1 PEN AS NEEDED FOR ALLERGIC REACTION    . fluticasone (FLONASE) 50 MCG/ACT nasal spray Place 2 sprays into both nostrils daily. 16 g 6  . furosemide (LASIX) 20 MG tablet Take 1 tablet (20 mg total) by mouth 2 (two) times daily. 180 tablet 1  . glucose blood (ONETOUCH ULTRA) test strip Use as instructed 100 each 0  . levocetirizine (XYZAL) 5 MG tablet Take 1 tablet (5 mg total) by mouth every evening. 30 tablet 5  . liothyronine  (CYTOMEL) 5 MCG tablet Take 5 mcg by mouth daily.    . metoprolol succinate (TOPROL-XL) 50 MG 24 hr tablet TAKE 3 TABLETS BY MOUTH ONCE DAILY IMMEDIATELY  FOLLOWING  A  MEAL 270 tablet 1  . NONFORMULARY OR COMPOUNDED ITEM Compression socks  30-40 mmhg  #1  As directed 1 each 0  . OneTouch Delica Lancets 88C MISC 1 each by Does not apply route daily. 100 each 0  . pantoprazole (PROTONIX) 20 MG tablet Take 1 tablet (20 mg total) by mouth daily. (Patient taking differently: Take 20 mg by mouth daily as needed. ) 90 tablet 1  . Semaglutide (RYBELSUS) 14 MG TABS Take 7 mg by mouth daily.     Marland Kitchen SYNTHROID 112 MCG tablet Take 1 tablet by mouth daily before breakfast.     . Vitamin D, Ergocalciferol, (DRISDOL) 1.25 MG (50000 UNIT) CAPS capsule Take 1 capsule (50,000 Units total) by mouth every 7 (seven) days. 4 capsule 0   No current facility-administered medications on file prior to visit.     PAST SURGICAL HISTORY Past Surgical History:  Procedure Laterality Date  . COLONOSCOPY WITH PROPOFOL N/A 12/17/2018   Procedure: COLONOSCOPY WITH PROPOFOL;  Surgeon: Milus Banister, MD;  Location: [facility];  Service: Endoscopy;  Laterality: N/A;  . ECTOPIC PREGNANCY SURGERY    . POLYPECTOMY  12/17/2018   Procedure: POLYPECTOMY;  Surgeon: Milus Banister, MD;  Location: [facility];  Service: Endoscopy;;    FAMILY HISTORY: Family History  Problem Relation Age of Onset  . Arthritis Mother   . Hypertension Mother   . Heart disease Mother   . Anxiety disorder Mother   . Thyroid disease Mother   . Obesity Mother   . Aneurysm Father   . Sudden death Father   . Hyperlipidemia Other   . Hypertension Other   . Diabetes Maternal Grandfather   . Coronary artery disease Other     SOCIAL HISTORY:  reports that she has quit smoking. She has never used smokeless tobacco. She reports current alcohol use. She reports that she does not use drugs.  PERFORMANCE STATUS: The patient's performance status is 0  - Asymptomatic  PHYSICAL EXAM: Most Recent Vital Signs: Blood pressure (!) 144/78, pulse 77, temperature 98.2 F (36.8 C), temperature source Oral, resp. rate 17, height 5' 5.5" (1.664 m), weight 298 lb 4.8 oz (135.3 kg), SpO2 100 %. BP (!) 144/78 (BP Location: Left Arm, Patient Position: Sitting)   Pulse 77   Temp 98.2 F (36.8 C) (Oral)   Resp 17   Ht 5' 5.5" (1.664 m)   Wt 298 lb 4.8 oz (135.3 kg)   SpO2 100%   BMI 48.88 kg/m   General  Appearance:    Alert, cooperative, no distress, appears stated age  Head:    Normocephalic, without obvious abnormality, atraumatic  Eyes:    PERRL, conjunctiva/corneas clear, EOM's intact, fundi    benign, both eyes        Throat:   Lips, mucosa, and tongue normal; teeth and gums normal  Neck:   Supple, symmetrical, trachea midline, no adenopathy;    thyroid:  no enlargement/tenderness/nodules; no carotid   bruit or JVD  Back:     Symmetric, no curvature, ROM normal, no CVA tenderness  Lungs:     Clear to auscultation bilaterally, respirations unlabored  Chest Wall:    No tenderness or deformity   Heart:    Regular rate and rhythm, S1 and S2 normal, no murmur, rub   or gallop     Abdomen:     Soft, non-tender, bowel sounds active all four quadrants,    no masses, no organomegaly        Extremities:   Extremities normal, atraumatic, no cyanosis or edema  Pulses:   2+ and symmetric all extremities  Skin:   Skin color, texture, turgor normal, no rashes or lesions  Lymph nodes:   Cervical, supraclavicular, and axillary nodes normal  Neurologic:   CNII-XII intact, normal strength, sensation and reflexes    throughout    LABORATORY DATA:  Results for orders placed or performed in visit on 04/11/20 (from the past 48 hour(s))  CBC with Differential (Cancer Center Only)     Status: None   Collection Time: 04/11/20 10:15 AM  Result Value Ref Range   WBC Count 5.4 4.0 - 10.5 K/uL   RBC 4.53 3.87 - 5.11 MIL/uL   Hemoglobin 14.0 12.0 - 15.0  g/dL   HCT 41.9 36 - 46 %   MCV 92.5 80.0 - 100.0 fL   MCH 30.9 26.0 - 34.0 pg   MCHC 33.4 30.0 - 36.0 g/dL   RDW 14.2 11.5 - 15.5 %   Platelet Count 245 150 - 400 K/uL   nRBC 0.0 0.0 - 0.2 %   Neutrophils Relative % 59 %   Neutro Abs 3.2 1.7 - 7.7 K/uL   Lymphocytes Relative 31 %   Lymphs Abs 1.7 0.7 - 4.0 K/uL   Monocytes Relative 7 %   Monocytes Absolute 0.4 0 - 1 K/uL   Eosinophils Relative 2 %   Eosinophils Absolute 0.1 0 - 0 K/uL   Basophils Relative 1 %   Basophils Absolute 0.0 0 - 0 K/uL   Immature Granulocytes 0 %   Abs Immature Granulocytes 0.01 0.00 - 0.07 K/uL    Comment: Performed at [facility] Lab at [facility], [address] [place], [place] [address]  [facility])     Status: None   Collection Time: 04/11/20 10:15 AM  Result Value Ref Range   Smear Review SMEAR STAINED AND AVAILABLE FOR REVIEW     Comment: Performed at [facility] at [facility], [address] [place], [place] [address]  CMP ([facility] only)     Status: Abnormal   Collection Time: 04/11/20 10:15 AM  Result Value Ref Range   Sodium 140 135 - 145 mmol/L   Potassium 3.7 3.5 - 5.1 mmol/L   Chloride 104 98 - 111 mmol/L   CO2 28 22 - 32 mmol/L   Glucose, Bld 103 (H) 70 - 99 mg/dL    Comment: Glucose reference range applies  only to samples taken after fasting for at least 8 hours.   BUN 12 8 - 23 mg/dL   Creatinine 0.86 0.44 - 1.00 mg/dL   Calcium 9.4 8.9 - 10.3 mg/dL   Total Protein 7.7 6.5 - 8.1 g/dL   Albumin 4.3 3.5 - 5.0 g/dL   AST 13 (L) 15 - 41 U/L   ALT 15 0 - 44 U/L   Alkaline Phosphatase 74 38 - 126 U/L   Total Bilirubin 0.4 0.3 - 1.2 mg/dL   GFR, Est Non Af Am >60 >60 mL/min   GFR, Est AFR Am >60 >60 mL/min   Anion gap 8 5 - 15    Comment: Performed at [facility] at [facility], [address] [place], [place] [address]      RADIOGRAPHY: No results found.     PATHOLOGY:  None  ASSESSMENT/PLAN: Tracey Page is a very pleasant 64 yo African [place] female here today for an elevated ferritin level (266) which is felt to be reactive. Iron saturation is stable at 19% and Hgb 14.0.  She is doing well, asymptomatic and has no complaints. Hemochromatosis DNA was negative.   All questions were answered and she is in agreement with the plab. She can contact our office with any questions or concerns.   She was discussed with and also seen by Dr. Marin Olp and he is in agreement with the aforementioned.   Laverna Peace, NP   ADDENDUM: I saw and examined Tracey Page with Judson Roch.  I agree with the above assessment.  There is no evidence of iron overload.  With an iron saturation of only 17%, there is no way that she can have iron overload.  I suspect the elevated ferritin is more an acute inflammatory reactant.  There is nothing to suggest hemochromatosis.  I cannot recall ever seeing a case of hemochromatosis in the African-American population.  She is very nice.  It was enjoyable talking with her.  We spent about 45 minutes with her today.  We explained why there is no evidence of iron overload.  We told her that she could always donate blood to the [employer].  She certainly is not anemic.  By donating blood to the [employer], if there was any issue with iron excess, this would take care of it.  She says that she will consider this.  Hopefully she will donate blood as this would be able to help others.  We really do not have to see her back in the office.  Again she was very very nice.  Lattie Haw, MD  Psalms 16:8-9

## 2020-04-17 LAB — HEMOCHROMATOSIS DNA-PCR(C282Y,H63D)

## 2020-04-18 ENCOUNTER — Other Ambulatory Visit: Payer: Self-pay | Admitting: Family Medicine

## 2020-04-18 DIAGNOSIS — E785 Hyperlipidemia, unspecified: Secondary | ICD-10-CM

## 2020-04-19 ENCOUNTER — Encounter (INDEPENDENT_AMBULATORY_CARE_PROVIDER_SITE_OTHER): Payer: Self-pay | Admitting: Family Medicine

## 2020-04-19 ENCOUNTER — Ambulatory Visit (INDEPENDENT_AMBULATORY_CARE_PROVIDER_SITE_OTHER): Payer: 59 | Admitting: Family Medicine

## 2020-04-19 ENCOUNTER — Other Ambulatory Visit: Payer: Self-pay

## 2020-04-19 VITALS — BP 119/74 | HR 75 | Temp 98.1°F | Ht 65.0 in | Wt 293.0 lb

## 2020-04-19 DIAGNOSIS — E559 Vitamin D deficiency, unspecified: Secondary | ICD-10-CM

## 2020-04-19 DIAGNOSIS — Z6841 Body Mass Index (BMI) 40.0 and over, adult: Secondary | ICD-10-CM | POA: Diagnosis not present

## 2020-04-19 MED ORDER — VITAMIN D (ERGOCALCIFEROL) 1.25 MG (50000 UNIT) PO CAPS: 50000.0000 [IU] | ORAL_CAPSULE | ORAL | 0 refills | Status: DC

## 2020-04-20 NOTE — Progress Notes (Signed)
Chief Complaint:   OBESITY Tracey Page is here to discuss her progress with her obesity treatment plan along with follow-up of her obesity related diagnoses. Tracey Page is on the [employer] and states she is following her eating plan approximately 60% of the time. Tracey Page states she is walking a little bit.   Today's visit was #: 13 Starting weight: 308 lbs Starting date: 06/02/2019 Today's weight: 293 lbs Today's date: 04/19/2020 Total lbs lost to date: 15 Total lbs lost since last in-office visit: 2  Interim History: Tracey Page continues to lose weight but she is not eating all of the food on her plan. Instead, she is trying to be mindful and portion control and make smart choices, as well as decrease simple carbohydrates.  Subjective:   1. Vitamin D deficiency Tracey Page is stable on Vit D, and she requests a refill today. Her Vit D level is not yet at goal.  Assessment/Plan:   1. Vitamin D deficiency Low Vitamin D level contributes to fatigue and are associated with obesity, breast, and colon cancer. We will refill prescription Vitamin D for 1 month. Tracey Page will follow-up for routine testing of Vitamin D, at least 2-3 times per year to avoid over-replacement.  - Vitamin D, Ergocalciferol, (DRISDOL) 1.25 MG (50000 UNIT) CAPS capsule; Take 1 capsule (50,000 Units total) by mouth every 7 (seven) days.  Dispense: 4 capsule; Refill: 0  2. Class 3 severe obesity with serious comorbidity and body mass index (BMI) of 45.0 to 49.9 in adult, unspecified obesity type (Tracey Page) Tracey Page is currently in the action stage of change. As such, her goal is to continue with weight loss efforts. She has agreed to the [employer].   Exercise goals: As is.  Behavioral modification strategies: increasing lean protein intake and better snacking choices.  Tracey Page has agreed to follow-up with our clinic in 3 weeks. She was informed of the importance of frequent follow-up visits to maximize her success with intensive  lifestyle modifications for her multiple health conditions.   Objective:   Blood pressure 119/74, pulse 75, temperature 98.1 F (36.7 C), temperature source Oral, height 5\' 5"  (1.651 m), weight 293 lb (132.9 kg), SpO2 99 %. Body mass index is 48.76 kg/m.  General: Cooperative, alert, well developed, in no acute distress. HEENT: Conjunctivae and lids unremarkable. Cardiovascular: Regular rhythm.  Lungs: Normal work of breathing. Neurologic: No focal deficits.   Lab Results  Component Value Date   CREATININE 0.86 04/11/2020   BUN 12 04/11/2020   NA 140 04/11/2020   K 3.7 04/11/2020   CL 104 04/11/2020   CO2 28 04/11/2020   Lab Results  Component Value Date   ALT 15 04/11/2020   AST 13 (L) 04/11/2020   ALKPHOS 74 04/11/2020   BILITOT 0.4 04/11/2020   Lab Results  Component Value Date   HGBA1C 5.9 (H) 02/23/2020   HGBA1C 6.1 (H) 11/24/2019   HGBA1C 6.4 07/25/2017   Lab Results  Component Value Date   INSULIN 18.3 02/23/2020   INSULIN 31.1 (H) 11/24/2019   INSULIN 22.0 06/02/2019   Lab Results  Component Value Date   TSH 0.500 06/02/2019   Lab Results  Component Value Date   CHOL 162 02/23/2020   HDL 43 02/23/2020   LDLCALC 104 (H) 02/23/2020   LDLDIRECT 179.1 09/16/2012   TRIG 76 02/23/2020   CHOLHDL 3.6 06/02/2019   Lab Results  Component Value Date   WBC 5.4 04/11/2020   HGB 14.0 04/11/2020   HCT 41.9  04/11/2020   MCV 92.5 04/11/2020   PLT 245 04/11/2020   Lab Results  Component Value Date   IRON 52 04/11/2020   TIBC 308 04/11/2020   FERRITIN 196 04/11/2020   Attestation Statements:   Reviewed by clinician on day of visit: allergies, medications, problem list, medical history, surgical history, family history, social history, and previous encounter notes.  Time spent on visit including pre-visit chart review and post-visit care and charting was 31 minutes.    I, Trixie Dredge, am acting as transcriptionist for Dennard Nip, MD.  I have  reviewed the above documentation for accuracy and completeness, and I agree with the above. -  Dennard Nip, MD

## 2020-05-17 ENCOUNTER — Other Ambulatory Visit: Payer: Self-pay

## 2020-05-17 ENCOUNTER — Encounter (INDEPENDENT_AMBULATORY_CARE_PROVIDER_SITE_OTHER): Payer: Self-pay | Admitting: Family Medicine

## 2020-05-17 ENCOUNTER — Ambulatory Visit (INDEPENDENT_AMBULATORY_CARE_PROVIDER_SITE_OTHER): Payer: 59 | Admitting: Family Medicine

## 2020-05-17 VITALS — BP 124/79 | HR 71 | Temp 97.9°F | Ht 65.0 in | Wt 293.0 lb

## 2020-05-17 DIAGNOSIS — E1169 Type 2 diabetes mellitus with other specified complication: Secondary | ICD-10-CM

## 2020-05-17 DIAGNOSIS — E559 Vitamin D deficiency, unspecified: Secondary | ICD-10-CM | POA: Diagnosis not present

## 2020-05-17 DIAGNOSIS — Z9189 Other specified personal risk factors, not elsewhere classified: Secondary | ICD-10-CM | POA: Diagnosis not present

## 2020-05-17 DIAGNOSIS — Z6841 Body Mass Index (BMI) 40.0 and over, adult: Secondary | ICD-10-CM

## 2020-05-17 MED ORDER — VITAMIN D (ERGOCALCIFEROL) 1.25 MG (50000 UNIT) PO CAPS: 50000.0000 [IU] | ORAL_CAPSULE | ORAL | 0 refills | Status: DC

## 2020-05-18 NOTE — Progress Notes (Signed)
Chief Complaint:   OBESITY Tracey Page is here to discuss her progress with her obesity treatment plan along with follow-up of her obesity related diagnoses. Tracey Page is on the [employer] and states she is following her eating plan approximately 60% of the time. Tracey Page states she is walking for 15 minutes 2 times per week.  Today's visit was #: 14 Starting weight: 308 lbs Starting date: 06/02/2019 Today's weight: 292 lbs Today's date: 05/17/2020 Total lbs lost to date: 16 Total lbs lost since last in-office visit: 1  Interim History: Tracey Page continues to do well with weight loss. She isn't always eating all her food at times and may be decreasing her RMR.  Subjective:   1. Vitamin D deficiency Bobby is stable on Vit D and she denies nausea or vomiting.  2. Type 2 diabetes mellitus with other specified complication, without long-term current use of insulin ([place]) Karleen is stable on Rybelsus, but she doesn't always take it everyday as it has increased nausea especially if she waits >30 minutes to eat.  3. At risk for impaired metabolic function Linnaea is at increased risk for impaired metabolic function due to skipped meals.  Assessment/Plan:   1. Vitamin D deficiency Low Vitamin D level contributes to fatigue and are associated with obesity, breast, and colon cancer. We will refill prescription Vitamin D for 1 month. Corretta will follow-up for routine testing of Vitamin D, at least 2-3 times per year to avoid over-replacement.  - Vitamin D, Ergocalciferol, (DRISDOL) 1.25 MG (50000 UNIT) CAPS capsule; Take 1 capsule (50,000 Units total) by mouth every 7 (seven) days.  Dispense: 4 capsule; Refill: 0  2. Type 2 diabetes mellitus with other specified complication, without long-term current use of insulin (H[facility]) [name], neuropathy, limb loss, blindness, coronary artery disease, and death.  Intensive lifestyle modification including diet, exercise and weight loss are the first line of treatment for diabetes. Fantasia is to take levothyroxine first, wait 30 minutes then take Rybelsus, and then wait 30 minutes and then eat breakfast.  3. At risk for impaired metabolic function Tracey Page was given approximately 15 minutes of impaired  metabolic function prevention counseling today. We discussed intensive lifestyle modifications today with an emphasis on specific nutrition and exercise instructions and strategies.   Repetitive spaced learning was employed today to elicit superior memory formation and behavioral change.  4. Class 3 severe obesity with serious comorbidity and body mass index (BMI) of 45.0 to 49.9 in adult, unspecified obesity type ([place]) Tracey Page is currently in the action stage of change. As such, her goal is to continue with weight loss efforts. She has agreed to the [employer].   Exercise goals: As is.  Behavioral modification strategies: no skipping meals.  Tracey Page has agreed to follow-up with our clinic in 3 weeks. She was informed of the importance of frequent follow-up visits to maximize her success with intensive lifestyle modifications for her multiple health conditions.   Objective:   Blood pressure 124/79, pulse 71, temperature 97.9 F (36.6 C), temperature source Oral, height 5\' 5"  (1.651 m), weight 293 lb (132.9 kg), SpO2 98 %. Body mass index is 48.76 kg/m.  General: Cooperative, alert, well developed, in no acute distress. HEENT: Conjunctivae and lids unremarkable. Cardiovascular: Regular rhythm.  Lungs: Normal work of breathing. Neurologic: No focal deficits.   Lab Results  Component Value Date   CREATININE 0.86 04/11/2020   BUN 12  04/11/2020   NA 140 04/11/2020   K 3.7 04/11/2020   CL 104 04/11/2020   CO2 28 04/11/2020   Lab Results  Component Value Date   ALT 15 04/11/2020   AST 13 (L) 04/11/2020   ALKPHOS 74 04/11/2020   BILITOT 0.4  04/11/2020   Lab Results  Component Value Date   HGBA1C 5.9 (H) 02/23/2020   HGBA1C 6.1 (H) 11/24/2019   HGBA1C 6.4 07/25/2017   Lab Results  Component Value Date   INSULIN 18.3 02/23/2020   INSULIN 31.1 (H) 11/24/2019   INSULIN 22.0 06/02/2019   Lab Results  Component Value Date   TSH 0.500 06/02/2019   Lab Results  Component Value Date   CHOL 162 02/23/2020   HDL 43 02/23/2020   LDLCALC 104 (H) 02/23/2020   LDLDIRECT 179.1 09/16/2012   TRIG 76 02/23/2020   CHOLHDL 3.6 06/02/2019   Lab Results  Component Value Date   WBC 5.4 04/11/2020   HGB 14.0 04/11/2020   HCT 41.9 04/11/2020   MCV 92.5 04/11/2020   PLT 245 04/11/2020   Lab Results  Component Value Date   IRON 52 04/11/2020   TIBC 308 04/11/2020   FERRITIN 196 04/11/2020   Attestation Statements:   Reviewed by clinician on day of visit: allergies, medications, problem list, medical history, surgical history, family history, social history, and previous encounter notes.   I, Trixie Dredge, am acting as transcriptionist for Dennard Nip, MD.  I have reviewed the above documentation for accuracy and completeness, and I agree with the above. -  Dennard Nip, MD

## 2020-06-14 ENCOUNTER — Ambulatory Visit (INDEPENDENT_AMBULATORY_CARE_PROVIDER_SITE_OTHER): Payer: 59 | Admitting: Family Medicine

## 2020-06-16 ENCOUNTER — Ambulatory Visit: Payer: Self-pay | Attending: Internal Medicine

## 2020-06-16 DIAGNOSIS — Z23 Encounter for immunization: Secondary | ICD-10-CM

## 2020-06-16 NOTE — Progress Notes (Signed)
° °  Covid-19 Vaccination Clinic  Name:  Tracey Page    MRN: [id number] DOB: [birth year]  06/16/2020  Tracey Page was observed post Covid-19 immunization for 30 minutes based on pre-vaccination screening without incident. She was provided with Vaccine Information Sheet and instruction to access the V-Safe system.   Tracey Page was instructed to call 911 with any severe reactions post vaccine:  Difficulty breathing   Swelling of face and throat   A fast heartbeat   A bad rash all over body   Dizziness and weakness

## 2020-06-23 ENCOUNTER — Other Ambulatory Visit: Payer: Self-pay | Admitting: Family Medicine

## 2020-06-23 DIAGNOSIS — E1165 Type 2 diabetes mellitus with hyperglycemia: Secondary | ICD-10-CM | POA: Diagnosis not present

## 2020-06-23 DIAGNOSIS — I10 Essential (primary) hypertension: Secondary | ICD-10-CM

## 2020-06-23 DIAGNOSIS — E7800 Pure hypercholesterolemia, unspecified: Secondary | ICD-10-CM | POA: Diagnosis not present

## 2020-06-23 DIAGNOSIS — E039 Hypothyroidism, unspecified: Secondary | ICD-10-CM | POA: Diagnosis not present

## 2020-06-27 ENCOUNTER — Other Ambulatory Visit: Payer: Self-pay | Admitting: Family Medicine

## 2020-06-27 DIAGNOSIS — E785 Hyperlipidemia, unspecified: Secondary | ICD-10-CM

## 2020-06-30 ENCOUNTER — Encounter: Payer: 59 | Admitting: Family Medicine

## 2020-06-30 DIAGNOSIS — Z23 Encounter for immunization: Secondary | ICD-10-CM | POA: Diagnosis not present

## 2020-06-30 DIAGNOSIS — Z91013 Allergy to seafood: Secondary | ICD-10-CM | POA: Diagnosis not present

## 2020-06-30 DIAGNOSIS — E039 Hypothyroidism, unspecified: Secondary | ICD-10-CM | POA: Diagnosis not present

## 2020-06-30 DIAGNOSIS — E1165 Type 2 diabetes mellitus with hyperglycemia: Secondary | ICD-10-CM | POA: Diagnosis not present

## 2020-06-30 DIAGNOSIS — I10 Essential (primary) hypertension: Secondary | ICD-10-CM | POA: Diagnosis not present

## 2020-07-24 DIAGNOSIS — Z6841 Body Mass Index (BMI) 40.0 and over, adult: Secondary | ICD-10-CM | POA: Diagnosis not present

## 2020-07-24 DIAGNOSIS — Z01419 Encounter for gynecological examination (general) (routine) without abnormal findings: Secondary | ICD-10-CM | POA: Diagnosis not present

## 2020-07-24 DIAGNOSIS — N950 Postmenopausal bleeding: Secondary | ICD-10-CM | POA: Diagnosis not present

## 2020-07-24 DIAGNOSIS — E669 Obesity, unspecified: Secondary | ICD-10-CM | POA: Diagnosis not present

## 2020-07-24 DIAGNOSIS — Z130 Encounter for screening for diseases of the blood and blood-forming organs and certain disorders involving the immune mechanism: Secondary | ICD-10-CM | POA: Diagnosis not present

## 2020-07-24 DIAGNOSIS — Z124 Encounter for screening for malignant neoplasm of cervix: Secondary | ICD-10-CM | POA: Diagnosis not present

## 2020-07-24 DIAGNOSIS — Z1389 Encounter for screening for other disorder: Secondary | ICD-10-CM | POA: Diagnosis not present

## 2020-08-03 ENCOUNTER — Other Ambulatory Visit: Payer: Self-pay | Admitting: Family Medicine

## 2020-08-03 DIAGNOSIS — I10 Essential (primary) hypertension: Secondary | ICD-10-CM

## 2020-08-03 DIAGNOSIS — R601 Generalized edema: Secondary | ICD-10-CM

## 2020-08-07 DIAGNOSIS — N950 Postmenopausal bleeding: Secondary | ICD-10-CM | POA: Diagnosis not present

## 2020-08-07 DIAGNOSIS — Z1231 Encounter for screening mammogram for malignant neoplasm of breast: Secondary | ICD-10-CM | POA: Diagnosis not present

## 2020-08-07 DIAGNOSIS — N840 Polyp of corpus uteri: Secondary | ICD-10-CM | POA: Diagnosis not present

## 2020-08-11 ENCOUNTER — Encounter: Payer: Self-pay | Admitting: Family Medicine

## 2020-08-11 ENCOUNTER — Ambulatory Visit (INDEPENDENT_AMBULATORY_CARE_PROVIDER_SITE_OTHER): Payer: 59 | Admitting: Family Medicine

## 2020-08-11 ENCOUNTER — Other Ambulatory Visit: Payer: Self-pay

## 2020-08-11 VITALS — BP 138/80 | HR 61 | Temp 97.5°F | Resp 18 | Ht 65.0 in | Wt 296.2 lb

## 2020-08-11 DIAGNOSIS — E559 Vitamin D deficiency, unspecified: Secondary | ICD-10-CM | POA: Diagnosis not present

## 2020-08-11 DIAGNOSIS — J302 Other seasonal allergic rhinitis: Secondary | ICD-10-CM | POA: Diagnosis not present

## 2020-08-11 DIAGNOSIS — I890 Lymphedema, not elsewhere classified: Secondary | ICD-10-CM

## 2020-08-11 DIAGNOSIS — E1165 Type 2 diabetes mellitus with hyperglycemia: Secondary | ICD-10-CM

## 2020-08-11 DIAGNOSIS — Z0000 Encounter for general adult medical examination without abnormal findings: Secondary | ICD-10-CM | POA: Diagnosis not present

## 2020-08-11 MED ORDER — AZELASTINE HCL 0.1 % NA SOLN: 1.0000 | Freq: Two times a day (BID) | NASAL | 12 refills | Status: DC

## 2020-08-11 MED ORDER — VITAMIN D (ERGOCALCIFEROL) 1.25 MG (50000 UNIT) PO CAPS: 50000.0000 [IU] | ORAL_CAPSULE | ORAL | 3 refills | Status: DC

## 2020-08-11 NOTE — Progress Notes (Signed)
Subjective:     Tracey Page is a 64 y.o. female and is here for a comprehensive physical exam. The patient reports no problems.  Social History   Socioeconomic History  . Marital status: Single    Spouse name: Not on file  . Number of children: Not on file  . Years of education: Not on file  . Highest education level: Not on file  Occupational History  . Not on file  Tobacco Use  . Smoking status: Former Research scientist (life sciences)  . Smokeless tobacco: Never Used  Vaping Use  . Vaping Use: Never used  Substance and Sexual Activity  . Alcohol use: Yes    Comment: socially, not even on weekly basis  . Drug use: No  . Sexual activity: Yes    Birth control/protection: Post-menopausal  Other Topics Concern  . Not on file  Social History Narrative  . Not on file   Social Determinants of Health   Financial Resource Strain:   . Difficulty of Paying Living Expenses: Not on file  Food Insecurity:   . Worried About Charity fundraiser in the Last Year: Not on file  . Ran Out of Food in the Last Year: Not on file  Transportation Needs:   . Lack of Transportation (Medical): Not on file  . Lack of Transportation (Non-Medical): Not on file  Physical Activity:   . Days of Exercise per Week: Not on file  . Minutes of Exercise per Session: Not on file  Stress:   . Feeling of Stress : Not on file  Social Connections:   . Frequency of Communication with Friends and Family: Not on file  . Frequency of Social Gatherings with Friends and Family: Not on file  . Attends Religious Services: Not on file  . Active Member of Clubs or Organizations: Not on file  . Attends Archivist Meetings: Not on file  . Marital Status: Not on file  Intimate Partner Violence:   . Fear of Current or Ex-Partner: Not on file  . Emotionally Abused: Not on file  . Physically Abused: Not on file  . Sexually Abused: Not on file   Health Maintenance  Topic Date Due  . FOOT EXAM  Never done  . OPHTHALMOLOGY EXAM   Never done  . URINE MICROALBUMIN  07/13/2016  . COVID-19 Vaccine (2 - Pfizer 2-dose series) 07/07/2020  . HEMOGLOBIN A1C  08/25/2020  . MAMMOGRAM  07/31/2021  . PAP SMEAR-Modifier  08/01/2023  . TETANUS/TDAP  04/20/2024  . COLONOSCOPY  12/16/2028  . INFLUENZA VACCINE  Completed  . Hepatitis C Screening  Completed  . HIV Screening  Completed    The following portions of the patient's history were reviewed and updated as appropriate:  She  has a past medical history of Anxiety, Bilateral swelling of feet and ankles, Diverticulitis, Food allergy, Hyperlipidemia, Hypertension, Hypothyroidism, Lymphedema, Obesity, Pre-diabetes, and Thyroid disease. She does not have any pertinent problems on file. She  has a past surgical history that includes Ectopic pregnancy surgery; Colonoscopy with propofol (N/A, 12/17/2018); and polypectomy (12/17/2018). Her family history includes Aneurysm in her father; Anxiety disorder in her mother; Arthritis in her mother; Coronary artery disease in an other family member; Diabetes in her maternal grandfather; Heart disease in her mother; Hyperlipidemia in an other family member; Hypertension in her mother and another family member; Obesity in her mother; Sudden death in her father; Thyroid disease in her mother. She  reports that she has quit smoking. She has  never used smokeless tobacco. She reports current alcohol use. She reports that she does not use drugs. She has a current medication list which includes the following prescription(s): alprazolam, amlodipine, atorvastatin, azelastine, blood glucose meter kit and supplies, one touch ultra 2, epinephrine, fluticasone, furosemide, onetouch ultra, levocetirizine, liothyronine, metoprolol succinate, NONFORMULARY OR COMPOUNDED ITEM, onetouch delica lancets 12W, pantoprazole, semaglutide, synthroid, and vitamin d (ergocalciferol). Current Outpatient Medications on File Prior to Visit  Medication Sig Dispense Refill  .  ALPRAZolam (XANAX) 0.25 MG tablet TAKE ONE TABLET BY MOUTH TWICE DAILY AS NEEDED FOR ANXIETY 45 tablet 0  . amLODipine (NORVASC) 10 MG tablet Take 1 tablet (10 mg total) by mouth daily. 90 tablet 1  . atorvastatin (LIPITOR) 20 MG tablet Take 1 tablet by mouth once daily 90 tablet 0  . azelastine (ASTELIN) 0.1 % nasal spray Place 1 spray into both nostrils 2 (two) times daily. Use in each nostril as directed 30 mL 12  . blood glucose meter kit and supplies KIT Dispense based on patient and insurance preference. Use up to four times daily as directed. (FOR ICD-9 250.00, 250.01). 1 each 0  . Blood Glucose Monitoring Suppl (ONE TOUCH ULTRA 2) w/Device KIT 1 kit by Does not apply route daily. 1 kit 0  . EPINEPHrine 0.3 mg/0.3 mL IJ SOAJ injection INJECT CONTENTS OF 1 PEN AS NEEDED FOR ALLERGIC REACTION    . fluticasone (FLONASE) 50 MCG/ACT nasal spray Place 2 sprays into both nostrils daily. 16 g 6  . furosemide (LASIX) 20 MG tablet Take 1-2 tablets (20-40 mg total) by mouth daily as needed for edema. 180 tablet 0  . glucose blood (ONETOUCH ULTRA) test strip Use as instructed 100 each 0  . levocetirizine (XYZAL) 5 MG tablet Take 1 tablet (5 mg total) by mouth every evening. 30 tablet 5  . liothyronine (CYTOMEL) 5 MCG tablet Take 5 mcg by mouth daily.    . metoprolol succinate (TOPROL-XL) 50 MG 24 hr tablet TAKE 3 TABLETS BY MOUTH ONCE DAILY IMMEDIATELY  FOLLOWING  A  MEAL 270 tablet 0  . NONFORMULARY OR COMPOUNDED ITEM Compression socks  30-40 mmhg  #1  As directed 1 each 0  . OneTouch Delica Lancets 58K MISC 1 each by Does not apply route daily. 100 each 0  . pantoprazole (PROTONIX) 20 MG tablet Take 1 tablet (20 mg total) by mouth daily. (Patient taking differently: Take 20 mg by mouth daily as needed. ) 90 tablet 1  . Semaglutide (RYBELSUS) 14 MG TABS Take 7 mg by mouth daily.     Marland Kitchen SYNTHROID 112 MCG tablet Take 1 tablet by mouth daily before breakfast.      No current facility-administered  medications on file prior to visit.   She is allergic to diovan [valsartan], iodine, lisinopril, shellfish allergy, and sulfonamide derivatives..  Review of Systems Review of Systems  Constitutional: Negative for activity change, appetite change and fatigue.  HENT: Negative for hearing loss, congestion, tinnitus and ear discharge.  dentist q50mEyes: Negative for visual disturbance (see optho q1y -- vision corrected to 20/20 with glasses).  Respiratory: Negative for cough, chest tightness and shortness of breath.   Cardiovascular: Negative for chest pain, palpitations and leg swelling.  Gastrointestinal: Negative for abdominal pain, diarrhea, constipation and abdominal distention.  Genitourinary: Negative for urgency, frequency, decreased urine volume and difficulty urinating.  Musculoskeletal: Negative for back pain, arthralgias and gait problem.  Skin: Negative for color change, pallor and rash.  Neurological: Negative for dizziness, light-headedness,  numbness and headaches.  Hematological: Negative for adenopathy. Does not bruise/bleed easily.  Psychiatric/Behavioral: Negative for suicidal ideas, confusion, sleep disturbance, self-injury, dysphoric mood, decreased concentration and agitation.       Objective:    BP 138/80 (BP Location: Left Arm, Patient Position: Sitting, Cuff Size: Large)   Pulse 61   Temp (!) 97.5 F (36.4 C) (Oral)   Resp 18   Ht '5\' 5"'  (1.651 m)   Wt 296 lb 3.2 oz (134.4 kg)   SpO2 98%   BMI 49.29 kg/m  General appearance: alert, cooperative, appears older than stated age and no distress Head: Normocephalic, without obvious abnormality, atraumatic Eyes: conjunctivae/corneas clear. PERRL, EOM's intact. Fundi benign. Ears: normal TM's and external ear canals both ears Neck: no adenopathy, no carotid bruit, no JVD, supple, symmetrical, trachea midline and thyroid not enlarged, symmetric, no tenderness/mass/nodules Back: symmetric, no curvature. ROM normal.  No CVA tenderness. Lungs: clear to auscultation bilaterally Breasts: gyn Heart: regular rate and rhythm, S1, S2 normal, no murmur, click, rub or gallop Abdomen: soft, non-tender; bowel sounds normal; no masses,  no organomegaly Pelvic: deferredgyn Extremities: edema nonpitting Pulses: 2+ and symmetric Skin: Skin color, texture, turgor normal. No rashes or lesions Lymph nodes: Cervical, supraclavicular, and axillary nodes normal. Neurologic: Alert and oriented X 3, normal strength and tone. Normal symmetric reflexes. Normal coordination and gait    Assessment:    Healthy female exam.      Plan:     ghm utd Check labs See After Visit Summary for Counseling Recommendations    1. Vitamin D deficiency Check labs  - Vitamin D, Ergocalciferol, (DRISDOL) 1.25 MG (50000 UNIT) CAPS capsule; Take 1 capsule (50,000 Units total) by mouth every 7 (seven) days.  Dispense: 12 capsule; Refill: 3 - Lipid panel - Comprehensive metabolic panel - CBC with Differential/Platelet - Insulin, random - Vitamin D (25 hydroxy)  2. Lymphedema   - Ambulatory referral to Physical Therapy  3. Preventative health care See above  - Lipid panel - Comprehensive metabolic panel - CBC with Differential/Platelet - Insulin, random - Vitamin D (25 hydroxy)  4. Seasonal allergies   - azelastine (ASTELIN) 0.1 % nasal spray; Place 1 spray into both nostrils 2 (two) times daily. Use in each nostril as directed  Dispense: 30 mL; Refill: 12  5. Morbid obesity ([place]) Check labs con't to follow diet  - Lipid panel - Comprehensive metabolic panel - CBC with Differential/Platelet - Insulin, random - Vitamin D (25 hydroxy)  6. Type 2 diabetes mellitus with hyperglycemia, without long-term current use of insulin ([facility]) Per endo - Lipid panel - Comprehensive metabolic panel - CBC with Differential/Platelet - Insulin, random - Vitamin D (25 hydroxy)

## 2020-08-11 NOTE — Patient Instructions (Signed)

## 2020-08-12 LAB — COMPREHENSIVE METABOLIC PANEL
AG Ratio: 1.4 (calc) (ref 1.0–2.5)
ALT: 19 U/L (ref 6–29)
AST: 14 U/L (ref 10–35)
Albumin: 3.9 g/dL (ref 3.6–5.1)
Alkaline phosphatase (APISO): 80 U/L (ref 37–153)
BUN: 8 mg/dL (ref 7–25)
CO2: 25 mmol/L (ref 20–32)
Calcium: 8.8 mg/dL (ref 8.6–10.4)
Chloride: 107 mmol/L (ref 98–110)
Creat: 0.65 mg/dL (ref 0.50–0.99)
Globulin: 2.8 g/dL (calc) (ref 1.9–3.7)
Glucose, Bld: 87 mg/dL (ref 65–99)
Potassium: 3.8 mmol/L (ref 3.5–5.3)
Sodium: 144 mmol/L (ref 135–146)
Total Bilirubin: 0.3 mg/dL (ref 0.2–1.2)
Total Protein: 6.7 g/dL (ref 6.1–8.1)

## 2020-08-12 LAB — CBC WITH DIFFERENTIAL/PLATELET
Absolute Monocytes: 413 cells/uL (ref 200–950)
Basophils Absolute: 29 cells/uL (ref 0–200)
Basophils Relative: 0.6 %
Eosinophils Absolute: 101 cells/uL (ref 15–500)
Eosinophils Relative: 2.1 %
HCT: 41.5 % (ref 35.0–45.0)
Hemoglobin: 13.9 g/dL (ref 11.7–15.5)
Lymphs Abs: 2030 cells/uL (ref 850–3900)
MCH: 31 pg (ref 27.0–33.0)
MCHC: 33.5 g/dL (ref 32.0–36.0)
MCV: 92.6 fL (ref 80.0–100.0)
MPV: 12.1 fL (ref 7.5–12.5)
Monocytes Relative: 8.6 %
Neutro Abs: 2227 cells/uL (ref 1500–7800)
Neutrophils Relative %: 46.4 %
Platelets: 218 10*3/uL (ref 140–400)
RBC: 4.48 10*6/uL (ref 3.80–5.10)
RDW: 13.7 % (ref 11.0–15.0)
Total Lymphocyte: 42.3 %
WBC: 4.8 10*3/uL (ref 3.8–10.8)

## 2020-08-12 LAB — LIPID PANEL
Cholesterol: 111 mg/dL (ref <200)
HDL: 34 mg/dL — ABNORMAL LOW (ref >=50)
LDL Cholesterol (Calc): 62 mg/dL (calc)
Non-HDL Cholesterol (Calc): 77 mg/dL (calc) (ref <130)
Total CHOL/HDL Ratio: 3.3 (calc) (ref <5.0)
Triglycerides: 72 mg/dL (ref <150)

## 2020-08-12 LAB — VITAMIN D 25 HYDROXY (VIT D DEFICIENCY, FRACTURES): Vit D, 25-Hydroxy: 44 ng/mL (ref 30–100)

## 2020-08-14 DIAGNOSIS — N950 Postmenopausal bleeding: Secondary | ICD-10-CM | POA: Diagnosis not present

## 2020-08-14 DIAGNOSIS — N840 Polyp of corpus uteri: Secondary | ICD-10-CM | POA: Diagnosis not present

## 2020-08-14 LAB — INSULIN, RANDOM: Insulin: 12.9 u[IU]/mL

## 2020-08-28 ENCOUNTER — Ambulatory Visit: Payer: 59 | Admitting: Family Medicine

## 2020-08-28 ENCOUNTER — Other Ambulatory Visit: Payer: Self-pay

## 2020-08-28 ENCOUNTER — Encounter: Payer: Self-pay | Admitting: Family Medicine

## 2020-08-28 VITALS — BP 126/57 | HR 52 | Temp 98.4°F | Resp 12 | Ht 65.0 in | Wt 293.8 lb

## 2020-08-28 DIAGNOSIS — D171 Benign lipomatous neoplasm of skin and subcutaneous tissue of trunk: Secondary | ICD-10-CM | POA: Diagnosis not present

## 2020-08-28 DIAGNOSIS — L918 Other hypertrophic disorders of the skin: Secondary | ICD-10-CM

## 2020-08-28 NOTE — Patient Instructions (Signed)
Skin Tag, Adult  A skin tag (acrochordon) is a soft, extra growth of skin. Most skin tags are flesh-colored and rarely bigger than a pencil eraser. They commonly form near areas where there are folds in the skin, such as the armpit or groin. Skin tags are not dangerous, and they do not spread from person to person (are not contagious). You may have one skin tag or several. Skin tags do not require treatment. However, your health care provider may recommend removal of a skin tag if it:  Gets irritated from clothing.  Bleeds.  Is visible and unsightly. Your health care provider can remove skin tags with a simple surgical procedure or a procedure that involves freezing the skin tag. Follow these instructions at home:  Watch for any changes in your skin tag. A normal skin tag does not require any other special care at home.  Take over-the-counter and prescription medicines only as told by your health care provider.  Keep all follow-up visits as told by your health care provider. This is important. Contact a health care provider if:  You have a skin tag that: ? Becomes painful. ? Changes color. ? Bleeds. ? Swells.  You develop more skin tags. This information is not intended to replace advice given to you by your health care provider. Make sure you discuss any questions you have with your health care provider. Document Revised: 09/05/2017 Document Reviewed: 10/08/2015 Elsevier Patient Education  2020 Elsevier Inc.  

## 2020-08-28 NOTE — Progress Notes (Signed)
Skin Tag Removal Procedure Note Diagnosis: inflamed skin tags Location: back Informed Consent: Discussed risks (permanent scarring, infection, pain, bleeding, bruising, redness, and recurrence of the lesion) and benefits of the procedure, as well as the alternatives. She is aware that skin tags are benign lesions, and their removal is often not considered medically necessary. Informed consent was obtained. Preparation: The area was prepared in a standard fashion. Anesthesia: Lidocaine 1% with epinephrine Procedure Details: scalpel used to perform sharp removal. Aluminum chloride was applied for hemostasis. Ointment and bandage were applied where needed. The patient tolerated the procedure well. Total number of lesions treated: 1 Plan: The patient was instructed on post-op care. Recommend OTC analgesia as needed for pain. Due to size of lesion--- it was sent for pathology Pt instructed to keep it dry 24 h and then can shower as normal

## 2020-09-19 ENCOUNTER — Other Ambulatory Visit: Payer: Self-pay | Admitting: Family Medicine

## 2020-09-19 DIAGNOSIS — I10 Essential (primary) hypertension: Secondary | ICD-10-CM

## 2020-12-12 ENCOUNTER — Other Ambulatory Visit: Payer: Self-pay | Admitting: Family Medicine

## 2020-12-12 DIAGNOSIS — I10 Essential (primary) hypertension: Secondary | ICD-10-CM

## 2020-12-12 DIAGNOSIS — E785 Hyperlipidemia, unspecified: Secondary | ICD-10-CM

## 2020-12-21 DIAGNOSIS — D751 Secondary polycythemia: Secondary | ICD-10-CM | POA: Diagnosis not present

## 2020-12-21 DIAGNOSIS — E1165 Type 2 diabetes mellitus with hyperglycemia: Secondary | ICD-10-CM | POA: Diagnosis not present

## 2020-12-21 DIAGNOSIS — E7800 Pure hypercholesterolemia, unspecified: Secondary | ICD-10-CM | POA: Diagnosis not present

## 2020-12-21 DIAGNOSIS — E039 Hypothyroidism, unspecified: Secondary | ICD-10-CM | POA: Diagnosis not present

## 2020-12-28 DIAGNOSIS — E039 Hypothyroidism, unspecified: Secondary | ICD-10-CM | POA: Diagnosis not present

## 2020-12-28 DIAGNOSIS — E1165 Type 2 diabetes mellitus with hyperglycemia: Secondary | ICD-10-CM | POA: Diagnosis not present

## 2020-12-28 DIAGNOSIS — I10 Essential (primary) hypertension: Secondary | ICD-10-CM | POA: Diagnosis not present

## 2020-12-28 DIAGNOSIS — E7800 Pure hypercholesterolemia, unspecified: Secondary | ICD-10-CM | POA: Diagnosis not present

## 2021-02-27 ENCOUNTER — Telehealth: Payer: 59 | Admitting: Family

## 2021-02-27 ENCOUNTER — Encounter: Payer: Self-pay | Admitting: Family

## 2021-02-27 ENCOUNTER — Other Ambulatory Visit: Payer: Self-pay

## 2021-02-27 VITALS — BP 128/82 | Temp 98.0°F | Ht 65.0 in | Wt 280.0 lb

## 2021-02-27 DIAGNOSIS — R197 Diarrhea, unspecified: Secondary | ICD-10-CM | POA: Diagnosis not present

## 2021-02-27 NOTE — Progress Notes (Signed)
Tracey Page is a 65 y.o. female with the following history as recorded in [employer]:  Patient Active Problem List   Diagnosis Date Noted  . Type 2 diabetes mellitus without complication, without long-term current use of insulin ([place]) 08/05/2019  . Vitamin D deficiency 08/05/2019  . Class 3 severe obesity with serious comorbidity and body mass index (BMI) of 50.0 to 59.9 in adult ([place]) 08/05/2019  . Blood in stool   . Polyp of cecum   . Diverticulosis of colon without hemorrhage   . Hemorrhoids   . Wound of right leg 07/25/2017  . Cellulitis of right lower extremity 07/10/2017  . Cellulitis of right lower leg 08/15/2015  . Severe obesity (BMI >= 40) ([place]) 10/21/2013  . Hyperlipidemia LDL goal <100 06/27/2010  . CARDIAC MURMUR 06/27/2010  . OTITIS MEDIA, SEROUS, RIGHT 06/08/2010  . DYSURIA 06/16/2009  . EDEMA 08/22/2008  . ECTOPIC PREGNANCY 04/26/2008  . MORBID OBESITY 12/10/2007  . Essential hypertension 11/17/2007  . CHEST PAIN UNSPECIFIED 11/17/2007  . Hypothyroidism 04/21/2007    Current Outpatient Medications  Medication Sig Dispense Refill  . ALPRAZolam (XANAX) 0.25 MG tablet TAKE ONE TABLET BY MOUTH TWICE DAILY AS NEEDED FOR ANXIETY 45 tablet 0  . amLODipine (NORVASC) 10 MG tablet Take 1 tablet (10 mg total) by mouth daily. 90 tablet 1  . atorvastatin (LIPITOR) 20 MG tablet Take 1 tablet (20 mg total) by mouth daily. 90 tablet 1  . azelastine (ASTELIN) 0.1 % nasal spray Place 1 spray into both nostrils 2 (two) times daily. Use in each nostril as directed 30 mL 12  . blood glucose meter kit and supplies KIT Dispense based on patient and insurance preference. Use up to four times daily as directed. (FOR ICD-9 250.00, 250.01). 1 each 0  . Blood Glucose Monitoring Suppl (ONE TOUCH ULTRA 2) w/Device KIT 1 kit by Does not apply route daily. 1 kit 0  . EPINEPHrine 0.3 mg/0.3 mL IJ SOAJ injection INJECT CONTENTS OF 1 PEN AS NEEDED FOR ALLERGIC REACTION    . furosemide (LASIX) 20  MG tablet Take 1-2 tablets (20-40 mg total) by mouth daily as needed for edema. 180 tablet 0  . glucose blood (ONETOUCH ULTRA) test strip Use as instructed 100 each 0  . levocetirizine (XYZAL) 5 MG tablet Take 1 tablet (5 mg total) by mouth every evening. 30 tablet 5  . liothyronine (CYTOMEL) 5 MCG tablet Take 5 mcg by mouth daily.    . metoprolol succinate (TOPROL-XL) 50 MG 24 hr tablet Take 3 tablets (150 mg total) by mouth daily. Take with or immediately following a meal 270 tablet 1  . NONFORMULARY OR COMPOUNDED ITEM Compression socks  30-40 mmhg  #1  As directed 1 each 0  . OneTouch Delica Lancets 66A MISC 1 each by Does not apply route daily. 100 each 0  . pantoprazole (PROTONIX) 20 MG tablet Take 1 tablet (20 mg total) by mouth daily. (Patient taking differently: Take 20 mg by mouth daily as needed.) 90 tablet 1  . SYNTHROID 112 MCG tablet Take 1 tablet by mouth daily before breakfast.     . Vitamin D, Ergocalciferol, (DRISDOL) 1.25 MG (50000 UNIT) CAPS capsule Take 1 capsule (50,000 Units total) by mouth every 7 (seven) days. 12 capsule 3  . fluticasone (FLONASE) 50 MCG/ACT nasal spray Place 2 sprays into both nostrils daily. (Patient not taking: Reported on 02/27/2021) 16 g 6  . Semaglutide 14 MG TABS Take 7 mg by mouth daily.  (  Patient not taking: Reported on 02/27/2021)     No current facility-administered medications for this visit.    Allergies: Diovan [valsartan], Iodine, Lisinopril, Shellfish allergy, and Sulfonamide derivatives  Past Medical History:  Diagnosis Date  . Anxiety   . Bilateral swelling of feet and ankles   . Diverticulitis   . Food allergy   . Hyperlipidemia   . Hypertension   . Hypothyroidism   . Lymphedema   . Obesity   . Pre-diabetes   . Thyroid disease     Past Surgical History:  Procedure Laterality Date  . COLONOSCOPY WITH PROPOFOL N/A 12/17/2018   Procedure: COLONOSCOPY WITH PROPOFOL;  Surgeon: Milus Banister, MD;  Location: [facility];  Service:  Endoscopy;  Laterality: N/A;  . ECTOPIC PREGNANCY SURGERY    . POLYPECTOMY  12/17/2018   Procedure: POLYPECTOMY;  Surgeon: Milus Banister, MD;  Location: [facility];  Service: Endoscopy;;    Family History  Problem Relation Age of Onset  . Arthritis Mother   . Hypertension Mother   . Heart disease Mother   . Anxiety disorder Mother   . Thyroid disease Mother   . Obesity Mother   . Aneurysm Father   . Sudden death Father   . Hyperlipidemia Other   . Hypertension Other   . Diabetes Maternal Grandfather   . Coronary artery disease Other     Social History   Tobacco Use  . Smoking status: Former Research scientist (life sciences)  . Smokeless tobacco: Never Used  Substance Use Topics  . Alcohol use: Yes    Comment: socially, not even on weekly basis    Subjective:   I connected with [name] on 02/27/21 at  3:20 PM EDT by a video enabled telemedicine application and verified that I am speaking with the correct person using two identifiers.   I discussed the limitations of evaluation and management by telemedicine and the availability of in person appointments. The patient expressed understanding and agreed to proceed. Provider in office/ patient is at home; provider and patient are only 2 people on video call.   Patient is concerned about exposure to tainted peanut butter; notes she ate JIF peanut butter on Thursday evening only to find out later it was part of the national recall; notes that by Friday am she was having some diarrhea and "just felt bad." Notes she is doing better today and has not had further diarrhea; concerned about having salmonella due to underlying health conditions however and interested in testing;     Objective:  Vitals:   02/27/21 1512  BP: 128/82  Temp: 98 F (36.7 C)  TempSrc: Tympanic  Weight: 280 lb (127 kg)  Height: '5\' 5"'  (1.651 m)    General: Well developed, well nourished, in no acute distress  Skin : Warm and dry.  Head: Normocephalic and atraumatic   Lungs: Respirations unlabored;  Neurologic: Alert and oriented; speech intact; face symmetrical;  Assessment:  1. Acute diarrhea     Plan:  Clinically improving but agree that testing is appropriate; stool studies ordered and will follow up once results obtained; BRAT diet/ fluids as tolerated;   No follow-ups on file.  Orders Placed This Encounter  Procedures  . GI Profile, Stool, PCR    Standing Status:   Future    Standing Expiration Date:   02/27/2022    Requested Prescriptions    No prescriptions requested or ordered in this encounter

## 2021-02-28 DIAGNOSIS — E039 Hypothyroidism, unspecified: Secondary | ICD-10-CM | POA: Diagnosis not present

## 2021-03-01 ENCOUNTER — Other Ambulatory Visit: Payer: Self-pay

## 2021-03-01 ENCOUNTER — Other Ambulatory Visit (INDEPENDENT_AMBULATORY_CARE_PROVIDER_SITE_OTHER): Payer: 59

## 2021-03-01 DIAGNOSIS — R197 Diarrhea, unspecified: Secondary | ICD-10-CM | POA: Diagnosis not present

## 2021-03-05 ENCOUNTER — Other Ambulatory Visit (INDEPENDENT_AMBULATORY_CARE_PROVIDER_SITE_OTHER): Payer: Self-pay | Admitting: Family Medicine

## 2021-03-05 DIAGNOSIS — E119 Type 2 diabetes mellitus without complications: Secondary | ICD-10-CM

## 2021-03-05 LAB — GI PROFILE, STOOL, PCR

## 2021-03-19 ENCOUNTER — Other Ambulatory Visit: Payer: Self-pay | Admitting: Family Medicine

## 2021-03-19 DIAGNOSIS — R601 Generalized edema: Secondary | ICD-10-CM

## 2021-03-19 DIAGNOSIS — I10 Essential (primary) hypertension: Secondary | ICD-10-CM

## 2021-05-29 ENCOUNTER — Ambulatory Visit: Payer: 59 | Attending: Internal Medicine

## 2021-05-29 ENCOUNTER — Other Ambulatory Visit (HOSPITAL_BASED_OUTPATIENT_CLINIC_OR_DEPARTMENT_OTHER): Payer: Self-pay

## 2021-05-29 ENCOUNTER — Other Ambulatory Visit: Payer: Self-pay

## 2021-05-29 DIAGNOSIS — Z23 Encounter for immunization: Secondary | ICD-10-CM

## 2021-05-29 MED ORDER — PFIZER-BIONT COVID-19 VAC-TRIS 30 MCG/0.3ML IM SUSP
INTRAMUSCULAR | 0 refills | Status: DC
  Filled 2021-05-29: qty 0.3, 1d supply, fill #0

## 2021-05-29 NOTE — Progress Notes (Signed)
   Covid-19 Vaccination Clinic  Name:  Tracey Page    MRN: [id number] DOB: 12-13-1955  05/29/2021  Tracey Page was observed post Covid-19 immunization for 15 minutes without incident. She was provided with Vaccine Information Sheet and instruction to access the V-Safe system.   Tracey Page was instructed to call 911 with any severe reactions post vaccine: Difficulty breathing  Swelling of face and throat  A fast heartbeat  A bad rash all over body  Dizziness and weakness   Immunizations Administered     Name Date Dose VIS Date Route   PFIZER Comrnaty(Gray TOP) Covid-19 Vaccine 05/29/2021  1:58 PM 0.3 mL 09/14/2020 Intramuscular   Manufacturer: [place]   Lot: [id number]   NDC: [phone]

## 2021-06-23 ENCOUNTER — Other Ambulatory Visit: Payer: Self-pay | Admitting: Family Medicine

## 2021-06-23 DIAGNOSIS — I10 Essential (primary) hypertension: Secondary | ICD-10-CM

## 2021-06-26 DIAGNOSIS — E039 Hypothyroidism, unspecified: Secondary | ICD-10-CM | POA: Diagnosis not present

## 2021-06-26 DIAGNOSIS — E1165 Type 2 diabetes mellitus with hyperglycemia: Secondary | ICD-10-CM | POA: Diagnosis not present

## 2021-06-26 DIAGNOSIS — E7800 Pure hypercholesterolemia, unspecified: Secondary | ICD-10-CM | POA: Diagnosis not present

## 2021-06-26 LAB — BASIC METABOLIC PANEL
BUN: 14 (ref 4–21)
CO2: 28 — AB (ref 13–22)
Chloride: 105 (ref 99–108)
Creatinine: 0.8 (ref 0.5–1.1)
Glucose: 102
Potassium: 4.2 (ref 3.4–5.3)
Sodium: 143 (ref 137–147)

## 2021-06-26 LAB — HEPATIC FUNCTION PANEL
ALT: 21 (ref 7–35)
AST: 14 (ref 13–35)
Alkaline Phosphatase: 93 (ref 25–125)
Bilirubin, Total: 0.5

## 2021-06-26 LAB — LIPID PANEL
Cholesterol: 163 (ref 0–200)
HDL: 43 (ref 35–70)
LDL Cholesterol: 105
LDl/HDL Ratio: 2.4
Triglycerides: 75 (ref 40–160)

## 2021-06-26 LAB — COMPREHENSIVE METABOLIC PANEL
Albumin: 4.1 (ref 3.5–5.0)
Calcium: 9.2 (ref 8.7–10.7)
GFR calc Af Amer: 95
GFR calc non Af Amer: 82

## 2021-06-26 LAB — TSH: TSH: 1.12 (ref 0.41–5.90)

## 2021-06-26 LAB — HEMOGLOBIN A1C: Hemoglobin A1C: 6.1

## 2021-07-03 DIAGNOSIS — E1165 Type 2 diabetes mellitus with hyperglycemia: Secondary | ICD-10-CM | POA: Diagnosis not present

## 2021-07-03 DIAGNOSIS — I10 Essential (primary) hypertension: Secondary | ICD-10-CM | POA: Diagnosis not present

## 2021-07-03 DIAGNOSIS — E7800 Pure hypercholesterolemia, unspecified: Secondary | ICD-10-CM | POA: Diagnosis not present

## 2021-07-03 DIAGNOSIS — E039 Hypothyroidism, unspecified: Secondary | ICD-10-CM | POA: Diagnosis not present

## 2021-07-04 ENCOUNTER — Encounter: Payer: Self-pay | Admitting: *Deleted

## 2021-08-24 ENCOUNTER — Other Ambulatory Visit: Payer: Self-pay

## 2021-08-24 ENCOUNTER — Encounter: Payer: Self-pay | Admitting: Family Medicine

## 2021-08-24 ENCOUNTER — Ambulatory Visit: Payer: 59 | Admitting: Family Medicine

## 2021-08-24 VITALS — BP 140/80 | HR 82 | Temp 97.8°F | Resp 18 | Ht 65.0 in | Wt 294.2 lb

## 2021-08-24 DIAGNOSIS — K219 Gastro-esophageal reflux disease without esophagitis: Secondary | ICD-10-CM

## 2021-08-24 DIAGNOSIS — K648 Other hemorrhoids: Secondary | ICD-10-CM

## 2021-08-24 DIAGNOSIS — Z23 Encounter for immunization: Secondary | ICD-10-CM | POA: Diagnosis not present

## 2021-08-24 DIAGNOSIS — K921 Melena: Secondary | ICD-10-CM | POA: Diagnosis not present

## 2021-08-24 LAB — CBC WITH DIFFERENTIAL/PLATELET
Absolute Monocytes: 358 cells/uL (ref 200–950)
Basophils Absolute: 39 cells/uL (ref 0–200)
Basophils Relative: 0.8 %
Eosinophils Absolute: 108 cells/uL (ref 15–500)
Eosinophils Relative: 2.2 %
HCT: 44 % (ref 35.0–45.0)
Hemoglobin: 14.7 g/dL (ref 11.7–15.5)
Lymphs Abs: 1877 cells/uL (ref 850–3900)
MCH: 30.5 pg (ref 27.0–33.0)
MCHC: 33.4 g/dL (ref 32.0–36.0)
MCV: 91.3 fL (ref 80.0–100.0)
MPV: 11.4 fL (ref 7.5–12.5)
Monocytes Relative: 7.3 %
Neutro Abs: 2519 cells/uL (ref 1500–7800)
Neutrophils Relative %: 51.4 %
Platelets: 229 10*3/uL (ref 140–400)
RBC: 4.82 10*6/uL (ref 3.80–5.10)
RDW: 13.6 % (ref 11.0–15.0)
Total Lymphocyte: 38.3 %
WBC: 4.9 10*3/uL (ref 3.8–10.8)

## 2021-08-24 MED ORDER — HYDROCORTISONE ACE-PRAMOXINE 1-1 % EX CREA: 1.0000 "application " | TOPICAL_CREAM | Freq: Two times a day (BID) | CUTANEOUS | 0 refills | Status: DC

## 2021-08-24 MED ORDER — PANTOPRAZOLE SODIUM 20 MG PO TBEC: 20.0000 mg | DELAYED_RELEASE_TABLET | Freq: Every day | ORAL | 1 refills | Status: DC

## 2021-08-24 NOTE — Assessment & Plan Note (Signed)
Check labs and I fob Refill protonix-- pt has not been taking it  Consider gi referral

## 2021-08-24 NOTE — Assessment & Plan Note (Signed)
Rectal supp  Consider gi referral

## 2021-08-24 NOTE — Progress Notes (Addendum)
Subjective:   By signing my name below, I, Tracey Page, attest that this documentation has been prepared under the direction and in the presence of Tracey Held, DO. 08/24/2021    Patient ID: Tracey Page, female    DOB: 01/28/1956, 65 y.o.   MRN: [id number]  Chief Complaint  Patient presents with   Blood In Stools    Pt states having 3 days of blood with wiping. Pt states no blood in stools and she states no long having blood. Pt reports dark stools and no pain.     HPI Patient is in today for an office visit.  She reports that for the past three days, she has been seeing blood on the tissue paper after wiping herself. She recently ate grits and has a history of diverticulitis and internal hemorrhoids. She denies abdominal pain, heart burn, dysuria and indigestion. She has not been taking 20 mg Protonix.  She will receive the flu vaccine today.   Past Medical History:  Diagnosis Date   Anxiety    Bilateral swelling of feet and ankles    Diverticulitis    Food allergy    Hyperlipidemia    Hypertension    Hypothyroidism    Lymphedema    Obesity    Pre-diabetes    Thyroid disease     Past Surgical History:  Procedure Laterality Date   COLONOSCOPY WITH PROPOFOL N/A 12/17/2018   Procedure: COLONOSCOPY WITH PROPOFOL;  Surgeon: Milus Banister, MD;  Location: [facility];  Service: Endoscopy;  Laterality: N/A;   ECTOPIC PREGNANCY SURGERY     POLYPECTOMY  12/17/2018   Procedure: POLYPECTOMY;  Surgeon: Milus Banister, MD;  Location: [facility];  Service: Endoscopy;;    Family History  Problem Relation Age of Onset   Arthritis Mother    Hypertension Mother    Heart disease Mother    Anxiety disorder Mother    Thyroid disease Mother    Obesity Mother    Aneurysm Father    Sudden death Father    Hyperlipidemia Other    Hypertension Other    Diabetes Maternal Grandfather    Coronary artery disease Other     Social History   Socioeconomic History    Marital status: Single    Spouse name: Not on file   Number of children: Not on file   Years of education: Not on file   Highest education level: Not on file  Occupational History   Occupation: retired  Tobacco Use   Smoking status: Former   Smokeless tobacco: Never  Scientific laboratory technician Use: Never used  Substance and Sexual Activity   Alcohol use: Yes    Comment: socially, not even on weekly basis   Drug use: No   Sexual activity: Yes    Birth control/protection: Post-menopausal  Other Topics Concern   Not on file  Social History Narrative   Not on file   Social Determinants of Health   Financial Resource Strain: Not on file  Food Insecurity: Not on file  Transportation Needs: Not on file  Physical Activity: Not on file  Stress: Not on file  Social Connections: Not on file  Intimate Partner Violence: Not on file    Outpatient Medications Prior to Visit  Medication Sig Dispense Refill   ALPRAZolam (XANAX) 0.25 MG tablet TAKE ONE TABLET BY MOUTH TWICE DAILY AS NEEDED FOR ANXIETY 45 tablet 0   amLODipine (NORVASC) 10 MG tablet Take 1 tablet by mouth  once daily 90 tablet 1   atorvastatin (LIPITOR) 20 MG tablet Take 1 tablet (20 mg total) by mouth daily. 90 tablet 1   azelastine (ASTELIN) 0.1 % nasal spray Place 1 spray into both nostrils 2 (two) times daily. Use in each nostril as directed 30 mL 12   blood glucose meter kit and supplies KIT Dispense based on patient and insurance preference. Use up to four times daily as directed. (FOR ICD-9 250.00, 250.01). 1 each 0   Blood Glucose Monitoring Suppl (ONE TOUCH ULTRA 2) w/Device KIT 1 kit by Does not apply route daily. 1 kit 0   COVID-19 mRNA Vac-TriS, Pfizer, (PFIZER-BIONT COVID-19 VAC-TRIS) SUSP injection Inject into the muscle. 0.3 mL 0   EPINEPHrine 0.3 mg/0.3 mL IJ SOAJ injection INJECT CONTENTS OF 1 PEN AS NEEDED FOR ALLERGIC REACTION     furosemide (LASIX) 20 MG tablet TAKE 1 TO 2 TABLETS BY MOUTH ONCE DAILY AS NEEDED  FOR  EDEMA 180 tablet 0   glucose blood (ONETOUCH ULTRA) test strip Use as instructed 100 each 0   levocetirizine (XYZAL) 5 MG tablet Take 1 tablet (5 mg total) by mouth every evening. 30 tablet 5   liothyronine (CYTOMEL) 5 MCG tablet Take 5 mcg by mouth daily.     metoprolol succinate (TOPROL-XL) 50 MG 24 hr tablet TAKE 3 TABLETS BY MOUTH ONCE DAILY WITH OR IMMEDIATELY FOLLOWING A MEAL 270 tablet 1   NONFORMULARY OR COMPOUNDED ITEM Compression socks  30-40 mmhg  #1  As directed 1 each 0   OneTouch Delica Lancets 55H MISC 1 each by Does not apply route daily. 100 each 0   SYNTHROID 112 MCG tablet Take 1 tablet by mouth daily before breakfast.      Vitamin D, Ergocalciferol, (DRISDOL) 1.25 MG (50000 UNIT) CAPS capsule Take 1 capsule (50,000 Units total) by mouth every 7 (seven) days. 12 capsule 3   pantoprazole (PROTONIX) 20 MG tablet Take 1 tablet (20 mg total) by mouth daily. (Patient taking differently: Take 20 mg by mouth daily as needed.) 90 tablet 1   fluticasone (FLONASE) 50 MCG/ACT nasal spray Place 2 sprays into both nostrils daily. (Patient not taking: Reported on 02/27/2021) 16 g 6   Semaglutide 14 MG TABS Take 7 mg by mouth daily.  (Patient not taking: Reported on 02/27/2021)     No facility-administered medications prior to visit.    Allergies  Allergen Reactions   Diovan [Valsartan] Swelling and Other (See Comments)    angioedema   Iodine Swelling   Lisinopril Swelling   Shellfish Allergy Swelling    Swelling of tongue   Sulfonamide Derivatives Swelling    Swelling in hand/knees    Review of Systems  Constitutional:  Negative for fever.  HENT:  Negative for congestion, ear pain, hearing loss, sinus pain and sore throat.   Eyes:  Negative for blurred vision and pain.  Respiratory:  Negative for cough, sputum production, shortness of breath and wheezing.   Cardiovascular:  Negative for chest pain and palpitations.  Gastrointestinal:  Positive for blood in stool. Negative  for constipation, diarrhea, nausea and vomiting.  Genitourinary:  Negative for dysuria, frequency, hematuria and urgency.  Musculoskeletal:  Negative for back pain, falls and myalgias.  Neurological:  Negative for dizziness, sensory change, loss of consciousness, weakness and headaches.  Endo/Heme/Allergies:  Negative for environmental allergies. Does not bruise/bleed easily.  Psychiatric/Behavioral:  Negative for depression and suicidal ideas. The patient is not nervous/anxious and does not have insomnia.  Objective:    Physical Exam Constitutional:      General: She is not in acute distress.    Appearance: Normal appearance. She is not ill-appearing.  HENT:     Head: Normocephalic and atraumatic.     Right Ear: External ear normal.     Left Ear: External ear normal.  Eyes:     Extraocular Movements: Extraocular movements intact.     Pupils: Pupils are equal, round, and reactive to light.  Cardiovascular:     Rate and Rhythm: Normal rate and regular rhythm.     Pulses: Normal pulses.     Heart sounds: Normal heart sounds. No murmur heard.   No gallop.  Pulmonary:     Effort: Pulmonary effort is normal. No respiratory distress.     Breath sounds: Normal breath sounds. No wheezing, rhonchi or rales.  Abdominal:     General: Bowel sounds are normal. There is no distension.     Palpations: Abdomen is soft. There is no mass.     Tenderness: There is no abdominal tenderness. There is no guarding or rebound.     Hernia: No hernia is present.  Genitourinary:    Comments: Pt refused rectal exam Musculoskeletal:     Cervical back: Normal range of motion and neck supple.  Lymphadenopathy:     Cervical: No cervical adenopathy.  Skin:    General: Skin is warm and dry.  Neurological:     Mental Status: She is alert and oriented to person, place, and time.  Psychiatric:        Behavior: Behavior normal.    BP 140/80 (BP Location: Right Arm, Patient Position: Sitting, Cuff Size:  Large)   Pulse 82   Temp 97.8 F (36.6 C) (Oral)   Resp 18   Ht '5\' 5"'  (1.651 m)   Wt 294 lb 3.2 oz (133.4 kg)   SpO2 100%   BMI 48.96 kg/m  Wt Readings from Last 3 Encounters:  08/24/21 294 lb 3.2 oz (133.4 kg)  02/27/21 280 lb (127 kg)  08/28/20 293 lb 12.8 oz (133.3 kg)    Diabetic Foot Exam - Simple   No data filed    Lab Results  Component Value Date   WBC 4.8 08/11/2020   HGB 13.9 08/11/2020   HCT 41.5 08/11/2020   PLT 218 08/11/2020   GLUCOSE 87 08/11/2020   CHOL 163 06/26/2021   TRIG 75 06/26/2021   HDL 43 06/26/2021   LDLDIRECT 179.1 09/16/2012   LDLCALC 105 06/26/2021   ALT 21 06/26/2021   AST 14 06/26/2021   NA 143 06/26/2021   K 4.2 06/26/2021   CL 105 06/26/2021   CREATININE 0.8 06/26/2021   BUN 14 06/26/2021   CO2 28 (A) 06/26/2021   TSH 1.12 06/26/2021   HGBA1C 6.1 06/26/2021   MICROALBUR 8.2 (H) 07/14/2015    Lab Results  Component Value Date   TSH 1.12 06/26/2021   Lab Results  Component Value Date   WBC 4.8 08/11/2020   HGB 13.9 08/11/2020   HCT 41.5 08/11/2020   MCV 92.6 08/11/2020   PLT 218 08/11/2020   Lab Results  Component Value Date   NA 143 06/26/2021   K 4.2 06/26/2021   CO2 28 (A) 06/26/2021   GLUCOSE 87 08/11/2020   BUN 14 06/26/2021   CREATININE 0.8 06/26/2021   BILITOT 0.3 08/11/2020   ALKPHOS 93 06/26/2021   AST 14 06/26/2021   ALT 21 06/26/2021   PROT 6.7 08/11/2020  ALBUMIN 4.1 06/26/2021   CALCIUM 9.2 06/26/2021   ANIONGAP 8 04/11/2020   GFR 107.14 06/04/2018   Lab Results  Component Value Date   CHOL 163 06/26/2021   Lab Results  Component Value Date   HDL 43 06/26/2021   Lab Results  Component Value Date   LDLCALC 105 06/26/2021   Lab Results  Component Value Date   TRIG 75 06/26/2021   Lab Results  Component Value Date   CHOLHDL 3.3 08/11/2020   Lab Results  Component Value Date   HGBA1C 6.1 06/26/2021       Assessment & Plan:   Problem List Items Addressed This Visit        Unprioritized   Blood in stool - Primary    Check labs and I fob Refill protonix-- pt has not been taking it  Consider gi referral      Relevant Orders   Fecal occult blood, imunochemical([facility])   CBC with Differential/Platelet   Gastroesophageal reflux disease    Refill protonix       Relevant Medications   pantoprazole (PROTONIX) 20 MG tablet   Internal hemorrhoid, bleeding    Rectal supp  Consider gi referral      Relevant Medications   pramoxine-hydrocortisone (PROCTOCREAM-HC) 1-1 % rectal cream   Other Visit Diagnoses     Need for influenza vaccination       Relevant Orders   Flu Vaccine QUAD High Dose(Fluad) (Completed)       Meds ordered this encounter  Medications   pantoprazole (PROTONIX) 20 MG tablet    Sig: Take 1 tablet (20 mg total) by mouth daily.    Dispense:  90 tablet    Refill:  1   pramoxine-hydrocortisone (PROCTOCREAM-HC) 1-1 % rectal cream    Sig: Place 1 application rectally 2 (two) times daily.    Dispense:  30 g    Refill:  0    I,Tracey Page,acting as a scribe for [employer], DO.,have documented all relevant documentation on the behalf of Tracey Held, DO,as directed by  Tracey Held, DO while in the presence of Tracey Held, DO.   I, Tracey Held, DO., personally preformed the services described in this documentation.  All medical record entries made by the scribe were at my direction and in my presence.  I have reviewed the chart and discharge instructions (if applicable) and agree that the record reflects my personal performance and is accurate and complete. 08/24/2021

## 2021-08-24 NOTE — Assessment & Plan Note (Signed)
Refill protonix 

## 2021-08-24 NOTE — Patient Instructions (Signed)
Hemorrhoids °Hemorrhoids are swollen veins in and around the rectum or anus. There are two types of hemorrhoids: °Internal hemorrhoids. These occur in the veins that are just inside the rectum. They may poke through to the outside and become irritated and painful. °External hemorrhoids. These occur in the veins that are outside the anus and can be felt as a painful swelling or hard lump near the anus. °Most hemorrhoids do not cause serious problems, and they can be managed with home treatments such as diet and lifestyle changes. If home treatments do not help the symptoms, procedures can be done to shrink or remove the hemorrhoids. °What are the causes? °This condition is caused by increased pressure in the anal area. This pressure may result from various things, including: °Constipation. °Straining to have a bowel movement. °Diarrhea. °Pregnancy. °Obesity. °Sitting for long periods of time. °Heavy lifting or other activity that causes you to strain. °Anal sex. °Riding a bike for a long period of time. °What are the signs or symptoms? °Symptoms of this condition include: °Pain. °Anal itching or irritation. °Rectal bleeding. °Leakage of stool (feces). °Anal swelling. °One or more lumps around the anus. °How is this diagnosed? °This condition can often be diagnosed through a visual exam. Other exams or tests may also be done, such as: °An exam that involves feeling the rectal area with a gloved hand (digital rectal exam). °An exam of the anal canal that is done using a small tube (anoscope). °A blood test, if you have lost a significant amount of blood. °A test to look inside the colon using a flexible tube with a camera on the end (sigmoidoscopy or colonoscopy). °How is this treated? °This condition can usually be treated at home. However, various procedures may be done if dietary changes, lifestyle changes, and other home treatments do not help your symptoms. These procedures can help make the hemorrhoids smaller or  remove them completely. Some of these procedures involve surgery, and others do not. Common procedures include: °Rubber band ligation. Rubber bands are placed at the base of the hemorrhoids to cut off their blood supply. °Sclerotherapy. Medicine is injected into the hemorrhoids to shrink them. °Infrared coagulation. A type of light energy is used to get rid of the hemorrhoids. °Hemorrhoidectomy surgery. The hemorrhoids are surgically removed, and the veins that supply them are tied off. °Stapled hemorrhoidopexy surgery. The surgeon staples the base of the hemorrhoid to the rectal wall. °Follow these instructions at home: °Eating and drinking ° °Eat foods that have a lot of fiber in them, such as whole grains, beans, nuts, fruits, and vegetables. °Ask your health care provider about taking products that have added fiber (fiber supplements). °Reduce the amount of fat in your diet. You can do this by eating low-fat dairy products, eating less red meat, and avoiding processed foods. °Drink enough fluid to keep your urine pale yellow. °Managing pain and swelling ° °Take warm sitz baths for 20 minutes, 3-4 times a day to ease pain and discomfort. You may do this in a bathtub or using a portable sitz bath that fits over the toilet. °If directed, apply ice to the affected area. Using ice packs between sitz baths may be helpful. °Put ice in a plastic bag. °Place a towel between your skin and the bag. °Leave the ice on for 20 minutes, 2-3 times a day. °General instructions °Take over-the-counter and prescription medicines only as told by your health care provider. °Use medicated creams or suppositories as told. °Get regular exercise.   Ask your health care provider how much and what kind of exercise is best for you. In general, you should do moderate exercise for at least 30 minutes on most days of the week (150 minutes each week). This can include activities such as walking, biking, or yoga. °Go to the bathroom when you have  the urge to have a bowel movement. Do not wait. °Avoid straining to have bowel movements. °Keep the anal area dry and clean. Use wet toilet paper or moist towelettes after a bowel movement. °Do not sit on the toilet for long periods of time. This increases blood pooling and pain. °Keep all follow-up visits as told by your health care provider. This is important. °Contact a health care provider if you have: °Increasing pain and swelling that are not controlled by treatment or medicine. °Difficulty having a bowel movement, or you are unable to have a bowel movement. °Pain or inflammation outside the area of the hemorrhoids. °Get help right away if you have: °Uncontrolled bleeding from your rectum. °Summary °Hemorrhoids are swollen veins in and around the rectum or anus. °Most hemorrhoids can be managed with home treatments such as diet and lifestyle changes. °Taking warm sitz baths can help ease pain and discomfort. °In severe cases, procedures or surgery can be done to shrink or remove the hemorrhoids. °This information is not intended to replace advice given to you by your health care provider. Make sure you discuss any questions you have with your health care provider. °Document Revised: 04/04/2021 Document Reviewed: 04/04/2021 °Elsevier Patient Education © 2022 [employer] ° °

## 2021-08-27 DIAGNOSIS — Z130 Encounter for screening for diseases of the blood and blood-forming organs and certain disorders involving the immune mechanism: Secondary | ICD-10-CM | POA: Diagnosis not present

## 2021-08-27 DIAGNOSIS — E669 Obesity, unspecified: Secondary | ICD-10-CM | POA: Diagnosis not present

## 2021-08-27 DIAGNOSIS — Z1231 Encounter for screening mammogram for malignant neoplasm of breast: Secondary | ICD-10-CM | POA: Diagnosis not present

## 2021-08-27 DIAGNOSIS — Z1389 Encounter for screening for other disorder: Secondary | ICD-10-CM | POA: Diagnosis not present

## 2021-08-27 DIAGNOSIS — Z01419 Encounter for gynecological examination (general) (routine) without abnormal findings: Secondary | ICD-10-CM | POA: Diagnosis not present

## 2021-08-27 DIAGNOSIS — Z6841 Body Mass Index (BMI) 40.0 and over, adult: Secondary | ICD-10-CM | POA: Diagnosis not present

## 2021-09-04 ENCOUNTER — Other Ambulatory Visit (INDEPENDENT_AMBULATORY_CARE_PROVIDER_SITE_OTHER): Payer: 59

## 2021-09-04 DIAGNOSIS — K921 Melena: Secondary | ICD-10-CM

## 2021-09-04 LAB — FECAL OCCULT BLOOD, IMMUNOCHEMICAL: Fecal Occult Bld: NEGATIVE

## 2021-09-16 ENCOUNTER — Other Ambulatory Visit: Payer: Self-pay | Admitting: Family Medicine

## 2021-09-16 DIAGNOSIS — E559 Vitamin D deficiency, unspecified: Secondary | ICD-10-CM

## 2021-09-18 ENCOUNTER — Encounter: Payer: 59 | Admitting: Family Medicine

## 2021-09-20 ENCOUNTER — Ambulatory Visit (INDEPENDENT_AMBULATORY_CARE_PROVIDER_SITE_OTHER): Payer: 59 | Admitting: Family Medicine

## 2021-09-20 ENCOUNTER — Encounter: Payer: Self-pay | Admitting: Family Medicine

## 2021-09-20 VITALS — BP 140/82 | HR 65 | Temp 97.8°F | Resp 18 | Ht 65.0 in | Wt 295.6 lb

## 2021-09-20 DIAGNOSIS — Z23 Encounter for immunization: Secondary | ICD-10-CM | POA: Diagnosis not present

## 2021-09-20 DIAGNOSIS — E2839 Other primary ovarian failure: Secondary | ICD-10-CM

## 2021-09-20 DIAGNOSIS — K219 Gastro-esophageal reflux disease without esophagitis: Secondary | ICD-10-CM

## 2021-09-20 DIAGNOSIS — E785 Hyperlipidemia, unspecified: Secondary | ICD-10-CM | POA: Diagnosis not present

## 2021-09-20 DIAGNOSIS — Z0000 Encounter for general adult medical examination without abnormal findings: Secondary | ICD-10-CM

## 2021-09-20 DIAGNOSIS — I10 Essential (primary) hypertension: Secondary | ICD-10-CM

## 2021-09-20 DIAGNOSIS — K648 Other hemorrhoids: Secondary | ICD-10-CM

## 2021-09-20 DIAGNOSIS — I890 Lymphedema, not elsewhere classified: Secondary | ICD-10-CM

## 2021-09-20 DIAGNOSIS — E559 Vitamin D deficiency, unspecified: Secondary | ICD-10-CM | POA: Diagnosis not present

## 2021-09-20 DIAGNOSIS — E1165 Type 2 diabetes mellitus with hyperglycemia: Secondary | ICD-10-CM

## 2021-09-20 DIAGNOSIS — E039 Hypothyroidism, unspecified: Secondary | ICD-10-CM

## 2021-09-20 DIAGNOSIS — E119 Type 2 diabetes mellitus without complications: Secondary | ICD-10-CM

## 2021-09-20 LAB — MICROALBUMIN / CREATININE URINE RATIO
Creatinine,U: 48.7 mg/dL
Microalb Creat Ratio: 3 mg/g (ref 0.0–30.0)
Microalb, Ur: 1.5 mg/dL (ref 0.0–1.9)

## 2021-09-20 LAB — LIPID PANEL
Cholesterol: 148 mg/dL (ref 0–200)
HDL: 46.7 mg/dL (ref >39.00)
LDL Cholesterol: 88 mg/dL (ref 0–99)
NonHDL: 100.96
Total CHOL/HDL Ratio: 3
Triglycerides: 63 mg/dL (ref 0.0–149.0)
VLDL: 12.6 mg/dL (ref 0.0–40.0)

## 2021-09-20 LAB — COMPREHENSIVE METABOLIC PANEL
ALT: 27 U/L (ref 0–35)
AST: 18 U/L (ref 0–37)
Albumin: 4 g/dL (ref 3.5–5.2)
Alkaline Phosphatase: 85 U/L (ref 39–117)
BUN: 13 mg/dL (ref 6–23)
CO2: 32 mEq/L (ref 19–32)
Calcium: 9.2 mg/dL (ref 8.4–10.5)
Chloride: 101 mEq/L (ref 96–112)
Creatinine, Ser: 0.72 mg/dL (ref 0.40–1.20)
GFR: 87.59 mL/min (ref >60.00)
Glucose, Bld: 82 mg/dL (ref 70–99)
Potassium: 3.6 mEq/L (ref 3.5–5.1)
Sodium: 140 mEq/L (ref 135–145)
Total Bilirubin: 0.6 mg/dL (ref 0.2–1.2)
Total Protein: 7.2 g/dL (ref 6.0–8.3)

## 2021-09-20 LAB — HEMOGLOBIN A1C: Hgb A1c MFr Bld: 6.3 % (ref 4.6–6.5)

## 2021-09-20 LAB — CBC WITH DIFFERENTIAL/PLATELET
Basophils Absolute: 0.1 10*3/uL (ref 0.0–0.1)
Basophils Relative: 1.2 % (ref 0.0–3.0)
Eosinophils Absolute: 0.1 10*3/uL (ref 0.0–0.7)
Eosinophils Relative: 2 % (ref 0.0–5.0)
HCT: 41.8 % (ref 36.0–46.0)
Hemoglobin: 13.8 g/dL (ref 12.0–15.0)
Lymphocytes Relative: 33.3 % (ref 12.0–46.0)
Lymphs Abs: 1.7 10*3/uL (ref 0.7–4.0)
MCHC: 33.1 g/dL (ref 30.0–36.0)
MCV: 92.5 fl (ref 78.0–100.0)
Monocytes Absolute: 0.4 10*3/uL (ref 0.1–1.0)
Monocytes Relative: 8.2 % (ref 3.0–12.0)
Neutro Abs: 2.8 10*3/uL (ref 1.4–7.7)
Neutrophils Relative %: 55.3 % (ref 43.0–77.0)
Platelets: 192 10*3/uL (ref 150.0–400.0)
RBC: 4.52 Mil/uL (ref 3.87–5.11)
RDW: 14.9 % (ref 11.5–15.5)
WBC: 5 10*3/uL (ref 4.0–10.5)

## 2021-09-20 LAB — VITAMIN D 25 HYDROXY (VIT D DEFICIENCY, FRACTURES): VITD: 33.66 ng/mL (ref 30.00–100.00)

## 2021-09-20 LAB — TSH: TSH: 1.08 u[IU]/mL (ref 0.35–5.50)

## 2021-09-20 MED ORDER — PANTOPRAZOLE SODIUM 20 MG PO TBEC: 20.0000 mg | DELAYED_RELEASE_TABLET | Freq: Every day | ORAL | 1 refills | Status: DC

## 2021-09-20 MED ORDER — HYDROCORTISONE ACE-PRAMOXINE 1-1 % EX CREA: 1.0000 "application " | TOPICAL_CREAM | Freq: Two times a day (BID) | CUTANEOUS | 0 refills | Status: AC

## 2021-09-20 MED ORDER — NONFORMULARY OR COMPOUNDED ITEM: 0 refills | Status: AC

## 2021-09-20 NOTE — Progress Notes (Signed)
° °Subjective:  ° °By signing my name below, I, [name], attest that this documentation has been prepared under the direction and in the presence of Yvonne R Lowne Chase, DO. 09/20/2021 ° ° Patient ID: Tracey Page, female    DOB: 09/09/1956, 65 y.o.   MRN: [id number] ° °Chief Complaint  °Patient presents with  ° Annual Exam  °  Pt states not fasting   ° ° °HPI °Patient is in today for a comprehensive phsical exam. ° °She reports she never got the prescriptions from her last visit. ° °She does not check her blood sugar levels at home. ° °She would like a prescription for support hoses to manage the swelling in her feet and ankles.  ° °She denies fever, hearing loss, ear pain,congestion, sinus pain, sore throat, eye pain, chest pain, palpitations, cough, shortness of breath, wheezing, nausea. vomiting, diarrhea, constipation, blood in stool, dysuria,frequency, hematuria and headaches.  ° °She has an upcoming eye appointment in March 2023. ° °She has received the flu vaccine. She has 4 Pfizer Covid-19 vaccines. She will receive the pneumonia vaccine.  ° °Past Medical History:  °Diagnosis Date  ° Anxiety   ° Bilateral swelling of feet and ankles   ° Diverticulitis   ° Food allergy   ° Hyperlipidemia   ° Hypertension   ° Hypothyroidism   ° Lymphedema   ° Obesity   ° Pre-diabetes   ° Thyroid disease   ° ° °Past Surgical History:  °Procedure Laterality Date  ° COLONOSCOPY WITH PROPOFOL N/A 12/17/2018  ° Procedure: COLONOSCOPY WITH PROPOFOL;  Surgeon: Jacobs, Daniel P, MD;  Location: [facility];  Service: Endoscopy;  Laterality: N/A;  ° ECTOPIC PREGNANCY SURGERY    ° POLYPECTOMY  12/17/2018  ° Procedure: POLYPECTOMY;  Surgeon: Jacobs, Daniel P, MD;  Location: [facility];  Service: Endoscopy;;  ° ° °Family History  °Problem Relation Age of Onset  ° Arthritis Mother   ° Hypertension Mother   ° Heart disease Mother   ° Anxiety disorder Mother   ° Thyroid disease Mother   ° Obesity Mother   ° Aneurysm Father   ° Sudden  death Father   ° Hyperlipidemia Other   ° Hypertension Other   ° Diabetes Maternal Grandfather   ° Coronary artery disease Other   ° ° °Social History  ° °Socioeconomic History  ° Marital status: Single  °  Spouse name: Not on file  ° Number of children: Not on file  ° Years of education: Not on file  ° Highest education level: Not on file  °Occupational History  ° Occupation: retired  °Tobacco Use  ° Smoking status: Former  ° Smokeless tobacco: Never  °Vaping Use  ° Vaping Use: Never used  °Substance and Sexual Activity  ° Alcohol use: Yes  °  Comment: socially, not even on weekly basis  ° Drug use: No  ° Sexual activity: Yes  °  Birth control/protection: Post-menopausal  °Other Topics Concern  ° Not on file  °Social History Narrative  ° Not on file  ° °Social Determinants of Health  ° °Financial Resource Strain: Not on file  °Food Insecurity: Not on file  °Transportation Needs: Not on file  °Physical Activity: Not on file  °Stress: Not on file  °Social Connections: Not on file  °Intimate Partner Violence: Not on file  ° ° °Outpatient Medications Prior to Visit  °Medication Sig Dispense Refill  ° ALPRAZolam (XANAX) 0.25 MG tablet TAKE ONE TABLET BY MOUTH TWICE   DAILY AS NEEDED FOR ANXIETY 45 tablet 0  ° amLODipine (NORVASC) 10 MG tablet Take 1 tablet by mouth once daily 90 tablet 1  ° atorvastatin (LIPITOR) 20 MG tablet Take 1 tablet (20 mg total) by mouth daily. 90 tablet 1  ° azelastine (ASTELIN) 0.1 % nasal spray Place 1 spray into both nostrils 2 (two) times daily. Use in each nostril as directed 30 mL 12  ° blood glucose meter kit and supplies KIT Dispense based on patient and insurance preference. Use up to four times daily as directed. (FOR ICD-9 250.00, 250.01). 1 each 0  ° Blood Glucose Monitoring Suppl (ONE TOUCH ULTRA 2) w/Device KIT 1 kit by Does not apply route daily. 1 kit 0  ° EPINEPHrine 0.3 mg/0.3 mL IJ SOAJ injection INJECT CONTENTS OF 1 PEN AS NEEDED FOR ALLERGIC REACTION    ° furosemide (LASIX)  20 MG tablet TAKE 1 TO 2 TABLETS BY MOUTH ONCE DAILY AS NEEDED FOR  EDEMA 180 tablet 0  ° glucose blood (ONETOUCH ULTRA) test strip Use as instructed 100 each 0  ° levocetirizine (XYZAL) 5 MG tablet Take 1 tablet (5 mg total) by mouth every evening. 30 tablet 5  ° liothyronine (CYTOMEL) 5 MCG tablet Take 5 mcg by mouth daily.    ° metoprolol succinate (TOPROL-XL) 50 MG 24 hr tablet TAKE 3 TABLETS BY MOUTH ONCE DAILY WITH OR IMMEDIATELY FOLLOWING A MEAL 270 tablet 1  ° NONFORMULARY OR COMPOUNDED ITEM Compression socks  30-40 mmhg  #1  As directed 1 each 0  ° OneTouch Delica Lancets 33G MISC 1 each by Does not apply route daily. 100 each 0  ° SYNTHROID 112 MCG tablet Take 1 tablet by mouth daily before breakfast.     ° Vitamin D, Ergocalciferol, (DRISDOL) 1.25 MG (50000 UNIT) CAPS capsule Take 1 capsule by mouth once a week 12 capsule 0  ° pantoprazole (PROTONIX) 20 MG tablet Take 1 tablet (20 mg total) by mouth daily. 90 tablet 1  ° pramoxine-hydrocortisone (PROCTOCREAM-HC) 1-1 % rectal cream Place 1 application rectally 2 (two) times daily. 30 g 0  ° COVID-19 mRNA Vac-TriS, Pfizer, (PFIZER-BIONT COVID-19 VAC-TRIS) SUSP injection Inject into the muscle. (Patient not taking: Reported on 09/20/2021) 0.3 mL 0  ° °No facility-administered medications prior to visit.  ° ° °Allergies  °Allergen Reactions  ° Diovan [Valsartan] Swelling and Other (See Comments)  °  angioedema  ° Iodine Swelling  ° Lisinopril Swelling  ° Shellfish Allergy Swelling  °  Swelling of tongue  ° Sulfonamide Derivatives Swelling  °  Swelling in hand/knees  ° ° °Review of Systems  °Constitutional:  Negative for fever.  °HENT:  Negative for congestion, ear pain, hearing loss, sinus pain and sore throat.   °Eyes:  Negative for blurred vision and pain.  °Respiratory:  Negative for cough, sputum production, shortness of breath and wheezing.   °Cardiovascular:  Negative for chest pain and palpitations.  °Gastrointestinal:  Negative for blood in stool,  constipation, diarrhea, nausea and vomiting.  °Genitourinary:  Negative for dysuria, frequency, hematuria and urgency.  °Musculoskeletal:  Negative for back pain, falls and myalgias.  °Neurological:  Negative for dizziness, sensory change, loss of consciousness, weakness and headaches.  °Endo/Heme/Allergies:  Negative for environmental allergies. Does not bruise/bleed easily.  °Psychiatric/Behavioral:  Negative for depression and suicidal ideas. The patient is not nervous/anxious and does not have insomnia.   ° °   °Objective:  °  °Physical Exam °Constitutional:   °   General:   She is not in acute distress. °   Appearance: Normal appearance. She is not ill-appearing.  °HENT:  °   Head: Normocephalic and atraumatic.  °   Right Ear: Tympanic membrane, ear canal and external ear normal.  °   Left Ear: Tympanic membrane, ear canal and external ear normal.  °Eyes:  °   Extraocular Movements: Extraocular movements intact.  °   Pupils: Pupils are equal, round, and reactive to light.  °Cardiovascular:  °   Rate and Rhythm: Normal rate and regular rhythm.  °   Pulses: Normal pulses.  °   Heart sounds: Normal heart sounds. No murmur heard. °  No gallop.  °Pulmonary:  °   Effort: Pulmonary effort is normal. No respiratory distress.  °   Breath sounds: Normal breath sounds. No wheezing, rhonchi or rales.  °Abdominal:  °   General: Bowel sounds are normal. There is no distension.  °   Palpations: Abdomen is soft. There is no mass.  °   Tenderness: There is no abdominal tenderness. There is no guarding or rebound.  °   Hernia: No hernia is present.  °Musculoskeletal:  °   Cervical back: Normal range of motion and neck supple.  °Feet:  °   Comments: Diabetic Foot Exam - Simple   °No data filed °   °Lymphadenopathy:  °   Cervical: No cervical adenopathy.  °Skin: °   General: Skin is warm and dry.  °Neurological:  °   Mental Status: She is alert and oriented to person, place, and time.  °Psychiatric:     °   Behavior: Behavior  normal.  ° ° °BP 140/82 (BP Location: Left Arm, Patient Position: Sitting, Cuff Size: Large)    Pulse 65    Temp 97.8 °F (36.6 °C) (Oral)    Resp 18    Ht 5' 5" (1.651 m)    Wt 295 lb 9.6 oz (134.1 kg)    SpO2 100%    BMI 49.19 kg/m²  °Wt Readings from Last 3 Encounters:  °09/20/21 295 lb 9.6 oz (134.1 kg)  °08/24/21 294 lb 3.2 oz (133.4 kg)  °02/27/21 280 lb (127 kg)  ° ° °Diabetic Foot Exam - Simple   °Simple Foot Form °Diabetic Foot exam was performed with the following findings: Yes 09/20/2021 12:41 PM  °Visual Inspection °No deformities, no ulcerations, no other skin breakdown bilaterally: Yes °Sensation Testing °Intact to touch and monofilament testing bilaterally: Yes °Pulse Check °Posterior Tibialis and Dorsalis pulse intact bilaterally: Yes °Comments °  ° °Lab Results  °Component Value Date  ° WBC 4.9 08/24/2021  ° HGB 14.7 08/24/2021  ° HCT 44.0 08/24/2021  ° PLT 229 08/24/2021  ° GLUCOSE 87 08/11/2020  ° CHOL 163 06/26/2021  ° TRIG 75 06/26/2021  ° HDL 43 06/26/2021  ° LDLDIRECT 179.1 09/16/2012  ° LDLCALC 105 06/26/2021  ° ALT 21 06/26/2021  ° AST 14 06/26/2021  ° NA 143 06/26/2021  ° K 4.2 06/26/2021  ° CL 105 06/26/2021  ° CREATININE 0.8 06/26/2021  ° BUN 14 06/26/2021  ° CO2 28 (A) 06/26/2021  ° TSH 1.12 06/26/2021  ° HGBA1C 6.1 06/26/2021  ° MICROALBUR 8.2 (H) 07/14/2015  ° ° °Lab Results  °Component Value Date  ° TSH 1.12 06/26/2021  ° °Lab Results  °Component Value Date  ° WBC 4.9 08/24/2021  ° HGB 14.7 08/24/2021  ° HCT 44.0 08/24/2021  ° MCV 91.3 08/24/2021  ° PLT 229 08/24/2021  ° °Lab Results  °  Component Value Date  ° NA 143 06/26/2021  ° K 4.2 06/26/2021  ° CO2 28 (A) 06/26/2021  ° GLUCOSE 87 08/11/2020  ° BUN 14 06/26/2021  ° CREATININE 0.8 06/26/2021  ° BILITOT 0.3 08/11/2020  ° ALKPHOS 93 06/26/2021  ° AST 14 06/26/2021  ° ALT 21 06/26/2021  ° PROT 6.7 08/11/2020  ° ALBUMIN 4.1 06/26/2021  ° CALCIUM 9.2 06/26/2021  ° ANIONGAP 8 04/11/2020  ° GFR 107.14 06/04/2018  ° °Lab Results  °Component  Value Date  ° CHOL 163 06/26/2021  ° °Lab Results  °Component Value Date  ° HDL 43 06/26/2021  ° °Lab Results  °Component Value Date  ° LDLCALC 105 06/26/2021  ° °Lab Results  °Component Value Date  ° TRIG 75 06/26/2021  ° °Lab Results  °Component Value Date  ° CHOLHDL 3.3 08/11/2020  ° °Lab Results  °Component Value Date  ° HGBA1C 6.1 06/26/2021  ° ° °   ° °Colonoscopy: Last completed on 12/17/2018. Polyps found in the cecum and ascending colon that were resected and retrieved. Diverticulosis in the entire colon and internal hemorrhoids. Repeat in 10 years.  °Mammogram: Last checked on 08/27/2021. Results were normal.  Repeat in 1 year. °Pap Smear: Last checked on 07/31/2020. Results were normal. Repeat in 3 -5 years. ° °Assessment & Plan:  ° °Problem List Items Addressed This Visit   ° °  ° Unprioritized  ° MORBID OBESITY  ° Vitamin D deficiency  ° Relevant Orders  ° VITAMIN D 25 Hydroxy (Vit-D Deficiency, Fractures)  ° Gastroesophageal reflux disease  ° Relevant Medications  ° pantoprazole (PROTONIX) 20 MG tablet  ° Internal hemorrhoid, bleeding  ° Relevant Medications  ° pramoxine-hydrocortisone (PROCTOCREAM-HC) 1-1 % rectal cream  ° Hyperlipidemia LDL goal <100  °  Encourage heart healthy diet such as MIND or DASH diet, increase exercise, avoid trans fats, simple carbohydrates and processed foods, consider a krill or fish or flaxseed oil cap daily.  °  °  ° Hypothyroidism  °  Check labs  °con't synthroid  °  °  ° Relevant Orders  ° TSH  ° Preventative health care  ° Type 2 diabetes mellitus without complication, without long-term current use of insulin (HCC)  °  hgba1c to be checked , minimize simple carbs. Increase exercise as tolerated. Continue current meds ° °  °  ° °Other Visit Diagnoses   ° ° Type 2 diabetes mellitus with hyperglycemia, without long-term current use of insulin (HCC)    -  Primary  ° Relevant Orders  ° Comprehensive metabolic panel  ° Hemoglobin A1c  ° Microalbumin / creatinine urine ratio   ° Hyperlipidemia, unspecified hyperlipidemia type      ° Relevant Orders  ° CBC with Differential/Platelet  ° Comprehensive metabolic panel  ° Lipid panel  ° Primary hypertension      ° Relevant Orders  ° CBC with Differential/Platelet  ° Comprehensive metabolic panel  ° Lipid panel  ° TSH  ° Estrogen deficiency      ° Relevant Orders  ° DG Bone Density  ° Need for pneumococcal vaccination      ° Relevant Orders  ° Pneumococcal conjugate vaccine 20-valent (Prevnar 20) (Completed)  ° Lymphedema      ° Relevant Medications  ° NONFORMULARY OR COMPOUNDED ITEM  ° Other Relevant Orders  ° Ambulatory referral to Physical Therapy  ° °  ° ° °Meds ordered this encounter  °Medications  ° pramoxine-hydrocortisone (PROCTOCREAM-HC) 1-1 % rectal cream  °    Sig: Place 1 application rectally 2 (two) times daily.  °  Dispense:  30 g  °  Refill:  0  ° pantoprazole (PROTONIX) 20 MG tablet  °  Sig: Take 1 tablet (20 mg total) by mouth daily.  °  Dispense:  90 tablet  °  Refill:  1  ° NONFORMULARY OR COMPOUNDED ITEM  °  Sig: Compression socks   20-30 mm/ hg  #1  as directed for edema  °  Dispense:  1 each  °  Refill:  0  ° ° °I,[name],acting as a scribe for Yvonne R Lowne Chase, DO.,have documented all relevant documentation on the behalf of Yvonne R Lowne Chase, DO,as directed by  Yvonne R Lowne Chase, DO while in the presence of Yvonne R Lowne Chase, DO.  ° °I, Yvonne R Lowne Chase, DO. , personally preformed the services described in this documentation.  All medical record entries made by the scribe were at my direction and in my presence.  I have reviewed the chart and discharge instructions (if applicable) and agree that the record reflects my personal performance and is accurate and complete. 09/20/2021 °

## 2021-09-20 NOTE — Assessment & Plan Note (Signed)
Check labs con't synthroid 

## 2021-09-20 NOTE — Assessment & Plan Note (Signed)
Encourage heart healthy diet such as MIND or DASH diet, increase exercise, avoid trans fats, simple carbohydrates and processed foods, consider a krill or fish or flaxseed oil cap daily.  °

## 2021-09-20 NOTE — Patient Instructions (Signed)
Preventive Care 65 Years and Older, Female °Preventive care refers to lifestyle choices and visits with your health care provider that can promote health and wellness. Preventive care visits are also called wellness exams. °What can I expect for my preventive care visit? °Counseling °Your health care provider may ask you questions about your: °Medical history, including: °Past medical problems. °Family medical history. °Pregnancy and menstrual history. °History of falls. °Current health, including: °Memory and ability to understand (cognition). °Emotional well-being. °Home life and relationship well-being. °Sexual activity and sexual health. °Lifestyle, including: °Alcohol, nicotine or tobacco, and drug use. °Access to firearms. °Diet, exercise, and sleep habits. °Work and work environment. °Sunscreen use. °Safety issues such as seatbelt and bike helmet use. °Physical exam °Your health care provider will check your: °Height and weight. These may be used to calculate your BMI (body mass index). BMI is a measurement that tells if you are at a healthy weight. °Waist circumference. This measures the distance around your waistline. This measurement also tells if you are at a healthy weight and may help predict your risk of certain diseases, such as type 2 diabetes and high blood pressure. °Heart rate and blood pressure. °Body temperature. °Skin for abnormal spots. °What immunizations do I need? °Vaccines are usually given at various ages, according to a schedule. Your health care provider will recommend vaccines for you based on your age, medical history, and lifestyle or other factors, such as travel or where you work. °What tests do I need? °Screening °Your health care provider may recommend screening tests for certain conditions. This may include: °Lipid and cholesterol levels. °Hepatitis C test. °Hepatitis B test. °HIV (human immunodeficiency virus) test. °STI (sexually transmitted infection) testing, if you are at  risk. °Lung cancer screening. °Colorectal cancer screening. °Diabetes screening. This is done by checking your blood sugar (glucose) after you have not eaten for a while (fasting). °Mammogram. Talk with your health care provider about how often you should have regular mammograms. °BRCA-related cancer screening. This may be done if you have a family history of breast, ovarian, tubal, or peritoneal cancers. °Bone density scan. This is done to screen for osteoporosis. °Talk with your health care provider about your test results, treatment options, and if necessary, the need for more tests. °Follow these instructions at home: °Eating and drinking ° °Eat a diet that includes fresh fruits and vegetables, whole grains, lean protein, and low-fat dairy products. Limit your intake of foods with high amounts of sugar, saturated fats, and salt. °Take vitamin and mineral supplements as recommended by your health care provider. °Do not drink alcohol if your health care provider tells you not to drink. °If you drink alcohol: °Limit how much you have to 0-1 drink a day. °Know how much alcohol is in your drink. In the [place] one drink equals one 12 oz bottle of beer (355 mL), one 5 oz glass of wine (148 mL), or one 1½ oz glass of hard liquor (44 mL). °Lifestyle °Brush your teeth every morning and night with fluoride toothpaste. Floss one time each day. °Exercise for at least 30 minutes 5 or more days each week. °Do not use any products that contain nicotine or tobacco. These products include cigarettes, chewing tobacco, and vaping devices, such as e-cigarettes. If you need help quitting, ask your health care provider. °Do not use drugs. °If you are sexually active, practice safe sex. Use a condom or other form of protection in order to prevent STIs. °Take aspirin only as told by your   health care provider. Make sure that you understand how much to take and what form to take. Work with your health care provider to find out whether it  is safe and beneficial for you to take aspirin daily. Ask your health care provider if you need to take a cholesterol-lowering medicine (statin). Find healthy ways to manage stress, such as: Meditation, yoga, or listening to music. Journaling. Talking to a trusted person. Spending time with friends and family. Minimize exposure to UV radiation to reduce your risk of skin cancer. Safety Always wear your seat belt while driving or riding in a vehicle. Do not drive: If you have been drinking alcohol. Do not ride with someone who has been drinking. When you are tired or distracted. While texting. If you have been using any mind-altering substances or drugs. Wear a helmet and other protective equipment during sports activities. If you have firearms in your house, make sure you follow all gun safety procedures. What's next? Visit your health care provider once a year for an annual wellness visit. Ask your health care provider how often you should have your eyes and teeth checked. Stay up to date on all vaccines. This information is not intended to replace advice given to you by your health care provider. Make sure you discuss any questions you have with your health care provider. Document Revised: 03/21/2021 Document Reviewed: 03/21/2021 Elsevier Patient Education  [place].

## 2021-09-20 NOTE — Assessment & Plan Note (Signed)
hgba1c to be checked, minimize simple carbs. Increase exercise as tolerated. Continue current meds  

## 2021-09-24 ENCOUNTER — Telehealth (HOSPITAL_BASED_OUTPATIENT_CLINIC_OR_DEPARTMENT_OTHER): Payer: Self-pay

## 2021-09-25 ENCOUNTER — Telehealth (HOSPITAL_BASED_OUTPATIENT_CLINIC_OR_DEPARTMENT_OTHER): Payer: Self-pay

## 2021-09-26 ENCOUNTER — Telehealth: Payer: Self-pay

## 2021-09-26 NOTE — Telephone Encounter (Signed)
PA sent   Key: [id number]

## 2021-09-27 NOTE — Telephone Encounter (Signed)
[employer] December 21 Request Reference Number: [id number]. PANTOPRAZOLE TAB 20MG  is approved through 09/26/2022. Your patient may now fill this prescription and it will be covered.

## 2021-10-15 ENCOUNTER — Other Ambulatory Visit: Payer: Self-pay | Admitting: Family Medicine

## 2021-10-15 DIAGNOSIS — I890 Lymphedema, not elsewhere classified: Secondary | ICD-10-CM

## 2021-10-29 ENCOUNTER — Other Ambulatory Visit: Payer: Self-pay

## 2021-10-29 ENCOUNTER — Ambulatory Visit (HOSPITAL_BASED_OUTPATIENT_CLINIC_OR_DEPARTMENT_OTHER)
Admission: RE | Admit: 2021-10-29 | Discharge: 2021-10-29 | Disposition: A | Discharge status: Home / Self Care | Payer: 59 | Source: Ambulatory Visit | Attending: Family Medicine | Admitting: Family Medicine

## 2021-10-29 DIAGNOSIS — E2839 Other primary ovarian failure: Secondary | ICD-10-CM

## 2021-10-29 DIAGNOSIS — Z780 Asymptomatic menopausal state: Secondary | ICD-10-CM | POA: Diagnosis not present

## 2021-12-02 ENCOUNTER — Other Ambulatory Visit: Payer: Self-pay | Admitting: Family Medicine

## 2021-12-02 DIAGNOSIS — I10 Essential (primary) hypertension: Secondary | ICD-10-CM

## 2021-12-02 DIAGNOSIS — R601 Generalized edema: Secondary | ICD-10-CM

## 2021-12-06 ENCOUNTER — Encounter: Payer: Self-pay | Admitting: Gastroenterology

## 2021-12-13 ENCOUNTER — Other Ambulatory Visit: Payer: Self-pay | Admitting: Family Medicine

## 2021-12-13 DIAGNOSIS — I10 Essential (primary) hypertension: Secondary | ICD-10-CM

## 2021-12-22 ENCOUNTER — Other Ambulatory Visit: Payer: Self-pay | Admitting: Family Medicine

## 2021-12-22 DIAGNOSIS — I10 Essential (primary) hypertension: Secondary | ICD-10-CM

## 2021-12-24 DIAGNOSIS — H2513 Age-related nuclear cataract, bilateral: Secondary | ICD-10-CM | POA: Diagnosis not present

## 2021-12-24 DIAGNOSIS — Z7984 Long term (current) use of oral hypoglycemic drugs: Secondary | ICD-10-CM | POA: Diagnosis not present

## 2021-12-24 DIAGNOSIS — H5203 Hypermetropia, bilateral: Secondary | ICD-10-CM | POA: Diagnosis not present

## 2021-12-24 DIAGNOSIS — E119 Type 2 diabetes mellitus without complications: Secondary | ICD-10-CM | POA: Diagnosis not present

## 2021-12-24 DIAGNOSIS — H524 Presbyopia: Secondary | ICD-10-CM | POA: Diagnosis not present

## 2021-12-25 DIAGNOSIS — E1165 Type 2 diabetes mellitus with hyperglycemia: Secondary | ICD-10-CM | POA: Diagnosis not present

## 2021-12-25 DIAGNOSIS — E7800 Pure hypercholesterolemia, unspecified: Secondary | ICD-10-CM | POA: Diagnosis not present

## 2021-12-25 DIAGNOSIS — E039 Hypothyroidism, unspecified: Secondary | ICD-10-CM | POA: Diagnosis not present

## 2021-12-31 DIAGNOSIS — E039 Hypothyroidism, unspecified: Secondary | ICD-10-CM | POA: Diagnosis not present

## 2021-12-31 DIAGNOSIS — E1165 Type 2 diabetes mellitus with hyperglycemia: Secondary | ICD-10-CM | POA: Diagnosis not present

## 2021-12-31 DIAGNOSIS — E7800 Pure hypercholesterolemia, unspecified: Secondary | ICD-10-CM | POA: Diagnosis not present

## 2021-12-31 DIAGNOSIS — I10 Essential (primary) hypertension: Secondary | ICD-10-CM | POA: Diagnosis not present

## 2022-01-01 ENCOUNTER — Other Ambulatory Visit: Payer: Self-pay | Admitting: Endocrinology

## 2022-01-01 DIAGNOSIS — E7800 Pure hypercholesterolemia, unspecified: Secondary | ICD-10-CM

## 2022-02-11 ENCOUNTER — Ambulatory Visit
Admission: RE | Admit: 2022-02-11 | Discharge: 2022-02-11 | Disposition: A | Discharge status: Home / Self Care | Payer: No Typology Code available for payment source | Source: Ambulatory Visit | Attending: Endocrinology | Admitting: Endocrinology

## 2022-02-11 ENCOUNTER — Other Ambulatory Visit: Payer: Self-pay | Admitting: Family Medicine

## 2022-02-11 DIAGNOSIS — E7800 Pure hypercholesterolemia, unspecified: Secondary | ICD-10-CM | POA: Diagnosis not present

## 2022-02-11 DIAGNOSIS — I10 Essential (primary) hypertension: Secondary | ICD-10-CM

## 2022-02-12 ENCOUNTER — Encounter: Payer: Self-pay | Admitting: Family Medicine

## 2022-02-12 ENCOUNTER — Telehealth: Payer: Self-pay

## 2022-02-12 ENCOUNTER — Ambulatory Visit: Payer: 59 | Admitting: Family Medicine

## 2022-02-12 VITALS — BP 128/80 | HR 68 | Temp 98.0°F | Resp 16 | Ht 65.0 in | Wt 286.8 lb

## 2022-02-12 DIAGNOSIS — K644 Residual hemorrhoidal skin tags: Secondary | ICD-10-CM | POA: Diagnosis not present

## 2022-02-12 MED ORDER — HYDROCORTISONE ACETATE 25 MG RE SUPP
25.0000 mg | Freq: Two times a day (BID) | RECTAL | 0 refills | Status: AC
Start: 2022-02-12 — End: ?

## 2022-02-12 MED ORDER — HYDROCORTISONE (PERIANAL) 2.5 % EX CREA: 1.0000 "application " | TOPICAL_CREAM | Freq: Two times a day (BID) | CUTANEOUS | 0 refills | Status: AC

## 2022-02-12 NOTE — Telephone Encounter (Signed)
Key: [id number] ? ?Sent to plan ?

## 2022-02-12 NOTE — Progress Notes (Addendum)
? ?Subjective:  ? ?By signing my name below, I, [name], attest that this documentation has been prepared under the direction and in the presence of Ann Held, DO. 02/12/2022 ? ? ? Patient ID: Tracey Page, female    DOB: 1956/10/02, 66 y.o.   MRN: [id number] ? ?Chief Complaint  ?Patient presents with  ? Hemorrhoids  ?  Here for hemorrhoids  ? ? ?HPI ?Patient is in today for a office visit.  ? ?She complains of hemorrhoids for the past 3 weeks. Marland Kitchen  ?She continues taking 2.5 mg/ml mounjaro at this time. She reports having constipation while taking it. She reports talking to her pharmacist and was told her constipation caused her to strain and she developed hemorrhoids. Her hemorrhoids are bleeding. Her hemorrhoids have reduced in size since she first developed them. She has a history of internal hemorrhoids. She is taking preparation H to manage her symptoms.  ?She does not check her blood sugars at home while on mounjaro. She reports no new issues while taking mounjaro. She currently weighs 286 lb's and has a BMI of 47.73 kg/m^2 during this visit.  ?Lab Results  ?Component Value Date  ? HGBA1C 6.3 09/20/2021  ? ?She is UTD on shingrix vaccines at this time.  ? ? ?Past Medical History:  ?Diagnosis Date  ? Anxiety   ? Bilateral swelling of feet and ankles   ? Diverticulitis   ? Food allergy   ? Hyperlipidemia   ? Hypertension   ? Hypothyroidism   ? Lymphedema   ? Obesity   ? Pre-diabetes   ? Thyroid disease   ? ? ?Past Surgical History:  ?Procedure Laterality Date  ? COLONOSCOPY WITH PROPOFOL N/A 12/17/2018  ? Procedure: COLONOSCOPY WITH PROPOFOL;  Surgeon: Milus Banister, MD;  Location: [facility];  Service: Endoscopy;  Laterality: N/A;  ? ECTOPIC PREGNANCY SURGERY    ? POLYPECTOMY  12/17/2018  ? Procedure: POLYPECTOMY;  Surgeon: Milus Banister, MD;  Location: [facility];  Service: Endoscopy;;  ? ? ?Family History  ?Problem Relation Age of Onset  ? Arthritis Mother   ? Hypertension Mother   ?  Heart disease Mother   ? Anxiety disorder Mother   ? Thyroid disease Mother   ? Obesity Mother   ? Aneurysm Father   ? Sudden death Father   ? Hyperlipidemia Other   ? Hypertension Other   ? Diabetes Maternal Grandfather   ? Coronary artery disease Other   ? ? ?Social History  ? ?Socioeconomic History  ? Marital status: Single  ?  Spouse name: Not on file  ? Number of children: Not on file  ? Years of education: Not on file  ? Highest education level: Not on file  ?Occupational History  ? Occupation: retired  ?Tobacco Use  ? Smoking status: Former  ? Smokeless tobacco: Never  ?Vaping Use  ? Vaping Use: Never used  ?Substance and Sexual Activity  ? Alcohol use: Yes  ?  Comment: socially, not even on weekly basis  ? Drug use: No  ? Sexual activity: Yes  ?  Birth control/protection: Post-menopausal  ?Other Topics Concern  ? Not on file  ?Social History Narrative  ? Not on file  ? ?Social Determinants of Health  ? ?Financial Resource Strain: Not on file  ?Food Insecurity: Not on file  ?Transportation Needs: Not on file  ?Physical Activity: Not on file  ?Stress: Not on file  ?Social Connections: Not on file  ?Intimate  Partner Violence: Not on file  ? ? ?Outpatient Medications Prior to Visit  ?Medication Sig Dispense Refill  ? ALPRAZolam (XANAX) 0.25 MG tablet TAKE ONE TABLET BY MOUTH TWICE DAILY AS NEEDED FOR ANXIETY 45 tablet 0  ? amLODipine (NORVASC) 10 MG tablet Take 1 tablet by mouth once daily 90 tablet 0  ? atorvastatin (LIPITOR) 20 MG tablet Take 1 tablet (20 mg total) by mouth daily. 90 tablet 1  ? azelastine (ASTELIN) 0.1 % nasal spray Place 1 spray into both nostrils 2 (two) times daily. Use in each nostril as directed 30 mL 12  ? blood glucose meter kit and supplies KIT Dispense based on patient and insurance preference. Use up to four times daily as directed. (FOR ICD-9 250.00, 250.01). 1 each 0  ? Blood Glucose Monitoring Suppl (ONE TOUCH ULTRA 2) w/Device KIT 1 kit by Does not apply route daily. 1 kit 0  ?  EPINEPHrine 0.3 mg/0.3 mL IJ SOAJ injection INJECT CONTENTS OF 1 PEN AS NEEDED FOR ALLERGIC REACTION    ? furosemide (LASIX) 20 MG tablet TAKE 1 TO 2 TABLETS BY MOUTH ONCE DAILY AS NEEDED FOR EDEMA 180 tablet 0  ? glucose blood (ONETOUCH ULTRA) test strip Use as instructed 100 each 0  ? levocetirizine (XYZAL) 5 MG tablet Take 1 tablet (5 mg total) by mouth every evening. 30 tablet 5  ? liothyronine (CYTOMEL) 5 MCG tablet Take 5 mcg by mouth daily.    ? metoprolol succinate (TOPROL-XL) 50 MG 24 hr tablet TAKE 3 TABLETS BY MOUTH ONCE DAILY WITH A MEAL OR  IMMEDIATELY  FOLLOWING  A  MEAL 270 tablet 1  ? NONFORMULARY OR COMPOUNDED ITEM Compression socks  30-40 mmhg  #1  As directed 1 each 0  ? NONFORMULARY OR COMPOUNDED ITEM Compression socks   20-30 mm/ hg  #1  as directed for edema 1 each 0  ? OneTouch Delica Lancets 17O MISC 1 each by Does not apply route daily. 100 each 0  ? pantoprazole (PROTONIX) 20 MG tablet Take 1 tablet (20 mg total) by mouth daily. 90 tablet 1  ? pramoxine-hydrocortisone (PROCTOCREAM-HC) 1-1 % rectal cream Place 1 application rectally 2 (two) times daily. 30 g 0  ? SYNTHROID 112 MCG tablet Take 1 tablet by mouth daily before breakfast.     ? Vitamin D, Ergocalciferol, (DRISDOL) 1.25 MG (50000 UNIT) CAPS capsule Take 1 capsule by mouth once a week 12 capsule 0  ? ?No facility-administered medications prior to visit.  ? ? ?Allergies  ?Allergen Reactions  ? Diovan [Valsartan] Swelling and Other (See Comments)  ?  angioedema  ? Iodine Swelling  ? Lisinopril Swelling  ? Shellfish Allergy Swelling  ?  Swelling of tongue  ? Sulfonamide Derivatives Swelling  ?  Swelling in hand/knees  ? ? ?Review of Systems  ?Constitutional:  Negative for fever and malaise/fatigue.  ?HENT:  Negative for congestion.   ?Eyes:  Negative for blurred vision.  ?Respiratory:  Negative for shortness of breath.   ?Cardiovascular:  Negative for chest pain, palpitations and leg swelling.  ?Gastrointestinal:  Positive for  constipation. Negative for abdominal pain, blood in stool and nausea.  ?Genitourinary:  Negative for dysuria and frequency.  ?     (+)hemorrhoids  ?Musculoskeletal:  Negative for falls.  ?Skin:  Negative for rash.  ?Neurological:  Negative for dizziness, loss of consciousness and headaches.  ?Endo/Heme/Allergies:  Negative for environmental allergies.  ?Psychiatric/Behavioral:  Negative for depression. The patient is not nervous/anxious.   ? ?   ?  Objective:  ?  ?Physical Exam ?Vitals and nursing note reviewed.  ?Constitutional:   ?   General: She is not in acute distress. ?   Appearance: Normal appearance. She is not ill-appearing.  ?HENT:  ?   Head: Normocephalic and atraumatic.  ?   Right Ear: External ear normal.  ?   Left Ear: External ear normal.  ?Eyes:  ?   Extraocular Movements: Extraocular movements intact.  ?   Pupils: Pupils are equal, round, and reactive to light.  ?Cardiovascular:  ?   Rate and Rhythm: Normal rate and regular rhythm.  ?   Heart sounds: Normal heart sounds. No murmur heard. ?  No gallop.  ?Pulmonary:  ?   Effort: Pulmonary effort is normal. No respiratory distress.  ?   Breath sounds: Normal breath sounds. No wheezing or rales.  ?Genitourinary: ?   Rectum: Guaiac result negative. Tenderness and external hemorrhoid (1 grade 1 hemorrhoid noted during exam) present. No mass or anal fissure.  ?Skin: ?   General: Skin is warm and dry.  ?Neurological:  ?   Mental Status: She is alert and oriented to person, place, and time.  ?Psychiatric:     ?   Judgment: Judgment normal.  ? ? ?BP 128/80 (BP Location: Right Arm, Patient Position: Sitting, Cuff Size: Normal)   Pulse 68   Temp 98 ?F (36.7 ?C) (Oral)   Resp 16   Ht _0  (1.651 m)   Wt 286 lb 12.8 oz (130.1 kg)   SpO2 98%   BMI 47.73 kg/m?  ?Wt Readings from Last 3 Encounters:  ?02/12/22 286 lb 12.8 oz (130.1 kg)  ?09/20/21 295 lb 9.6 oz (134.1 kg)  ?08/24/21 294 lb 3.2 oz (133.4 kg)  ? ? ?Diabetic Foot Exam - Simple   ?No data filed ?   ? ?Lab Results  ?Component Value Date  ? WBC 5.0 09/20/2021  ? HGB 13.8 09/20/2021  ? HCT 41.8 09/20/2021  ? PLT 192.0 09/20/2021  ? GLUCOSE 82 09/20/2021  ? CHOL 148 09/20/2021  ? TRIG 63.0 09/20/2021  ? HDL 46.70

## 2022-02-12 NOTE — Patient Instructions (Signed)

## 2022-02-20 ENCOUNTER — Telehealth: Payer: Self-pay | Admitting: Family Medicine

## 2022-02-20 NOTE — Telephone Encounter (Signed)
Pt called stating that she was wondering if she should get her mounjaro refilled for the 2.5 injection (currently on) or the 5.0 injection that was discussed in her last appt.  ?

## 2022-02-21 MED ORDER — TIRZEPATIDE 5 MG/0.5ML ~~LOC~~ SOAJ: 5.0000 mg | SUBCUTANEOUS | 0 refills | Status: DC

## 2022-02-21 NOTE — Telephone Encounter (Signed)
Rx sent. Pt made aware.  

## 2022-03-15 ENCOUNTER — Encounter: Payer: Self-pay | Admitting: Gastroenterology

## 2022-03-15 ENCOUNTER — Other Ambulatory Visit: Payer: Self-pay | Admitting: Family Medicine

## 2022-03-15 DIAGNOSIS — E559 Vitamin D deficiency, unspecified: Secondary | ICD-10-CM

## 2022-04-29 ENCOUNTER — Ambulatory Visit (AMBULATORY_SURGERY_CENTER): Payer: Self-pay | Admitting: *Deleted

## 2022-04-29 VITALS — Ht 65.0 in | Wt 279.2 lb

## 2022-04-29 DIAGNOSIS — Z860100 Personal history of colon polyps, unspecified: Secondary | ICD-10-CM

## 2022-04-29 MED ORDER — NA SULFATE-K SULFATE-MG SULF 17.5-3.13-1.6 GM/177ML PO SOLN: 1.0000 | ORAL | 0 refills | Status: DC

## 2022-04-29 NOTE — Progress Notes (Addendum)
Patient is here in-person for PV. Patient denies any allergies or soy. Patient is allergic to eggs. She is able to have flu vaccine.  Patient denies any problems with anesthesia/sedation. Patient is not on any oxygen at home. Patient is not taking any diet/weight loss medications or blood thinners. Went over procedure prep instructions with the patient. Patient is aware of our care-partner policy. Patient notified to use Good-Rx for prescription.

## 2022-05-14 ENCOUNTER — Telehealth: Payer: Self-pay | Admitting: *Deleted

## 2022-05-14 ENCOUNTER — Other Ambulatory Visit: Payer: Self-pay | Admitting: Family Medicine

## 2022-05-14 NOTE — Telephone Encounter (Signed)
Spoke with pt- rescheduled colonoscopy with Dr. Carlean Purl 05-27-22 at 10:00 am.  New instructions sent via [facility]

## 2022-05-15 ENCOUNTER — Encounter (INDEPENDENT_AMBULATORY_CARE_PROVIDER_SITE_OTHER): Payer: Self-pay

## 2022-05-15 NOTE — Telephone Encounter (Signed)
Would you like Pt to increase to Mounjaro 7.5mg?  

## 2022-05-16 MED ORDER — TIRZEPATIDE 7.5 MG/0.5ML ~~LOC~~ SOAJ: 7.5000 mg | SUBCUTANEOUS | 0 refills | Status: DC

## 2022-05-20 ENCOUNTER — Encounter: Payer: Self-pay | Admitting: Internal Medicine

## 2022-05-27 ENCOUNTER — Encounter: Payer: 59 | Admitting: Gastroenterology

## 2022-05-27 ENCOUNTER — Encounter: Payer: Self-pay | Admitting: Internal Medicine

## 2022-05-27 ENCOUNTER — Ambulatory Visit (AMBULATORY_SURGERY_CENTER): Payer: 59 | Admitting: Internal Medicine

## 2022-05-27 VITALS — BP 103/70 | HR 68 | Temp 97.3°F | Resp 12 | Ht 65.0 in | Wt 279.2 lb

## 2022-05-27 DIAGNOSIS — D125 Benign neoplasm of sigmoid colon: Secondary | ICD-10-CM | POA: Diagnosis not present

## 2022-05-27 DIAGNOSIS — Z86010 Personal history of colonic polyps: Secondary | ICD-10-CM

## 2022-05-27 DIAGNOSIS — Z1211 Encounter for screening for malignant neoplasm of colon: Secondary | ICD-10-CM | POA: Diagnosis not present

## 2022-05-27 DIAGNOSIS — D120 Benign neoplasm of cecum: Secondary | ICD-10-CM | POA: Diagnosis not present

## 2022-05-27 DIAGNOSIS — K635 Polyp of colon: Secondary | ICD-10-CM | POA: Diagnosis not present

## 2022-05-27 DIAGNOSIS — Z09 Encounter for follow-up examination after completed treatment for conditions other than malignant neoplasm: Secondary | ICD-10-CM | POA: Diagnosis not present

## 2022-05-27 MED ORDER — SODIUM CHLORIDE 0.9 % IV SOLN: 500.0000 mL | Freq: Once | INTRAVENOUS | Status: DC

## 2022-05-27 NOTE — Op Note (Signed)
[place] Patient Name: Tracey Page Procedure Date: 05/27/2022 10:48 AM MRN: [id number] Endoscopist: Gatha Mayer , MD Age: 66 Referring MD:  Date of Birth: 1956/04/20 Gender: Female Account #: [id number] Procedure:                Colonoscopy Indications:              Surveillance: Personal history of adenomatous                            polyps on last colonoscopy 3 years ago, Last                            colonoscopy: 2020 Medicines:                Monitored Anesthesia Care Procedure:                Pre-Anesthesia Assessment:                           - Prior to the procedure, a History and Physical                            was performed, and patient medications and                            allergies were reviewed. The patient's tolerance of                            previous anesthesia was also reviewed. The risks                            and benefits of the procedure and the sedation                            options and risks were discussed with the patient.                            All questions were answered, and informed consent                            was obtained. Prior Anticoagulants: The patient has                            taken no previous anticoagulant or antiplatelet                            agents. ASA Grade Assessment: III - A patient with                            severe systemic disease. After reviewing the risks                            and benefits, the patient was deemed in  satisfactory condition to undergo the procedure.                           After obtaining informed consent, the colonoscope                            was passed under direct vision. Throughout the                            procedure, the patient's blood pressure, pulse, and                            oxygen saturations were monitored continuously. The                            CF [id number] #[id number] was introduced through the  anus                            and advanced to the the cecum, identified by                            appendiceal orifice and ileocecal valve. The                            colonoscopy was performed without difficulty. The                            patient tolerated the procedure well. The quality                            of the bowel preparation was excellent. The bowel                            preparation used was SUPREP via split dose                            instruction. The ileocecal valve, appendiceal                            orifice, and rectum were photographed. Scope In: 10:51:36 AM Scope Out: 11:04:59 AM Scope Withdrawal Time: 0 hours 11 minutes 37 seconds  Total Procedure Duration: 0 hours 13 minutes 23 seconds  Findings:                 The perianal and digital rectal examinations were                            normal.                           Two sessile polyps were found in the sigmoid colon                            and cecum. The polyps were 3 to 8 mm in size. These  polyps were removed with a cold snare. Resection                            and retrieval were complete. Verification of                            patient identification for the specimen was done.                            Estimated blood loss was minimal.                           Multiple small and large-mouthed diverticula were                            found in the entire colon.                           The exam was otherwise without abnormality on                            direct and retroflexion views. Complications:            No immediate complications. Estimated Blood Loss:     Estimated blood loss was minimal. Impression:               - Two 3 to 8 mm polyps in the sigmoid colon and in                            the cecum, removed with a cold snare. Resected and                            retrieved.                           - Diverticulosis in the  entire examined colon.                           - The examination was otherwise normal on direct                            and retroflexion views.                           - Personal history of colonic polyps. adenomas x 2                            max 15 mm (Dr. Ardis Hughs) 2020 Recommendation:           - Patient has a contact number available for                            emergencies. The signs and symptoms of potential  delayed complications were discussed with the                            patient. Return to normal activities tomorrow.                            Written discharge instructions were provided to the                            patient.                           - Resume previous diet.                           - Continue present medications.                           - Await pathology results.                           - Repeat colonoscopy is recommended for                            surveillance. The colonoscopy date will be                            determined after pathology results from today's                            exam become available for review. Gatha Mayer, MD 05/27/2022 11:10:43 AM This report has been signed electronically.

## 2022-05-27 NOTE — Progress Notes (Signed)
[facility] History and Physical   Primary Care Physician:  Carollee Herter, Alferd Apa, DO   Reason for Procedure:   Hx colon adenomas  Plan:    colonoscopy     HPI: Tracey Page is a 66 y.o. female here for above  202 15 mm and 6 mm polyps - adenomas  Past Medical History:  Diagnosis Date   Anxiety    Bilateral swelling of feet and ankles    Diabetes mellitus without complication ([place])    Diverticulitis    Food allergy    Hyperlipidemia    Hypertension    Hypothyroidism    Lymphedema    Obesity    Pre-diabetes    Thyroid disease     Past Surgical History:  Procedure Laterality Date   COLONOSCOPY WITH PROPOFOL N/A 12/17/2018   Procedure: COLONOSCOPY WITH PROPOFOL;  Surgeon: Milus Banister, MD;  Location: [facility];  Service: Endoscopy;  Laterality: N/A;   ECTOPIC PREGNANCY SURGERY     POLYPECTOMY  12/17/2018   Procedure: POLYPECTOMY;  Surgeon: Milus Banister, MD;  Location: [facility];  Service: Endoscopy;;    Prior to Admission medications   Medication Sig Start Date End Date Taking? Authorizing Provider  amLODipine (NORVASC) 10 MG tablet Take 1 tablet by mouth once daily 02/12/22  Yes Lowne Chase, Kendrick Fries R, DO  blood glucose meter kit and supplies KIT Dispense based on patient and insurance preference. Use up to four times daily as directed. (FOR ICD-9 250.00, 250.01). 02/23/20  Yes Beasley, Caren D, MD  Blood Glucose Monitoring Suppl (ONE TOUCH ULTRA 2) w/Device KIT 1 kit by Does not apply route daily. 02/24/20  Yes Beasley, Caren D, MD  furosemide (LASIX) 20 MG tablet TAKE 1 TO 2 TABLETS BY MOUTH ONCE DAILY AS NEEDED FOR EDEMA 12/03/21  Yes Ann Held, DO  levothyroxine (SYNTHROID) 125 MCG tablet Take 125 mcg by mouth every morning. 02/12/22  Yes [provider]  metoprolol succinate (TOPROL-XL) 50 MG 24 hr tablet TAKE 3 TABLETS BY MOUTH ONCE DAILY WITH A MEAL OR  IMMEDIATELY  FOLLOWING  A  MEAL 12/14/21  Yes Lowne Chase, Yvonne R, DO   OneTouch Delica Lancets 32K MISC 1 each by Does not apply route daily. 02/24/20  Yes Beasley, Caren D, MD  rosuvastatin (CRESTOR) 40 MG tablet Take 40 mg by mouth daily. 04/09/22  Yes [provider]  Vitamin D, Ergocalciferol, (DRISDOL) 1.25 MG (50000 UNIT) CAPS capsule Take 1 capsule by mouth once a week 03/18/22  Yes Lowne Lyndal Pulley R, DO  azelastine (ASTELIN) 0.1 % nasal spray Place 1 spray into both nostrils 2 (two) times daily. Use in each nostril as directed Patient not taking: Reported on 04/29/2022 08/11/20   Carollee Herter, Kendrick Fries R, DO  EPINEPHrine 0.3 mg/0.3 mL IJ SOAJ injection INJECT CONTENTS OF 1 PEN AS NEEDED FOR ALLERGIC REACTION 01/01/19   [provider]  glucose blood (ONETOUCH ULTRA) test strip Use as instructed 02/24/20   Dennard Nip D, MD  hydrocortisone (ANUSOL-HC) 2.5 % rectal cream Place 1 application. rectally 2 (two) times daily. 02/12/22   Ann Held, DO  hydrocortisone (ANUSOL-HC) 25 MG suppository Place 1 suppository (25 mg total) rectally 2 (two) times daily. 02/12/22   Roma Schanz R, DO  NONFORMULARY OR COMPOUNDED ITEM Compression socks  30-40 mmhg  #1  As directed 03/05/19   Carollee Herter, Alferd Apa, DO  NONFORMULARY OR COMPOUNDED ITEM Compression socks   20-30 mm/  hg  #1  as directed for edema 09/20/21   Carollee Herter, Alferd Apa, DO  pramoxine-hydrocortisone (PROCTOCREAM-HC) 1-1 % rectal cream Place 1 application rectally 2 (two) times daily. 09/20/21   Ann Held, DO  tirzepatide [facility]) 7.5 MG/0.5ML Pen Inject 7.5 mg into the skin once a week. 05/16/22   Ann Held, DO    Current Outpatient Medications  Medication Sig Dispense Refill   amLODipine (NORVASC) 10 MG tablet Take 1 tablet by mouth once daily 90 tablet 0   blood glucose meter kit and supplies KIT Dispense based on patient and insurance preference. Use up to four times daily as directed. (FOR ICD-9 250.00, 250.01). 1 each 0   Blood Glucose Monitoring  Suppl (ONE TOUCH ULTRA 2) w/Device KIT 1 kit by Does not apply route daily. 1 kit 0   furosemide (LASIX) 20 MG tablet TAKE 1 TO 2 TABLETS BY MOUTH ONCE DAILY AS NEEDED FOR EDEMA 180 tablet 0   levothyroxine (SYNTHROID) 125 MCG tablet Take 125 mcg by mouth every morning.     metoprolol succinate (TOPROL-XL) 50 MG 24 hr tablet TAKE 3 TABLETS BY MOUTH ONCE DAILY WITH A MEAL OR  IMMEDIATELY  FOLLOWING  A  MEAL 270 tablet 1   OneTouch Delica Lancets 93Z MISC 1 each by Does not apply route daily. 100 each 0   rosuvastatin (CRESTOR) 40 MG tablet Take 40 mg by mouth daily.     Vitamin D, Ergocalciferol, (DRISDOL) 1.25 MG (50000 UNIT) CAPS capsule Take 1 capsule by mouth once a week 12 capsule 0   azelastine (ASTELIN) 0.1 % nasal spray Place 1 spray into both nostrils 2 (two) times daily. Use in each nostril as directed (Patient not taking: Reported on 04/29/2022) 30 mL 12   EPINEPHrine 0.3 mg/0.3 mL IJ SOAJ injection INJECT CONTENTS OF 1 PEN AS NEEDED FOR ALLERGIC REACTION     glucose blood (ONETOUCH ULTRA) test strip Use as instructed 100 each 0   hydrocortisone (ANUSOL-HC) 2.5 % rectal cream Place 1 application. rectally 2 (two) times daily. 30 g 0   hydrocortisone (ANUSOL-HC) 25 MG suppository Place 1 suppository (25 mg total) rectally 2 (two) times daily. 12 suppository 0   NONFORMULARY OR COMPOUNDED ITEM Compression socks  30-40 mmhg  #1  As directed 1 each 0   NONFORMULARY OR COMPOUNDED ITEM Compression socks   20-30 mm/ hg  #1  as directed for edema 1 each 0   pramoxine-hydrocortisone (PROCTOCREAM-HC) 1-1 % rectal cream Place 1 application rectally 2 (two) times daily. 30 g 0   tirzepatide (MOUNJARO) 7.5 MG/0.5ML Pen Inject 7.5 mg into the skin once a week. 6 mL 0   Current Facility-Administered Medications  Medication Dose Route Frequency Provider Last Rate Last Admin   0.9 %  sodium chloride infusion  500 mL Intravenous Once Gatha Mayer, MD        Allergies as of 05/27/2022 - Review  Complete 05/27/2022  Allergen Reaction Noted   Diovan [valsartan] Swelling and Other (See Comments) 09/09/2016   Eggs or egg-derived products Swelling 04/29/2022   Iodine Swelling 01/06/2012   Lisinopril Swelling 12/15/2018   Misc. sulfonamide containing compounds  05/27/2022   Shellfish allergy Swelling 01/06/2012   Sulfonamide derivatives Swelling 04/26/2008    Family History  Problem Relation Age of Onset   Arthritis Mother    Hypertension Mother    Heart disease Mother    Anxiety disorder Mother    Thyroid disease Mother  Obesity Mother    Aneurysm Father    Sudden death Father    Diabetes Maternal Grandfather    Hyperlipidemia Other    Hypertension Other    Coronary artery disease Other    Colon cancer Neg Hx    Colon polyps Neg Hx    Esophageal cancer Neg Hx    Stomach cancer Neg Hx    Rectal cancer Neg Hx     Social History   Socioeconomic History   Marital status: Single    Spouse name: Not on file   Number of children: Not on file   Years of education: Not on file   Highest education level: Not on file  Occupational History   Occupation: retired  Tobacco Use   Smoking status: Some Days    Types: Cigarettes   Smokeless tobacco: Never  Vaping Use   Vaping Use: Never used  Substance and Sexual Activity   Alcohol use: Yes    Comment: socially, occ wine   Drug use: No   Sexual activity: Yes    Birth control/protection: Post-menopausal  Other Topics Concern   Not on file  Social History Narrative   Not on file   Social Determinants of Health   Financial Resource Strain: Not on file  Food Insecurity: Not on file  Transportation Needs: Not on file  Physical Activity: Not on file  Stress: Not on file  Social Connections: Not on file  Intimate Partner Violence: Not on file    Review of Systems:  All other review of systems negative except as mentioned in the HPI.  Physical Exam: Vital signs BP (!) 143/82   Pulse 71   Temp (!) 97.3 F  (36.3 C)   Ht 5' 5" (1.651 m)   Wt 279 lb 3.2 oz (126.6 kg)   SpO2 100%   BMI 46.46 kg/m   General:   Alert,  Well-developed, well-nourished, pleasant and cooperative in NAD Lungs:  Clear throughout to auscultation.   Heart:  Regular rate and rhythm; no murmurs, clicks, rubs,  or gallops. Abdomen:  Soft, nontender and nondistended. Normal bowel sounds.   Neuro/Psych:  Alert and cooperative. Normal mood and affect. A and O x 3   _0  E. Carlean Purl, MD, [facility] [phone] (pager) 05/27/2022 10:41 AM@

## 2022-05-27 NOTE — Patient Instructions (Addendum)
I found and removed 2 small polyps today. I will let you know pathology results and when to have another routine colonoscopy by mail and/or My Chart.  You also have a condition called diverticulosis - common and not usually a problem. Please read the handout provided.  I appreciate the opportunity to care for you. Gatha Mayer, MD, [facility]  Handouts Provided:  Diverticulosis and Polyps  YOU HAD AN ENDOSCOPIC PROCEDURE TODAY AT [place]:   Refer to the procedure report that was given to you for any specific questions about what was found during the examination.  If the procedure report does not answer your questions, please call your gastroenterologist to clarify.  If you requested that your care partner not be given the details of your procedure findings, then the procedure report has been included in a sealed envelope for you to review at your convenience later.  YOU SHOULD EXPECT: Some feelings of bloating in the abdomen. Passage of more gas than usual.  Walking can help get rid of the air that was put into your GI tract during the procedure and reduce the bloating. If you had a lower endoscopy (such as a colonoscopy or flexible sigmoidoscopy) you may notice spotting of blood in your stool or on the toilet paper. If you underwent a bowel prep for your procedure, you may not have a normal bowel movement for a few days.  Please Note:  You might notice some irritation and congestion in your nose or some drainage.  This is from the oxygen used during your procedure.  There is no need for concern and it should clear up in a day or so.  SYMPTOMS TO REPORT IMMEDIATELY:  Following lower endoscopy (colonoscopy or flexible sigmoidoscopy):  Excessive amounts of blood in the stool  Significant tenderness or worsening of abdominal pains  Swelling of the abdomen that is new, acute  Fever of 100F or higher  For urgent or emergent issues, a gastroenterologist can be reached at any hour  by calling [phone]. Do not use MyChart messaging for urgent concerns.    DIET:  We do recommend a small meal at first, but then you may proceed to your regular diet.  Drink plenty of fluids but you should avoid alcoholic beverages for 24 hours.  ACTIVITY:  You should plan to take it easy for the rest of today and you should NOT DRIVE or use heavy machinery until tomorrow (because of the sedation medicines used during the test).    FOLLOW UP: Our staff will call the number listed on your records the next business day following your procedure.  We will call around 7:15- 8:00 am to check on you and address any questions or concerns that you may have regarding the information given to you following your procedure. If we do not reach you, we will leave a message.  If you develop any symptoms (ie: fever, flu-like symptoms, shortness of breath, cough etc.) before then, please call [phone].  If you test positive for Covid 19 in the 2 weeks post procedure, please call and report this information to [place].    If any biopsies were taken you will be contacted by phone or by letter within the next 1-3 weeks.  Please call us at [phone] if you have not heard about the biopsies in 3 weeks.    SIGNATURES/CONFIDENTIALITY: You and/or your care partner have signed paperwork which will be entered into your electronic medical record.  These signatures attest  to the fact that that the information above on your After Visit Summary has been reviewed and is understood.  Full responsibility of the confidentiality of this discharge information lies with you and/or your care-partner.

## 2022-05-27 NOTE — Progress Notes (Signed)
Called to room to assist during endoscopic procedure.  Patient ID and intended procedure confirmed with present staff. Received instructions for my participation in the procedure from the performing physician.  

## 2022-05-27 NOTE — Progress Notes (Signed)
Pt's states no medical or surgical changes since previsit or office visit. 

## 2022-05-27 NOTE — Progress Notes (Signed)
Report to pacu rn. Vss. Care resumed by rn. 

## 2022-05-28 ENCOUNTER — Telehealth: Payer: Self-pay | Admitting: *Deleted

## 2022-05-28 NOTE — Telephone Encounter (Signed)
Follow up attempt, no answer, unable to leave message.

## 2022-06-03 ENCOUNTER — Encounter: Payer: Self-pay | Admitting: Internal Medicine

## 2022-06-03 DIAGNOSIS — Z86010 Personal history of colonic polyps: Secondary | ICD-10-CM | POA: Insufficient documentation

## 2022-06-17 ENCOUNTER — Other Ambulatory Visit: Payer: Self-pay | Admitting: Family Medicine

## 2022-06-17 DIAGNOSIS — I10 Essential (primary) hypertension: Secondary | ICD-10-CM

## 2022-07-03 DIAGNOSIS — E1165 Type 2 diabetes mellitus with hyperglycemia: Secondary | ICD-10-CM | POA: Diagnosis not present

## 2022-07-03 DIAGNOSIS — E039 Hypothyroidism, unspecified: Secondary | ICD-10-CM | POA: Diagnosis not present

## 2022-07-03 DIAGNOSIS — E7800 Pure hypercholesterolemia, unspecified: Secondary | ICD-10-CM | POA: Diagnosis not present

## 2022-07-10 DIAGNOSIS — E7800 Pure hypercholesterolemia, unspecified: Secondary | ICD-10-CM | POA: Diagnosis not present

## 2022-07-10 DIAGNOSIS — E039 Hypothyroidism, unspecified: Secondary | ICD-10-CM | POA: Diagnosis not present

## 2022-07-10 DIAGNOSIS — I10 Essential (primary) hypertension: Secondary | ICD-10-CM | POA: Diagnosis not present

## 2022-07-10 DIAGNOSIS — E1165 Type 2 diabetes mellitus with hyperglycemia: Secondary | ICD-10-CM | POA: Diagnosis not present

## 2022-07-22 ENCOUNTER — Other Ambulatory Visit: Payer: Self-pay | Admitting: Family Medicine

## 2022-07-22 DIAGNOSIS — E559 Vitamin D deficiency, unspecified: Secondary | ICD-10-CM

## 2022-07-22 DIAGNOSIS — I10 Essential (primary) hypertension: Secondary | ICD-10-CM

## 2022-07-24 ENCOUNTER — Ambulatory Visit (INDEPENDENT_AMBULATORY_CARE_PROVIDER_SITE_OTHER): Payer: 59

## 2022-07-24 DIAGNOSIS — Z23 Encounter for immunization: Secondary | ICD-10-CM | POA: Diagnosis not present

## 2022-07-31 ENCOUNTER — Other Ambulatory Visit (HOSPITAL_BASED_OUTPATIENT_CLINIC_OR_DEPARTMENT_OTHER): Payer: Self-pay

## 2022-07-31 MED ORDER — COMIRNATY 30 MCG/0.3ML IM SUSY
PREFILLED_SYRINGE | INTRAMUSCULAR | 0 refills | Status: DC
  Filled 2022-07-31: qty 0.3, 1d supply, fill #0

## 2022-08-20 ENCOUNTER — Other Ambulatory Visit: Payer: Self-pay | Admitting: Family Medicine

## 2022-08-24 ENCOUNTER — Other Ambulatory Visit: Payer: Self-pay | Admitting: Family Medicine

## 2022-08-24 DIAGNOSIS — I10 Essential (primary) hypertension: Secondary | ICD-10-CM

## 2022-09-15 ENCOUNTER — Other Ambulatory Visit: Payer: Self-pay | Admitting: Family Medicine

## 2022-09-15 DIAGNOSIS — I10 Essential (primary) hypertension: Secondary | ICD-10-CM

## 2022-09-15 DIAGNOSIS — R601 Generalized edema: Secondary | ICD-10-CM

## 2022-09-23 ENCOUNTER — Other Ambulatory Visit: Payer: Self-pay | Admitting: Family Medicine

## 2022-09-23 DIAGNOSIS — I10 Essential (primary) hypertension: Secondary | ICD-10-CM

## 2022-09-24 ENCOUNTER — Telehealth: Payer: Self-pay | Admitting: Family Medicine

## 2022-09-24 NOTE — Telephone Encounter (Signed)
Pt called stating that she would like to have a 90 day supply of the following medication:  Prescription Request  09/24/2022  Is this a "Controlled Substance" medicine? No  LOV: Visit date not found  What is the name of the medication or equipment?   MOUNJARO 7.5 MG/0.5ML Pen [[id number]]   Have you contacted your pharmacy to request a refill? Yes   Which pharmacy would you like this sent to?  [facility], [address]. [place]. [place] [address] Phone: [phone] Fax: [phone]    Patient notified that their request is being sent to the clinical staff for review and that they should receive a response within 2 business days.   Please advise at Mobile [phone] (mobile)

## 2022-09-25 ENCOUNTER — Telehealth: Payer: Self-pay | Admitting: *Deleted

## 2022-09-25 MED ORDER — MOUNJARO 7.5 MG/0.5ML ~~LOC~~ SOAJ
7.5000 mg | SUBCUTANEOUS | 1 refills | Status: DC
  Filled 2023-08-06: qty 2, 28d supply, fill #0
  Filled 2023-08-31: qty 2, 28d supply, fill #1

## 2022-09-25 NOTE — Telephone Encounter (Signed)
Refill sent.

## 2022-09-25 NOTE — Telephone Encounter (Signed)
Prior auth started via cover my meds.  Awaiting determination.  Key: [id number]

## 2022-10-01 NOTE — Telephone Encounter (Signed)
Prior auth approved Request Reference Number: [id number] Approved through 03/27/23

## 2022-10-21 ENCOUNTER — Other Ambulatory Visit: Payer: Self-pay | Admitting: Family Medicine

## 2022-10-21 DIAGNOSIS — I10 Essential (primary) hypertension: Secondary | ICD-10-CM

## 2022-10-21 DIAGNOSIS — Z23 Encounter for immunization: Secondary | ICD-10-CM | POA: Diagnosis not present

## 2022-11-16 IMAGING — CT CT CARDIAC CORONARY ARTERY CALCIUM SCORE
2 of 6 series · 4 of 20 positions shown, 5 images · non-contrast
Comparison: None Available.

CLINICAL DATA: Hypercholesterolemia

EXAM:
CT CARDIAC CORONARY ARTERY CALCIUM SCORE
TECHNIQUE: Non-contrast imaging through the heart was performed using
prospective ECG gating. Image post processing was performed on an
independent workstation, allowing for quantitative analysis of the
heart and coronary arteries. Note that this exam targets the heart
and the chest was not imaged in its entirety.

[Series 2: calcium scoring 2.00 qr36 bestdiast 70% hrt calciu · axial · 0.43mm/px · z∈[+1844,+1876]mm · 2 of 49 slices shown, 3 images]
[im 17/49  vessel]
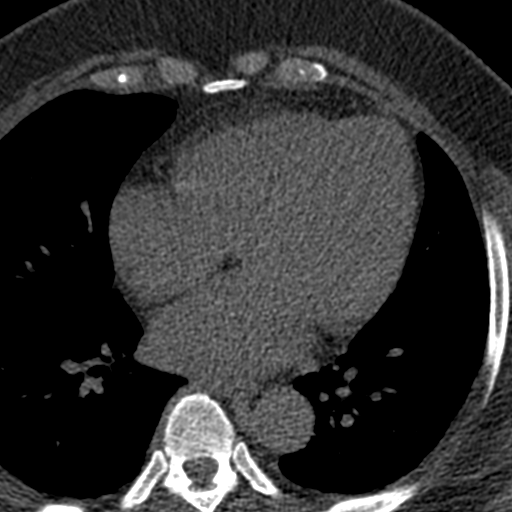
[im 17/49  lung]
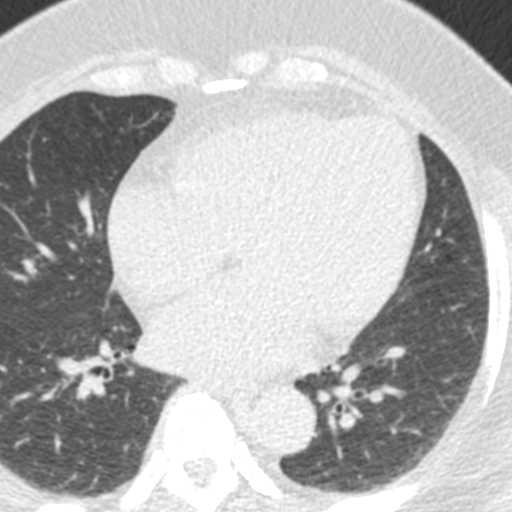
[im 33/49  vessel]
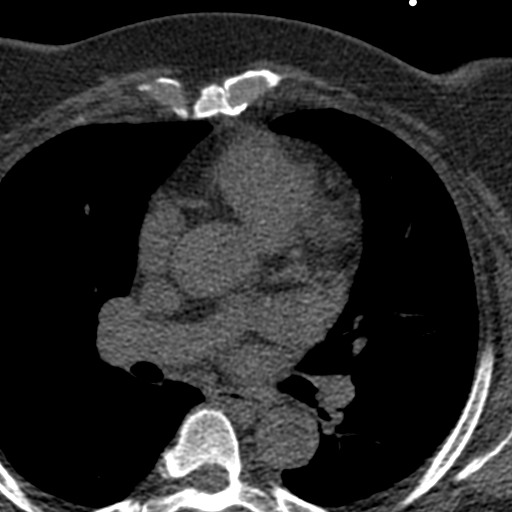

[Series 3: calcium scoring 2.00 br40 bestdiast 70% axial · axial · 0.71mm/px · z∈[+1844,+1876]mm · 2 of 49 slices shown]
[im 17/49  vessel]
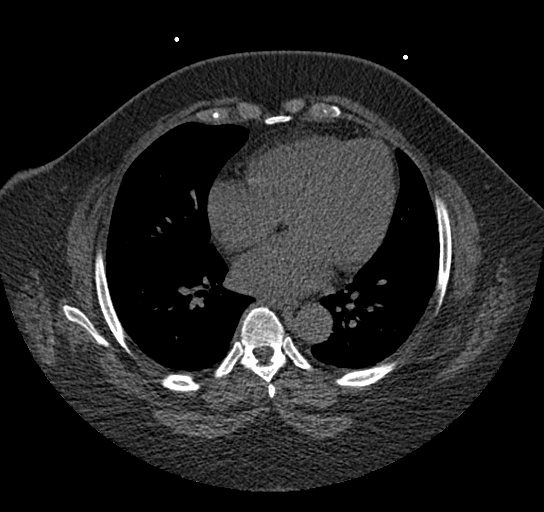
[im 33/49  vessel]
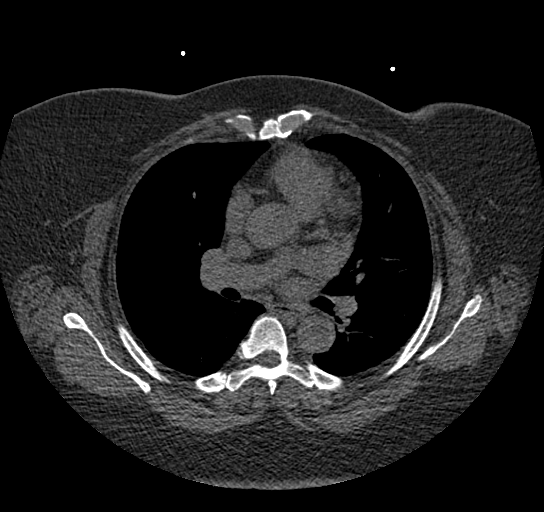

[4 of 20 positions shown; findings below may reference images not displayed]

FINDINGS: CORONARY CALCIUM SCORES:

Left Main: 0

LAD: 0

LCx: 0

RCA: 0

Total Agatston Score: 0

[HOSPITAL] percentile: 0

AORTA MEASUREMENTS:

Ascending Aorta: 32 mm

Descending Aorta: 26 mm

OTHER FINDINGS:

Heart is normal size. Aorta normal caliber. No adenopathy. No
confluent airspace opacities or effusions. No acute findings in the
upper abdomen. Chest wall soft tissues are unremarkable. No acute
bony abnormality.
IMPRESSION: No visible coronary artery calcifications. Total coronary calcium
score of 0.

No acute or significant extracardiac abnormality.

## 2022-11-23 ENCOUNTER — Other Ambulatory Visit: Payer: Self-pay | Admitting: Family Medicine

## 2022-11-23 DIAGNOSIS — I10 Essential (primary) hypertension: Secondary | ICD-10-CM

## 2022-11-25 ENCOUNTER — Telehealth: Payer: Self-pay | Admitting: Family Medicine

## 2022-11-25 NOTE — Telephone Encounter (Signed)
Prescription Request  11/25/2022  Is this a "Controlled Substance" medicine? No  LOV: Visit date not found  What is the name of the medication or equipment?  metoprolol succinate (TOPROL-XL) 50 MG 24 hr tablets  Have you contacted your pharmacy to request a refill? Yes --- pt is out of meds  Which pharmacy would you like this sent to?  [facility], [address]. [place]. [place] [address] Phone: [phone] Fax: [phone]    Patient notified that their request is being sent to the clinical staff for review and that they should receive a response within 2 business days.   Please advise at Mobile [phone] (mobile)

## 2022-11-26 NOTE — Telephone Encounter (Signed)
Rx already sent.

## 2022-12-23 ENCOUNTER — Other Ambulatory Visit: Payer: Self-pay | Admitting: Family Medicine

## 2022-12-23 DIAGNOSIS — I10 Essential (primary) hypertension: Secondary | ICD-10-CM

## 2022-12-30 DIAGNOSIS — E119 Type 2 diabetes mellitus without complications: Secondary | ICD-10-CM | POA: Diagnosis not present

## 2022-12-30 DIAGNOSIS — H2513 Age-related nuclear cataract, bilateral: Secondary | ICD-10-CM | POA: Diagnosis not present

## 2022-12-30 DIAGNOSIS — H524 Presbyopia: Secondary | ICD-10-CM | POA: Diagnosis not present

## 2022-12-30 DIAGNOSIS — H5203 Hypermetropia, bilateral: Secondary | ICD-10-CM | POA: Diagnosis not present

## 2022-12-30 DIAGNOSIS — Z7984 Long term (current) use of oral hypoglycemic drugs: Secondary | ICD-10-CM | POA: Diagnosis not present

## 2023-01-03 ENCOUNTER — Other Ambulatory Visit: Payer: Self-pay | Admitting: Family Medicine

## 2023-01-03 DIAGNOSIS — I10 Essential (primary) hypertension: Secondary | ICD-10-CM

## 2023-01-09 ENCOUNTER — Other Ambulatory Visit (HOSPITAL_COMMUNITY): Payer: Self-pay

## 2023-01-09 DIAGNOSIS — E039 Hypothyroidism, unspecified: Secondary | ICD-10-CM | POA: Diagnosis not present

## 2023-01-09 DIAGNOSIS — E1165 Type 2 diabetes mellitus with hyperglycemia: Secondary | ICD-10-CM | POA: Diagnosis not present

## 2023-01-09 DIAGNOSIS — I10 Essential (primary) hypertension: Secondary | ICD-10-CM | POA: Diagnosis not present

## 2023-01-09 DIAGNOSIS — E7800 Pure hypercholesterolemia, unspecified: Secondary | ICD-10-CM | POA: Diagnosis not present

## 2023-01-09 MED ORDER — MOUNJARO 7.5 MG/0.5ML ~~LOC~~ SOAJ
7.5000 mg | SUBCUTANEOUS | 5 refills | Status: DC
  Filled 2023-01-09 (×2): qty 6, 84d supply, fill #0
  Filled 2023-01-10: qty 2, 28d supply, fill #0
  Filled 2023-02-05: qty 2, 28d supply, fill #1
  Filled 2023-03-05: qty 2, 28d supply, fill #2
  Filled 2023-04-02: qty 2, 28d supply, fill #3
  Filled 2023-05-06: qty 2, 28d supply, fill #4
  Filled 2023-06-10: qty 2, 28d supply, fill #5
  Filled 2023-07-08: qty 2, 28d supply, fill #6
  Filled 2023-10-02: qty 2, 28d supply, fill #7
  Filled 2023-11-01: qty 2, 28d supply, fill #8
  Filled 2023-11-30: qty 2, 28d supply, fill #9
  Filled 2023-12-01: qty 2, 28d supply, fill #0
  Filled 2023-12-27: qty 2, 28d supply, fill #1

## 2023-01-10 ENCOUNTER — Other Ambulatory Visit: Payer: Self-pay | Admitting: Family Medicine

## 2023-01-10 ENCOUNTER — Other Ambulatory Visit (HOSPITAL_COMMUNITY): Payer: Self-pay

## 2023-01-10 DIAGNOSIS — I10 Essential (primary) hypertension: Secondary | ICD-10-CM

## 2023-01-22 ENCOUNTER — Other Ambulatory Visit: Payer: Self-pay | Admitting: Family Medicine

## 2023-01-22 DIAGNOSIS — I10 Essential (primary) hypertension: Secondary | ICD-10-CM

## 2023-01-27 ENCOUNTER — Telehealth: Payer: Self-pay | Admitting: Family Medicine

## 2023-01-27 DIAGNOSIS — Z23 Encounter for immunization: Secondary | ICD-10-CM | POA: Diagnosis not present

## 2023-01-27 NOTE — Telephone Encounter (Signed)
Pt called to follow up on an issue with her metoprolol. She said [facility] was supposed to call about the medication. Advised that [facility] called this morning about it. Pt was taking 3 per day and her prescription was written for 90 days for 1 pill per day so now she has run out and insurance will not allow her to fill. Please call pharmacy to clarify prescription. Pt has been without pill for 3 days now.

## 2023-01-27 NOTE — Telephone Encounter (Signed)
Monique with pharmacy calling to ask some questions about metoprolol 50 mg ER. Please call her back at [phone]

## 2023-01-28 ENCOUNTER — Other Ambulatory Visit: Payer: Self-pay

## 2023-01-28 DIAGNOSIS — I10 Essential (primary) hypertension: Secondary | ICD-10-CM

## 2023-01-28 MED ORDER — METOPROLOL SUCCINATE ER 50 MG PO TB24: ORAL_TABLET | ORAL | 0 refills | Status: DC

## 2023-01-28 NOTE — Telephone Encounter (Signed)
Who Is Calling Patient / Member / Family / Caregiver Call Type Triage / Clinical Relationship To Patient Self Return Phone Number [phone] (Primary) Chief Complaint Medication Question (non symptomatic) Reason for Call Medication Question / Request Initial Comment Caller states that she had a prescription that she took last dosage friday. Caller states that she unable to get fill. Caller states that she was suppose to be 3 a day. Caller states that when they filled it for 90 days. Caller states that it told states that it was suppose to been 1 a day not a 3 day. Caller states that it was basically 30 days supply because she was taking 3 a day not 1. Caller states that its her metoprolol. Caller states she called the office but yet the pharmacy has not have received it. Caller states she has been 3 days without her Translation No Nurse Assessment Nurse: Shawnie Dapper, RN, [facility] Date/Time (Eastern Time): 01/27/2023 7:11:53 PM Please select the assessment type ---Refill Additional Documentation ---Caller states that her metoprolol was only sent for 30 day supply when she normally gets 90 day supply due to taking it 3 times a day. Caller states that she has tried to reach out to the office a couple of times today for refill but pharmacy never received anything. Denies new or worsening symptoms at this time. Does the patient have enough medication to last until the office opens? ---Unable to obtain loaner dose from Pharmacy Does the client directives allow for assistance with medications after hours? ---No Disp. Time Lamount Cohen Time) Disposition Final User 01/27/2023 7:18:24 PM Clinical Call Yes Shawnie Dapper RN, [facility] Final Disposition 01/27/2023 7:18:24 PM Clinical Call Yes Shawnie Dapper, RN, [facility]

## 2023-01-28 NOTE — Telephone Encounter (Signed)
See other phone note

## 2023-01-28 NOTE — Telephone Encounter (Signed)
Spoke with pharmacy. New Rx sent with correct sig. Rx only filled for 30 days. Pt needs appointment. Pharmacy made aware.

## 2023-02-06 ENCOUNTER — Other Ambulatory Visit (HOSPITAL_COMMUNITY): Payer: Self-pay

## 2023-02-10 ENCOUNTER — Other Ambulatory Visit (HOSPITAL_COMMUNITY): Payer: Self-pay

## 2023-02-13 ENCOUNTER — Encounter: Payer: Self-pay | Admitting: Family Medicine

## 2023-02-13 ENCOUNTER — Ambulatory Visit (INDEPENDENT_AMBULATORY_CARE_PROVIDER_SITE_OTHER): Payer: 59 | Admitting: Family Medicine

## 2023-02-13 VITALS — BP 120/80 | HR 68 | Temp 98.0°F | Resp 18 | Ht 65.0 in | Wt 244.8 lb

## 2023-02-13 DIAGNOSIS — I890 Lymphedema, not elsewhere classified: Secondary | ICD-10-CM | POA: Diagnosis not present

## 2023-02-13 DIAGNOSIS — E1165 Type 2 diabetes mellitus with hyperglycemia: Secondary | ICD-10-CM | POA: Diagnosis not present

## 2023-02-13 DIAGNOSIS — E559 Vitamin D deficiency, unspecified: Secondary | ICD-10-CM

## 2023-02-13 DIAGNOSIS — I10 Essential (primary) hypertension: Secondary | ICD-10-CM

## 2023-02-13 DIAGNOSIS — E039 Hypothyroidism, unspecified: Secondary | ICD-10-CM | POA: Diagnosis not present

## 2023-02-13 DIAGNOSIS — E785 Hyperlipidemia, unspecified: Secondary | ICD-10-CM | POA: Diagnosis not present

## 2023-02-13 DIAGNOSIS — Z0000 Encounter for general adult medical examination without abnormal findings: Secondary | ICD-10-CM

## 2023-02-13 DIAGNOSIS — E119 Type 2 diabetes mellitus without complications: Secondary | ICD-10-CM

## 2023-02-13 LAB — CBC WITH DIFFERENTIAL/PLATELET
Basophils Absolute: 0 10*3/uL (ref 0.0–0.1)
Basophils Relative: 0.6 % (ref 0.0–3.0)
Eosinophils Absolute: 0.1 10*3/uL (ref 0.0–0.7)
Eosinophils Relative: 1.6 % (ref 0.0–5.0)
HCT: 43.1 % (ref 36.0–46.0)
Hemoglobin: 14.4 g/dL (ref 12.0–15.0)
Lymphocytes Relative: 34.1 % (ref 12.0–46.0)
Lymphs Abs: 1.6 10*3/uL (ref 0.7–4.0)
MCHC: 33.4 g/dL (ref 30.0–36.0)
MCV: 93.6 fl (ref 78.0–100.0)
Monocytes Absolute: 0.4 10*3/uL (ref 0.1–1.0)
Monocytes Relative: 8.6 % (ref 3.0–12.0)
Neutro Abs: 2.5 10*3/uL (ref 1.4–7.7)
Neutrophils Relative %: 55.1 % (ref 43.0–77.0)
Platelets: 199 10*3/uL (ref 150.0–400.0)
RBC: 4.6 Mil/uL (ref 3.87–5.11)
RDW: 15.5 % (ref 11.5–15.5)
WBC: 4.6 10*3/uL (ref 4.0–10.5)

## 2023-02-13 LAB — COMPREHENSIVE METABOLIC PANEL
ALT: 20 U/L (ref 0–35)
AST: 20 U/L (ref 0–37)
Albumin: 4 g/dL (ref 3.5–5.2)
Alkaline Phosphatase: 71 U/L (ref 39–117)
BUN: 18 mg/dL (ref 6–23)
CO2: 30 mEq/L (ref 19–32)
Calcium: 9.2 mg/dL (ref 8.4–10.5)
Chloride: 102 mEq/L (ref 96–112)
Creatinine, Ser: 0.91 mg/dL (ref 0.40–1.20)
GFR: 65.48 mL/min (ref >60.00)
Glucose, Bld: 81 mg/dL (ref 70–99)
Potassium: 3.9 mEq/L (ref 3.5–5.1)
Sodium: 141 mEq/L (ref 135–145)
Total Bilirubin: 0.4 mg/dL (ref 0.2–1.2)
Total Protein: 7.1 g/dL (ref 6.0–8.3)

## 2023-02-13 LAB — LIPID PANEL
Cholesterol: 139 mg/dL (ref 0–200)
HDL: 39.4 mg/dL (ref >39.00)
LDL Cholesterol: 87 mg/dL (ref 0–99)
NonHDL: 99.29
Total CHOL/HDL Ratio: 4
Triglycerides: 61 mg/dL (ref 0.0–149.0)
VLDL: 12.2 mg/dL (ref 0.0–40.0)

## 2023-02-13 LAB — TSH: TSH: 0.3 u[IU]/mL — ABNORMAL LOW (ref 0.35–5.50)

## 2023-02-13 LAB — MICROALBUMIN / CREATININE URINE RATIO
Creatinine,U: 565.4 mg/dL
Microalb Creat Ratio: 1.8 mg/g (ref 0.0–30.0)
Microalb, Ur: 10.4 mg/dL — ABNORMAL HIGH (ref 0.0–1.9)

## 2023-02-13 LAB — VITAMIN D 25 HYDROXY (VIT D DEFICIENCY, FRACTURES): VITD: 39.79 ng/mL (ref 30.00–100.00)

## 2023-02-13 LAB — HEMOGLOBIN A1C: Hgb A1c MFr Bld: 5.6 % (ref 4.6–6.5)

## 2023-02-13 MED ORDER — NONFORMULARY OR COMPOUNDED ITEM
2 refills | Status: AC
Start: 2023-02-13 — End: ?

## 2023-02-13 NOTE — Assessment & Plan Note (Signed)
Tolerating statin, encouraged heart healthy diet, avoid trans fats, minimize simple carbs and saturated fats. Increase exercise as tolerated 

## 2023-02-13 NOTE — Assessment & Plan Note (Signed)
Ghm utd Check labs  See AVS Health Maintenance  Topic Date Due   HEMOGLOBIN A1C  03/21/2022   MAMMOGRAM  08/27/2022   Diabetic kidney evaluation - eGFR measurement  09/20/2022   Diabetic kidney evaluation - Urine ACR  09/20/2022   FOOT EXAM  09/20/2022   COVID-19 Vaccine (6 - 2023-24 season) 09/25/2022   INFLUENZA VACCINE  05/08/2023   DEXA SCAN  10/30/2023   OPHTHALMOLOGY EXAM  02/13/2024   DTaP/Tdap/Td (3 - Td or Tdap) 04/20/2024   COLONOSCOPY (Pts 45-50yrs Insurance coverage will need to be confirmed)  05/28/2027   Pneumonia Vaccine 15+ Years old  Completed   Hepatitis C Screening  Completed   Zoster Vaccines- Shingrix  Completed   HPV VACCINES  Aged Out

## 2023-02-13 NOTE — Assessment & Plan Note (Signed)
Per endo °

## 2023-02-13 NOTE — Assessment & Plan Note (Signed)
On synthroid Per dr Talmage Nap

## 2023-02-13 NOTE — Assessment & Plan Note (Signed)
Well controlled, no changes to meds. Encouraged heart healthy diet such as the DASH diet and exercise as tolerated.  °

## 2023-02-13 NOTE — Progress Notes (Signed)
Subjective:   By signing my name below, I, [name], attest that this documentation has been prepared under the direction and in the presence of Donato Schultz, DO. 02/13/2023   Patient ID: Tracey Page, female    DOB: 1956/09/09, 67 y.o.   MRN: [id number]  Chief Complaint  Patient presents with   Annual Exam    Pt states fasting     HPI Patient is in today for a comprehensive physical exam.   She complains of right ear fullness.  Her GYN specialist is retiring and she is planning on finding another provider.  She reports developing new skin tags. They do not bother her at this time.  She continues having occasional swelling in her lower extremities.  She denies fever, new moles, congestion, sinus pain, sore throat, chest pain, palpitations, cough, shortness of breath, wheezing, nausea, vomiting, abdominal pain, diarrhea, constipation, dysuria, frequency, hematuria, new muscle pain, new joint pain, or headaches at this time.  She has no changes to family medical history. She has no new surgical procedures. She continues smoking tobacco products. She smokes 1 pack every 3 weeks. She smoked for the past 10 years.  She is exercising regularly by walking 1 mile 3x weekly.  Bone density was last completed 10/29/2021. Results are normal.  Mammogram was last completed 08/27/2021.  Colonoscopy was last completed 05/27/2022. Results showed: - Two 3 to 8 mm polyps in the sigmoid colon and in the cecum, removed with a cold snare. Resected and retrieved. - Diverticulosis in the entire examined colon. - The examination was otherwise normal on direct and retroflexion views. - Personal history of colonic polyps. adenomas x 2 max 15 mm ( Dr. Christella Hartigan) 2020 She is UTD on vision care.   Past Medical History:  Diagnosis Date   Anxiety    Bilateral swelling of feet and ankles    Diabetes mellitus without complication (HCC)    Diverticulitis    Food allergy    Hx of adenomatous colonic  polyps 12/2018   Hyperlipidemia    Hypertension    Hypothyroidism    Lymphedema    Obesity    Pre-diabetes    Thyroid disease     Past Surgical History:  Procedure Laterality Date   COLONOSCOPY WITH PROPOFOL N/A 12/17/2018   Procedure: COLONOSCOPY WITH PROPOFOL;  Surgeon: Rachael Fee, MD;  Location: [facility];  Service: Endoscopy;  Laterality: N/A;   ECTOPIC PREGNANCY SURGERY     POLYPECTOMY  12/17/2018   Procedure: POLYPECTOMY;  Surgeon: Rachael Fee, MD;  Location: [facility];  Service: Endoscopy;;    Family History  Problem Relation Age of Onset   Arthritis Mother    Hypertension Mother    Heart disease Mother    Anxiety disorder Mother    Thyroid disease Mother    Obesity Mother    Aneurysm Father    Sudden death Father    Diabetes Maternal Grandfather    Hyperlipidemia Other    Hypertension Other    Coronary artery disease Other    Colon cancer Neg Hx    Colon polyps Neg Hx    Esophageal cancer Neg Hx    Stomach cancer Neg Hx    Rectal cancer Neg Hx     Social History   Socioeconomic History   Marital status: Single    Spouse name: Not on file   Number of children: Not on file   Years of education: Not on file   Highest education  level: Not on file  Occupational History   Occupation: retired   Occupation: Art therapist  Tobacco Use   Smoking status: Some Days    Packs/day: 0.30    Years: 10.00    Additional pack years: 0.00    Total pack years: 3.00    Types: Cigarettes   Smokeless tobacco: Never  Vaping Use   Vaping Use: Never used  Substance and Sexual Activity   Alcohol use: Yes    Comment: socially, occ wine   Drug use: No   Sexual activity: Yes    Birth control/protection: Post-menopausal  Other Topics Concern   Not on file  Social History Narrative   Exercise--- walking 1 mile 3x a week    Social Determinants of Health   Financial Resource Strain: Not on file  Food Insecurity: Not on file  Transportation Needs: Not  on file  Physical Activity: Not on file  Stress: Not on file  Social Connections: Not on file  Intimate Partner Violence: Not on file    Outpatient Medications Prior to Visit  Medication Sig Dispense Refill   amLODipine (NORVASC) 10 MG tablet Take 1 tablet by mouth once daily 90 tablet 0   azelastine (ASTELIN) 0.1 % nasal spray Place 1 spray into both nostrils 2 (two) times daily. Use in each nostril as directed 30 mL 12   blood glucose meter kit and supplies KIT Dispense based on patient and insurance preference. Use up to four times daily as directed. (FOR ICD-9 250.00, 250.01). 1 each 0   Blood Glucose Monitoring Suppl (ONE TOUCH ULTRA 2) w/Device KIT 1 kit by Does not apply route daily. 1 kit 0   EPINEPHrine 0.3 mg/0.3 mL IJ SOAJ injection INJECT CONTENTS OF 1 PEN AS NEEDED FOR ALLERGIC REACTION     furosemide (LASIX) 20 MG tablet TAKE 1 TO 2 TABLETS BY MOUTH ONCE DAILY AS NEEDED FOR EDEMA 60 tablet 0   glucose blood (ONETOUCH ULTRA) test strip Use as instructed 100 each 0   hydrocortisone (ANUSOL-HC) 2.5 % rectal cream Place 1 application. rectally 2 (two) times daily. 30 g 0   hydrocortisone (ANUSOL-HC) 25 MG suppository Place 1 suppository (25 mg total) rectally 2 (two) times daily. 12 suppository 0   levothyroxine (SYNTHROID) 125 MCG tablet Take 125 mcg by mouth every morning.     metoprolol succinate (TOPROL-XL) 50 MG 24 hr tablet TAKE 3 TABLETS BY MOUTH ONCE DAILY WITH FOOD IMMEDIATELY  FOLLOWING  A  MEAL. Patient needs office visit for further refills 90 tablet 0   NONFORMULARY OR COMPOUNDED ITEM Compression socks  30-40 mmhg  #1  As directed 1 each 0   NONFORMULARY OR COMPOUNDED ITEM Compression socks   20-30 mm/ hg  #1  as directed for edema 1 each 0   OneTouch Delica Lancets 33G MISC 1 each by Does not apply route daily. 100 each 0   pramoxine-hydrocortisone (PROCTOCREAM-HC) 1-1 % rectal cream Place 1 application rectally 2 (two) times daily. 30 g 0   rosuvastatin (CRESTOR) 40  MG tablet Take 40 mg by mouth daily.     tirzepatide (MOUNJARO) 7.5 MG/0.5ML Pen INJECT 1/2 (ONE-HALF) ML  ONCE A WEEK 12 mL 1   tirzepatide (MOUNJARO) 7.5 MG/0.5ML Pen Inject 7.5 mg into the skin every 7 (seven) days. 6 mL 5   Vitamin D, Ergocalciferol, (DRISDOL) 1.25 MG (50000 UNIT) CAPS capsule Take 1 capsule by mouth once a week 12 capsule 0   COVID-19 mRNA vaccine 2023-2024 (COMIRNATY) syringe  Inject into the muscle. (Patient not taking: Reported on 02/13/2023) 0.3 mL 0   No facility-administered medications prior to visit.    Allergies  Allergen Reactions   Diovan [Valsartan] Swelling and Other (See Comments)    angioedema   Egg-Derived Products Swelling   Iodine Swelling   Lisinopril Swelling   Misc. Sulfonamide Containing Compounds    Shellfish Allergy Swelling    Swelling of tongue   Sulfonamide Derivatives Swelling    Swelling in hand/knees    Review of Systems  Constitutional:  Negative for fever and malaise/fatigue.  HENT:  Negative for congestion, sinus pain and sore throat.        (+)right ear fullness  Eyes:  Negative for blurred vision.  Respiratory:  Negative for cough, shortness of breath and wheezing.   Cardiovascular:  Negative for chest pain, palpitations and leg swelling.  Gastrointestinal:  Negative for abdominal pain, blood in stool, constipation, diarrhea, nausea and vomiting.  Genitourinary:  Negative for dysuria, frequency and hematuria.  Musculoskeletal:  Negative for falls.       (-)new muscle pain (-)new joint pain  Skin:  Negative for rash.       (-)New moles (+)new skin tags  Neurological:  Negative for dizziness, loss of consciousness and headaches.  Endo/Heme/Allergies:  Negative for environmental allergies.  Psychiatric/Behavioral:  Negative for depression. The patient is not nervous/anxious.        Objective:    Physical Exam Vitals and nursing note reviewed.  Constitutional:      General: She is not in acute distress.    Appearance:  Normal appearance. She is not ill-appearing.  HENT:     Head: Normocephalic and atraumatic.     Right Ear: Tympanic membrane, ear canal and external ear normal.     Left Ear: Tympanic membrane, ear canal and external ear normal.  Eyes:     Extraocular Movements: Extraocular movements intact.     Pupils: Pupils are equal, round, and reactive to light.  Cardiovascular:     Rate and Rhythm: Normal rate and regular rhythm.     Heart sounds: Normal heart sounds. No murmur heard.    No gallop.  Pulmonary:     Effort: Pulmonary effort is normal. No respiratory distress.     Breath sounds: Normal breath sounds. No wheezing or rales.  Abdominal:     General: Bowel sounds are normal. There is no distension.     Palpations: Abdomen is soft.     Tenderness: There is no abdominal tenderness. There is no guarding.  Musculoskeletal:        General: Normal range of motion.     Cervical back: Normal range of motion.  Skin:    General: Skin is warm and dry.  Neurological:     General: No focal deficit present.     Mental Status: She is alert and oriented to person, place, and time.     Motor: No weakness.     Gait: Gait normal.  Psychiatric:        Mood and Affect: Mood normal.        Behavior: Behavior normal.        Thought Content: Thought content normal.        Judgment: Judgment normal.     BP 120/80 (BP Location: Right Arm, Patient Position: Sitting, Cuff Size: Large)   Pulse 68   Temp 98 F (36.7 C) (Oral)   Resp 18   Ht 5\' 5"  (1.651 m)   Wt  244 lb 12.8 oz (111 kg)   SpO2 98%   BMI 40.74 kg/m  Wt Readings from Last 3 Encounters:  02/13/23 244 lb 12.8 oz (111 kg)  05/27/22 279 lb 3.2 oz (126.6 kg)  04/29/22 279 lb 3.2 oz (126.6 kg)       Assessment & Plan:  Preventative health care Assessment & Plan: Ghm utd Check labs  See AVS Health Maintenance  Topic Date Due   HEMOGLOBIN A1C  03/21/2022   MAMMOGRAM  08/27/2022   Diabetic kidney evaluation - eGFR measurement   09/20/2022   Diabetic kidney evaluation - Urine ACR  09/20/2022   FOOT EXAM  09/20/2022   COVID-19 Vaccine (6 - 2023-24 season) 09/25/2022   INFLUENZA VACCINE  05/08/2023   DEXA SCAN  10/30/2023   OPHTHALMOLOGY EXAM  02/13/2024   DTaP/Tdap/Td (3 - Td or Tdap) 04/20/2024   COLONOSCOPY (Pts 45-59yrs Insurance coverage will need to be confirmed)  05/28/2027   Pneumonia Vaccine 19+ Years old  Completed   Hepatitis C Screening  Completed   Zoster Vaccines- Shingrix  Completed   HPV VACCINES  Aged Out      Essential hypertension Assessment & Plan: Well controlled, no changes to meds. Encouraged heart healthy diet such as the DASH diet and exercise as tolerated.    Orders: -     Lipid panel -     CBC with Differential/Platelet -     Comprehensive metabolic panel -     Hemoglobin A1c -     Microalbumin / creatinine urine ratio  Type 2 diabetes mellitus with hyperglycemia, without long-term current use of insulin (HCC) -     Comprehensive metabolic panel -     Hemoglobin A1c  Hyperlipidemia, unspecified hyperlipidemia type -     Lipid panel -     Comprehensive metabolic panel  Hypothyroidism, unspecified type Assessment & Plan: On synthroid Per dr balan  Orders: -     TSH  Vitamin D deficiency -     VITAMIN D 25 Hydroxy (Vit-D Deficiency, Fractures)  Lymphedema -     Ambulatory referral to Physical Therapy -     NONFORMULARY OR COMPOUNDED ITEM; compression socks 20-30 mm/hg  Dx lymphedema  Dispense: 1 each; Refill: 2  Type 2 diabetes mellitus without complication, without long-term current use of insulin (HCC) Assessment & Plan: Per endo   Hyperlipidemia LDL goal <100 Assessment & Plan: Tolerating statin, encouraged heart healthy diet, avoid trans fats, minimize simple carbs and saturated fats. Increase exercise as tolerated      I, Donato Schultz, DO, personally preformed the services described in this documentation.  All medical record entries made by  the scribe were at my direction and in my presence.  I have reviewed the chart and discharge instructions (if applicable) and agree that the record reflects my personal performance and is accurate and complete. 02/13/2023   I,[name],acting as a scribe for Donato Schultz, DO.,have documented all relevant documentation on the behalf of Donato Schultz, DO,as directed by  Donato Schultz, DO while in the presence of Donato Schultz, DO.   Donato Schultz, DO

## 2023-02-23 ENCOUNTER — Other Ambulatory Visit: Payer: Self-pay | Admitting: Family Medicine

## 2023-02-23 DIAGNOSIS — I10 Essential (primary) hypertension: Secondary | ICD-10-CM

## 2023-02-25 DIAGNOSIS — N951 Menopausal and female climacteric states: Secondary | ICD-10-CM | POA: Diagnosis not present

## 2023-02-25 DIAGNOSIS — Z124 Encounter for screening for malignant neoplasm of cervix: Secondary | ICD-10-CM | POA: Diagnosis not present

## 2023-02-25 DIAGNOSIS — Z01419 Encounter for gynecological examination (general) (routine) without abnormal findings: Secondary | ICD-10-CM | POA: Diagnosis not present

## 2023-02-25 DIAGNOSIS — Z1231 Encounter for screening mammogram for malignant neoplasm of breast: Secondary | ICD-10-CM | POA: Diagnosis not present

## 2023-02-25 DIAGNOSIS — N840 Polyp of corpus uteri: Secondary | ICD-10-CM | POA: Diagnosis not present

## 2023-02-25 DIAGNOSIS — Z1389 Encounter for screening for other disorder: Secondary | ICD-10-CM | POA: Diagnosis not present

## 2023-02-26 ENCOUNTER — Other Ambulatory Visit: Payer: Self-pay | Admitting: Family Medicine

## 2023-02-26 DIAGNOSIS — I10 Essential (primary) hypertension: Secondary | ICD-10-CM

## 2023-02-28 DIAGNOSIS — E039 Hypothyroidism, unspecified: Secondary | ICD-10-CM | POA: Diagnosis not present

## 2023-03-11 ENCOUNTER — Encounter (HOSPITAL_COMMUNITY): Payer: Self-pay

## 2023-03-11 ENCOUNTER — Other Ambulatory Visit (HOSPITAL_COMMUNITY): Payer: Self-pay

## 2023-03-12 ENCOUNTER — Other Ambulatory Visit (HOSPITAL_COMMUNITY): Payer: Self-pay

## 2023-03-12 DIAGNOSIS — N840 Polyp of corpus uteri: Secondary | ICD-10-CM | POA: Diagnosis not present

## 2023-04-02 ENCOUNTER — Other Ambulatory Visit (HOSPITAL_COMMUNITY): Payer: Self-pay

## 2023-05-06 ENCOUNTER — Other Ambulatory Visit: Payer: Self-pay

## 2023-05-06 ENCOUNTER — Other Ambulatory Visit (HOSPITAL_COMMUNITY): Payer: Self-pay

## 2023-05-09 ENCOUNTER — Other Ambulatory Visit (HOSPITAL_COMMUNITY): Payer: Self-pay

## 2023-05-12 ENCOUNTER — Other Ambulatory Visit (HOSPITAL_COMMUNITY): Payer: Self-pay

## 2023-05-19 DIAGNOSIS — N840 Polyp of corpus uteri: Secondary | ICD-10-CM | POA: Diagnosis not present

## 2023-05-19 DIAGNOSIS — R9389 Abnormal findings on diagnostic imaging of other specified body structures: Secondary | ICD-10-CM | POA: Diagnosis not present

## 2023-06-13 ENCOUNTER — Other Ambulatory Visit (HOSPITAL_COMMUNITY): Payer: Self-pay

## 2023-06-27 ENCOUNTER — Other Ambulatory Visit: Payer: Self-pay | Admitting: Family Medicine

## 2023-06-27 DIAGNOSIS — I10 Essential (primary) hypertension: Secondary | ICD-10-CM

## 2023-06-28 ENCOUNTER — Other Ambulatory Visit: Payer: Self-pay | Admitting: Family Medicine

## 2023-06-28 DIAGNOSIS — J302 Other seasonal allergic rhinitis: Secondary | ICD-10-CM

## 2023-07-08 ENCOUNTER — Other Ambulatory Visit (HOSPITAL_COMMUNITY): Payer: Self-pay

## 2023-07-11 ENCOUNTER — Encounter: Payer: Self-pay | Admitting: Medical

## 2023-07-11 ENCOUNTER — Other Ambulatory Visit (HOSPITAL_BASED_OUTPATIENT_CLINIC_OR_DEPARTMENT_OTHER): Payer: Self-pay

## 2023-07-11 ENCOUNTER — Ambulatory Visit: Payer: 59 | Admitting: Medical

## 2023-07-11 VITALS — BP 135/70 | HR 90 | Temp 98.0°F | Resp 16 | Ht 65.0 in | Wt 231.6 lb

## 2023-07-11 DIAGNOSIS — R0989 Other specified symptoms and signs involving the circulatory and respiratory systems: Secondary | ICD-10-CM | POA: Diagnosis not present

## 2023-07-11 DIAGNOSIS — J309 Allergic rhinitis, unspecified: Secondary | ICD-10-CM | POA: Diagnosis not present

## 2023-07-11 MED ORDER — IBUPROFEN 800 MG PO TABS
800.0000 mg | ORAL_TABLET | Freq: Three times a day (TID) | ORAL | 1 refills | Status: AC | PRN
  Filled 2023-11-01: qty 30, 10d supply, fill #0

## 2023-07-11 MED ORDER — BENZONATATE 100 MG PO CAPS
100.0000 mg | ORAL_CAPSULE | Freq: Three times a day (TID) | ORAL | 0 refills | Status: DC | PRN
  Filled 2023-07-11: qty 30, 10d supply, fill #0

## 2023-07-11 MED ORDER — MOUNJARO 7.5 MG/0.5ML ~~LOC~~ SOAJ: 7.5000 mg | SUBCUTANEOUS | 0 refills | Status: DC

## 2023-07-11 MED ORDER — LEVOTHYROXINE SODIUM 112 MCG PO TABS: 112.0000 ug | ORAL_TABLET | Freq: Every morning | ORAL | 5 refills | Status: DC

## 2023-07-11 MED ORDER — LEVOCETIRIZINE DIHYDROCHLORIDE 5 MG PO TABS
5.0000 mg | ORAL_TABLET | Freq: Every evening | ORAL | 0 refills | Status: DC
  Filled 2023-07-11: qty 30, 30d supply, fill #0

## 2023-07-11 MED ORDER — IPRATROPIUM BROMIDE 0.06 % NA SOLN
2.0000 | Freq: Three times a day (TID) | NASAL | 0 refills | Status: DC
  Filled 2023-07-11: qty 15, 25d supply, fill #0

## 2023-07-11 MED ORDER — LEVOTHYROXINE SODIUM 125 MCG PO TABS
125.0000 ug | ORAL_TABLET | Freq: Every morning | ORAL | 4 refills | Status: DC
  Filled 2023-10-02: qty 90, 90d supply, fill #0

## 2023-07-11 MED ORDER — ROSUVASTATIN CALCIUM 40 MG PO TABS
40.0000 mg | ORAL_TABLET | Freq: Every day | ORAL | 6 refills | Status: DC
  Filled 2023-07-13: qty 30, 30d supply, fill #0
  Filled 2023-10-02: qty 30, 30d supply, fill #1
  Filled 2023-11-01: qty 30, 30d supply, fill #2
  Filled 2023-12-20: qty 30, 30d supply, fill #3
  Filled 2024-01-21: qty 30, 30d supply, fill #4
  Filled 2024-02-24: qty 30, 30d supply, fill #5

## 2023-07-11 NOTE — Progress Notes (Signed)
Subjective:    Patient ID: Tracey Page, female    DOB: November 03, 1955, 67 y.o.   MRN: [id number]  HPI  Pt in for recent cough and left side nasal congestion and runny nose.   Pt states this has been going on for about 7 days. 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 Pt states allergies worst in spring. Much less typically in the fall.  No fever, no chills or sweats. No body aches. No sob but states mild scant wheezing intermitent. No hx of asthma.  Pt not taking any meds otc.  Pt in the past had used astelin.   Pt shows me a moist napkin in her mask from constant runny nose.  Pt checked covid test twice recently and both negative.   Review of Systems  Constitutional:  Negative for chills, fatigue and fever.  HENT:  Positive for congestion and postnasal drip. Negative for sinus pressure and sneezing.   Respiratory:  Negative for cough, chest tightness, shortness of breath and wheezing.   Cardiovascular:  Negative for chest pain and palpitations.  Gastrointestinal:  Negative for abdominal pain.  Genitourinary:  Negative for dysuria.  Musculoskeletal:  Negative for back pain.  Skin:  Negative for rash.  Hematological:  Negative for adenopathy. Does not bruise/bleed easily.  Psychiatric/Behavioral:  Negative for behavioral problems and confusion.     Past Medical History:  Diagnosis Date   Anxiety    Bilateral swelling of feet and ankles    Diabetes mellitus without complication (HCC)    Diverticulitis    Food allergy    Hx of adenomatous colonic polyps 12/2018   Hyperlipidemia    Hypertension    Hypothyroidism    Lymphedema    Obesity    Pre-diabetes    Thyroid disease      Social History   Socioeconomic History   Marital status: Single    Spouse name: Not on file   Number of children: Not on file   Years of education: Not on file   Highest education level: Not on file  Occupational History   Occupation: retired    Occupation: Art therapist  Tobacco Use   Smoking status: Some Days    Current packs/day: 0.30    Average packs/day: 0.3 packs/day for 10.0 years (3.0 ttl pk-yrs)    Types: Cigarettes   Smokeless tobacco: Never  Vaping Use   Vaping status: Never Used  Substance and Sexual Activity   Alcohol use: Yes    Comment: socially, occ wine   Drug use: No   Sexual activity: Yes    Birth control/protection: Post-menopausal  Other Topics Concern   Not on file  Social History Narrative   Exercise--- walking 1 mile 3x a week    Social Determinants of Health   Financial Resource Strain: Not on file  Food Insecurity: Not on file  Transportation Needs: Not on file  Physical Activity: Not on file  Stress: Not on file  Social Connections: Not on file  Intimate Partner Violence: Not on file    Past Surgical History:  Procedure Laterality Date   COLONOSCOPY WITH PROPOFOL N/A 12/17/2018   Procedure: COLONOSCOPY WITH PROPOFOL;  Surgeon: Rachael Fee, MD;  Location: [facility];  Service: Endoscopy;  Laterality: N/A;   ECTOPIC PREGNANCY SURGERY     POLYPECTOMY  12/17/2018   Procedure: POLYPECTOMY;  Surgeon: Rachael Fee, MD;  Location: [facility];  Service: Endoscopy;;    Family History  Problem  Relation Age of Onset   Arthritis Mother    Hypertension Mother    Heart disease Mother    Anxiety disorder Mother    Thyroid disease Mother    Obesity Mother    Aneurysm Father    Sudden death Father    Diabetes Maternal Grandfather    Hyperlipidemia Other    Hypertension Other    Coronary artery disease Other    Colon cancer Neg Hx    Colon polyps Neg Hx    Esophageal cancer Neg Hx    Stomach cancer Neg Hx    Rectal cancer Neg Hx     Allergies  Allergen Reactions   Diovan [Valsartan] Swelling and Other (See Comments)    angioedema   Egg-Derived Products Swelling   Iodine Swelling   Lisinopril Swelling   Misc. Sulfonamide Containing Compounds    Shellfish Allergy  Swelling    Swelling of tongue   Sulfonamide Derivatives Swelling    Swelling in hand/knees    Current Outpatient Medications on File Prior to Visit  Medication Sig Dispense Refill   amLODipine (NORVASC) 10 MG tablet Take 1 tablet by mouth once daily 90 tablet 1   Azelastine HCl 137 MCG/SPRAY SOLN USE 1 SPRAY(S) IN EACH NOSTRIL TWICE DAILY AS DIRECTED 30 mL 0   blood glucose meter kit and supplies KIT Dispense based on patient and insurance preference. Use up to four times daily as directed. (FOR ICD-9 250.00, 250.01). 1 each 0   Blood Glucose Monitoring Suppl (ONE TOUCH ULTRA 2) w/Device KIT 1 kit by Does not apply route daily. 1 kit 0   COVID-19 mRNA vaccine 2023-2024 (COMIRNATY) syringe Inject into the muscle. 0.3 mL 0   EPINEPHrine 0.3 mg/0.3 mL IJ SOAJ injection INJECT CONTENTS OF 1 PEN AS NEEDED FOR ALLERGIC REACTION     furosemide (LASIX) 20 MG tablet TAKE 1 TO 2 TABLETS BY MOUTH ONCE DAILY AS NEEDED FOR EDEMA 60 tablet 0   glucose blood (ONETOUCH ULTRA) test strip Use as instructed 100 each 0   hydrocortisone (ANUSOL-HC) 2.5 % rectal cream Place 1 application. rectally 2 (two) times daily. 30 g 0   hydrocortisone (ANUSOL-HC) 25 MG suppository Place 1 suppository (25 mg total) rectally 2 (two) times daily. 12 suppository 0   levothyroxine (SYNTHROID) 125 MCG tablet Take 125 mcg by mouth every morning.     metoprolol succinate (TOPROL-XL) 50 MG 24 hr tablet TAKE 3 TABLETS BY MOUTH ONCE DAILY WITH FOOD OR IMMEDIATELY FOLLOWING A MEAL 180 tablet 1   NONFORMULARY OR COMPOUNDED ITEM Compression socks  30-40 mmhg  #1  As directed 1 each 0   NONFORMULARY OR COMPOUNDED ITEM Compression socks   20-30 mm/ hg  #1  as directed for edema 1 each 0   NONFORMULARY OR COMPOUNDED ITEM compression socks 20-30 mm/hg  Dx lymphedema 1 each 2   OneTouch Delica Lancets 33G MISC 1 each by Does not apply route daily. 100 each 0   pramoxine-hydrocortisone (PROCTOCREAM-HC) 1-1 % rectal cream Place 1 application  rectally 2 (two) times daily. 30 g 0   rosuvastatin (CRESTOR) 40 MG tablet Take 40 mg by mouth daily.     tirzepatide (MOUNJARO) 7.5 MG/0.5ML Pen INJECT 1/2 (ONE-HALF) ML  ONCE A WEEK 12 mL 1   tirzepatide (MOUNJARO) 7.5 MG/0.5ML Pen Inject 7.5 mg into the skin every 7 (seven) days. 6 mL 5   Vitamin D, Ergocalciferol, (DRISDOL) 1.25 MG (50000 UNIT) CAPS capsule Take 1 capsule by mouth once a  week 12 capsule 0   No current facility-administered medications on file prior to visit.    BP (!) 135/57 (BP Location: Left Arm, Patient Position: Sitting, Cuff Size: Normal)   Pulse 90   Temp 98 F (36.7 C) (Oral)   Resp 16   Ht 5\' 5"  (1.651 m)   Wt 231 lb 9.6 oz (105.1 kg)   SpO2 95%   BMI 38.54 kg/m        Objective:   Physical Exam  General Mental Status- Alert. General Appearance- Not in acute distress.    Chest and Lung Exam Auscultation: Breath Sounds:-Normal.  Cardiovascular Auscultation:Rythm- Regular. Murmurs & Other Heart Sounds:Auscultation of the heart reveals- No Murmurs.  Neurologic Cranial Nerve exam:- CN III-XII intact(No nystagmus), symmetric smile. Strength:- 5/5 equal and symmetric strength both upper and lower extremities.   Heent- no sinus pressure. Boggy red turbinates. +pnd. Left side nares very moist and clear drainage.    Assessment & Plan:  Allergic rhinitis with severe runny nose. - use xyzal antihistamin at night. -use ipratropium spray for runny nose if covered by insurance. -if using ipratropium stop astelin. - if getting worse as discussed let us know and could give antibiotic but not indicated presently. -benzonatate for cough  Follow up 10-14 days or sooner if needed.  Esperanza Richters, PA-C

## 2023-07-11 NOTE — Patient Instructions (Addendum)
Allergic rhinitis with severe runny nose. - use xyzal antihistamine at night. -use ipratropium spray for runny nose if covered by insurance. -if using ipratropium stop astelin. - if getting worse as discussed let us know and could give antibiotic but not indicated presently. -benzonatate for cough  Follow up 10-14 days or sooner if needed.

## 2023-07-14 ENCOUNTER — Other Ambulatory Visit (HOSPITAL_BASED_OUTPATIENT_CLINIC_OR_DEPARTMENT_OTHER): Payer: Self-pay

## 2023-07-30 ENCOUNTER — Other Ambulatory Visit (HOSPITAL_BASED_OUTPATIENT_CLINIC_OR_DEPARTMENT_OTHER): Payer: Self-pay

## 2023-08-06 ENCOUNTER — Other Ambulatory Visit (HOSPITAL_BASED_OUTPATIENT_CLINIC_OR_DEPARTMENT_OTHER): Payer: Self-pay

## 2023-08-06 ENCOUNTER — Other Ambulatory Visit: Payer: Self-pay | Admitting: Family Medicine

## 2023-08-06 DIAGNOSIS — E559 Vitamin D deficiency, unspecified: Secondary | ICD-10-CM

## 2023-08-06 MED ORDER — EPINEPHRINE 0.3 MG/0.3ML IJ SOAJ
0.3000 mg | INTRAMUSCULAR | 3 refills | Status: AC | PRN
  Filled 2023-08-06: qty 2, 30d supply, fill #0
  Filled 2024-04-28: qty 2, 30d supply, fill #1

## 2023-08-28 ENCOUNTER — Other Ambulatory Visit (HOSPITAL_BASED_OUTPATIENT_CLINIC_OR_DEPARTMENT_OTHER): Payer: Self-pay

## 2023-08-28 MED FILL — Metoprolol Succinate Tab ER 24HR 50 MG (Tartrate Equiv): ORAL | 60 days supply | Qty: 180 | Fill #0 | Status: AC

## 2023-08-29 ENCOUNTER — Other Ambulatory Visit (HOSPITAL_BASED_OUTPATIENT_CLINIC_OR_DEPARTMENT_OTHER): Payer: Self-pay

## 2023-08-29 MED ORDER — COVID-19 MRNA VAC-TRIS(PFIZER) 30 MCG/0.3ML IM SUSY
0.3000 mL | PREFILLED_SYRINGE | Freq: Once | INTRAMUSCULAR | 0 refills | Status: AC
  Filled 2023-08-29: qty 0.3, 1d supply, fill #0

## 2023-08-29 MED ORDER — INFLUENZA VAC A&B SURF ANT ADJ 0.5 ML IM SUSY
0.5000 mL | PREFILLED_SYRINGE | Freq: Once | INTRAMUSCULAR | 0 refills | Status: AC
  Filled 2023-08-29: qty 0.5, 1d supply, fill #0

## 2023-09-01 ENCOUNTER — Other Ambulatory Visit (HOSPITAL_BASED_OUTPATIENT_CLINIC_OR_DEPARTMENT_OTHER): Payer: Self-pay

## 2023-10-02 ENCOUNTER — Other Ambulatory Visit: Payer: Self-pay

## 2023-10-02 ENCOUNTER — Other Ambulatory Visit (HOSPITAL_BASED_OUTPATIENT_CLINIC_OR_DEPARTMENT_OTHER): Payer: Self-pay

## 2023-10-21 ENCOUNTER — Other Ambulatory Visit: Payer: Self-pay | Admitting: Family Medicine

## 2023-10-21 ENCOUNTER — Other Ambulatory Visit (HOSPITAL_BASED_OUTPATIENT_CLINIC_OR_DEPARTMENT_OTHER): Payer: Self-pay

## 2023-10-21 DIAGNOSIS — E559 Vitamin D deficiency, unspecified: Secondary | ICD-10-CM

## 2023-10-21 DIAGNOSIS — I10 Essential (primary) hypertension: Secondary | ICD-10-CM

## 2023-10-21 MED ORDER — METOPROLOL SUCCINATE ER 50 MG PO TB24
150.0000 mg | ORAL_TABLET | Freq: Every day | ORAL | 0 refills | Status: DC
  Filled 2023-10-21: qty 270, 90d supply, fill #0

## 2023-10-24 ENCOUNTER — Other Ambulatory Visit (HOSPITAL_BASED_OUTPATIENT_CLINIC_OR_DEPARTMENT_OTHER): Payer: Self-pay

## 2023-10-24 ENCOUNTER — Other Ambulatory Visit: Payer: Self-pay | Admitting: Family Medicine

## 2023-10-24 DIAGNOSIS — I10 Essential (primary) hypertension: Secondary | ICD-10-CM

## 2023-11-03 ENCOUNTER — Other Ambulatory Visit (HOSPITAL_BASED_OUTPATIENT_CLINIC_OR_DEPARTMENT_OTHER): Payer: Self-pay

## 2023-11-05 ENCOUNTER — Other Ambulatory Visit (HOSPITAL_BASED_OUTPATIENT_CLINIC_OR_DEPARTMENT_OTHER): Payer: Self-pay

## 2023-11-07 ENCOUNTER — Encounter: Payer: 59 | Admitting: Family Medicine

## 2023-12-01 ENCOUNTER — Other Ambulatory Visit (HOSPITAL_BASED_OUTPATIENT_CLINIC_OR_DEPARTMENT_OTHER): Payer: Self-pay

## 2023-12-01 ENCOUNTER — Other Ambulatory Visit (HOSPITAL_COMMUNITY): Payer: Self-pay

## 2023-12-02 ENCOUNTER — Other Ambulatory Visit (HOSPITAL_BASED_OUTPATIENT_CLINIC_OR_DEPARTMENT_OTHER): Payer: Self-pay

## 2023-12-20 ENCOUNTER — Other Ambulatory Visit: Payer: Self-pay | Admitting: Medical

## 2023-12-22 ENCOUNTER — Other Ambulatory Visit (HOSPITAL_BASED_OUTPATIENT_CLINIC_OR_DEPARTMENT_OTHER): Payer: Self-pay

## 2023-12-24 ENCOUNTER — Other Ambulatory Visit: Payer: Self-pay | Admitting: Medical

## 2023-12-25 ENCOUNTER — Other Ambulatory Visit: Payer: Self-pay | Admitting: Medical

## 2023-12-25 ENCOUNTER — Other Ambulatory Visit (HOSPITAL_BASED_OUTPATIENT_CLINIC_OR_DEPARTMENT_OTHER): Payer: Self-pay

## 2023-12-26 ENCOUNTER — Other Ambulatory Visit (HOSPITAL_BASED_OUTPATIENT_CLINIC_OR_DEPARTMENT_OTHER): Payer: Self-pay

## 2023-12-26 MED ORDER — IPRATROPIUM BROMIDE 0.06 % NA SOLN
2.0000 | Freq: Three times a day (TID) | NASAL | 0 refills | Status: DC
  Filled 2023-12-26: qty 15, 25d supply, fill #0

## 2023-12-29 ENCOUNTER — Other Ambulatory Visit (HOSPITAL_BASED_OUTPATIENT_CLINIC_OR_DEPARTMENT_OTHER): Payer: Self-pay

## 2024-01-20 ENCOUNTER — Encounter: Payer: Self-pay | Admitting: Medical

## 2024-01-20 ENCOUNTER — Ambulatory Visit: Admitting: Medical

## 2024-01-20 ENCOUNTER — Other Ambulatory Visit (HOSPITAL_BASED_OUTPATIENT_CLINIC_OR_DEPARTMENT_OTHER): Payer: Self-pay

## 2024-01-20 VITALS — BP 137/78 | HR 74 | Temp 97.8°F | Resp 18 | Ht 65.0 in | Wt 218.4 lb

## 2024-01-20 DIAGNOSIS — J309 Allergic rhinitis, unspecified: Secondary | ICD-10-CM | POA: Diagnosis not present

## 2024-01-20 DIAGNOSIS — R0989 Other specified symptoms and signs involving the circulatory and respiratory systems: Secondary | ICD-10-CM | POA: Diagnosis not present

## 2024-01-20 DIAGNOSIS — J302 Other seasonal allergic rhinitis: Secondary | ICD-10-CM

## 2024-01-20 MED ORDER — LEVOCETIRIZINE DIHYDROCHLORIDE 5 MG PO TABS
5.0000 mg | ORAL_TABLET | Freq: Every evening | ORAL | 3 refills | Status: DC
  Filled 2024-01-20: qty 30, 30d supply, fill #0

## 2024-01-20 MED ORDER — AZELASTINE HCL 137 MCG/SPRAY NA SOLN
1.0000 | Freq: Two times a day (BID) | NASAL | 1 refills | Status: AC
  Filled 2024-01-20: qty 30, 30d supply, fill #0

## 2024-01-20 MED ORDER — IPRATROPIUM BROMIDE 0.06 % NA SOLN
2.0000 | Freq: Three times a day (TID) | NASAL | 0 refills | Status: AC
  Filled 2024-01-20: qty 15, 25d supply, fill #0

## 2024-01-20 NOTE — Progress Notes (Unsigned)
   Subjective:    Patient ID: Tracey Page, female    DOB: [birth year], 68 y.o.   MRN: [id number]  HPI Tracey Page is a 68 year old female with a history of severe year-round allergies who presents with a persistent runny nose.  She has experienced continuous rhinorrhea from the left nostril since October, initially accompanied by a cough. The cough resolved after a week of treatment with a nasal spray and pills, but the rhinorrhea persisted, especially when bending over, although it has somewhat decreased in intensity.  She has been using Atrovent nasal spray, administering two sprays in the left nostril three times daily, but ran out of it four days ago. The nasal spray provided some relief, but symptoms have worsened since discontinuation.  She has a history of severe year-round allergies and suspects that her current symptoms may be related to pollen exposure. She typically wears a mask while cooking to prevent exacerbation of her symptoms.  She has been using Xyzal antihistamine, which was previously prescribed, but was not using it in combination with the nasal spray. She confirms prior use of Astelin for her symptoms.   Review of Systems       Objective:   Physical Exam  General Mental Status- Alert. General Appearance- Not in acute distress.   Skin General: Color- Normal Color. Moisture- Normal Moisture.  Neck Carotid Arteries- Normal color. Moisture- Normal Moisture. No carotid bruits. No JVD.  Chest and Lung Exam Auscultation: Breath Sounds:-CTA  Cardiovascular Auscultation:Rythm- RRR Murmurs & Other Heart Sounds:Auscultation of the heart reveals- No Murmurs.  Abdomen Inspection:-Inspeection Normal. Palpation/Percussion:Note:No mass. Palpation and Percussion of the abdomen reveal- Non Tender, Non Distended + BS, no rebound or guarding.   Neurologic Cranial Nerve exam:- CN III-XII intact(No nystagmus), symmetric smile. Strength:- 5/5 equal and symmetric  strength both upper and lower extremities.       Assessment & Plan:   Patient Instructions  Allergic Rhinitis Chronic allergic rhinitis with severe rhinorrhea exacerbated by seasonal pollen. Ipratropium and Xyzal expected to manage symptoms effectively. Astelin refilled as precaution for travel. To use if needed. - Refill ipratropium nasal spray. Use two sprays in affected nostril three times daily. - Refill Xyzal antihistamine for two months. Use concurrently with ipratropium. - Attempt discontinuation of both medications in early summer to assess symptom recurrence. - Refill Astelin nasal spray as precaution for travel.(use if needed) - Contact office if symptoms worsen before travel.  Follow up date before you leave for trip if needed.

## 2024-01-20 NOTE — Patient Instructions (Signed)
 Allergic Rhinitis Chronic allergic rhinitis with severe rhinorrhea exacerbated by seasonal pollen. Ipratropium and Xyzal expected to manage symptoms effectively. Astelin refilled as precaution for travel. To use if needed. - Refill ipratropium nasal spray. Use two sprays in affected nostril three times daily. - Refill Xyzal antihistamine for two months. Use concurrently with ipratropium. - Attempt discontinuation of both medications in early summer to assess symptom recurrence. - Refill Astelin nasal spray as precaution for travel.(use if needed) - Contact office if symptoms worsen before travel.  Follow up date before you leave for trip if needed.

## 2024-01-21 ENCOUNTER — Other Ambulatory Visit (HOSPITAL_BASED_OUTPATIENT_CLINIC_OR_DEPARTMENT_OTHER): Payer: Self-pay

## 2024-01-21 ENCOUNTER — Encounter (HOSPITAL_BASED_OUTPATIENT_CLINIC_OR_DEPARTMENT_OTHER): Payer: Self-pay

## 2024-01-21 ENCOUNTER — Other Ambulatory Visit: Payer: Self-pay | Admitting: Family Medicine

## 2024-01-21 ENCOUNTER — Other Ambulatory Visit: Payer: Self-pay

## 2024-01-21 DIAGNOSIS — I10 Essential (primary) hypertension: Secondary | ICD-10-CM

## 2024-01-21 MED ORDER — METOPROLOL SUCCINATE ER 50 MG PO TB24
150.0000 mg | ORAL_TABLET | Freq: Every day | ORAL | 0 refills | Status: DC
  Filled 2024-01-21: qty 270, 90d supply, fill #0

## 2024-01-21 MED ORDER — MOUNJARO 7.5 MG/0.5ML ~~LOC~~ SOAJ
7.5000 mg | SUBCUTANEOUS | 5 refills | Status: DC
  Filled 2024-01-21: qty 2, 28d supply, fill #0
  Filled 2024-02-12: qty 2, 28d supply, fill #1

## 2024-01-21 MED ORDER — AMLODIPINE BESYLATE 10 MG PO TABS
10.0000 mg | ORAL_TABLET | Freq: Every day | ORAL | 0 refills | Status: DC
  Filled 2024-01-21: qty 90, 90d supply, fill #0

## 2024-02-13 ENCOUNTER — Other Ambulatory Visit (HOSPITAL_BASED_OUTPATIENT_CLINIC_OR_DEPARTMENT_OTHER): Payer: Self-pay

## 2024-02-16 ENCOUNTER — Ambulatory Visit (INDEPENDENT_AMBULATORY_CARE_PROVIDER_SITE_OTHER): Payer: 59 | Admitting: Family Medicine

## 2024-02-16 ENCOUNTER — Other Ambulatory Visit (HOSPITAL_BASED_OUTPATIENT_CLINIC_OR_DEPARTMENT_OTHER): Payer: Self-pay

## 2024-02-16 ENCOUNTER — Encounter: Payer: Self-pay | Admitting: Family Medicine

## 2024-02-16 VITALS — BP 120/72 | HR 74 | Temp 98.2°F | Resp 18 | Ht 65.0 in | Wt 220.6 lb

## 2024-02-16 DIAGNOSIS — I10 Essential (primary) hypertension: Secondary | ICD-10-CM

## 2024-02-16 DIAGNOSIS — Z0000 Encounter for general adult medical examination without abnormal findings: Secondary | ICD-10-CM

## 2024-02-16 DIAGNOSIS — Z7985 Long-term (current) use of injectable non-insulin antidiabetic drugs: Secondary | ICD-10-CM

## 2024-02-16 DIAGNOSIS — E039 Hypothyroidism, unspecified: Secondary | ICD-10-CM

## 2024-02-16 DIAGNOSIS — E559 Vitamin D deficiency, unspecified: Secondary | ICD-10-CM

## 2024-02-16 DIAGNOSIS — E1165 Type 2 diabetes mellitus with hyperglycemia: Secondary | ICD-10-CM

## 2024-02-16 DIAGNOSIS — E785 Hyperlipidemia, unspecified: Secondary | ICD-10-CM | POA: Diagnosis not present

## 2024-02-16 DIAGNOSIS — E2839 Other primary ovarian failure: Secondary | ICD-10-CM

## 2024-02-16 DIAGNOSIS — L84 Corns and callosities: Secondary | ICD-10-CM

## 2024-02-16 LAB — MICROALBUMIN / CREATININE URINE RATIO
Creatinine,U: 347.4 mg/dL
Microalb Creat Ratio: 16.7 mg/g (ref 0.0–30.0)
Microalb, Ur: 5.8 mg/dL — ABNORMAL HIGH (ref 0.0–1.9)

## 2024-02-16 MED ORDER — VITAMIN D (ERGOCALCIFEROL) 1.25 MG (50000 UNIT) PO CAPS
50000.0000 [IU] | ORAL_CAPSULE | ORAL | 3 refills | Status: AC
  Filled 2024-02-16: qty 12, 84d supply, fill #0
  Filled 2024-05-13: qty 12, 84d supply, fill #1
  Filled 2024-08-05: qty 12, 84d supply, fill #2

## 2024-02-16 MED ORDER — LEVOCETIRIZINE DIHYDROCHLORIDE 5 MG PO TABS
5.0000 mg | ORAL_TABLET | Freq: Every evening | ORAL | 0 refills | Status: AC
  Filled 2024-02-16: qty 30, 30d supply, fill #0

## 2024-02-16 MED ORDER — TIRZEPATIDE 10 MG/0.5ML ~~LOC~~ SOAJ
10.0000 mg | SUBCUTANEOUS | 3 refills | Status: AC
  Filled 2024-02-16: qty 6, 84d supply, fill #0
  Filled 2024-05-13: qty 6, 84d supply, fill #1
  Filled 2024-08-23: qty 6, 84d supply, fill #2

## 2024-02-16 NOTE — Assessment & Plan Note (Signed)
Check labs  Con't synthroid 

## 2024-02-16 NOTE — Assessment & Plan Note (Signed)
 Tolerating statin, encouraged heart healthy diet, avoid trans fats, minimize simple carbs and saturated fats. Increase exercise as tolerated

## 2024-02-16 NOTE — Progress Notes (Signed)
 Established Patient Office Visit  Subjective   Patient ID: Tracey Page, female    DOB: [birth year]  Age: 68 y.o. MRN: [id number]  Chief Complaint  Patient presents with   Annual Exam    Pt states fasting. Pt has mammo/pap scheduled with GYN at the end of the month    HPI Discussed the use of AI scribe software for clinical note transcription with the patient, who gave verbal consent to proceed.  History of Present Illness Tracey Page is a 68 year old female with diabetes who presents for medication management and allergy symptoms.  She has been experiencing unilateral rhinorrhea since October, which initially improved with nasal spray but has since returned. She has been using azelastine  and ipratropium nasal sprays, but they are not as effective as before. She has not been taking her antihistamine, Xyzal  (levocetirizine), regularly. Her allergies have worsened with age.  Her hair has been thinning, which she attributes to weight loss. She is currently on Mounjaro  7.5 mg and has been stable at a weight of 218-219 lbs. She is considering increasing the dose to aid further weight loss.  She has not been checking her blood glucose levels regularly due to the cost of continuous glucose monitors like Dexcom and Libre, which are expensive even with GoodRx discounts. She prefers not to perform fingerstick glucose testing.  She has not taken her prescribed vitamin D  since January and prefers the weekly prescription dose over daily over-the-counter options.  She has a corn on her right foot and calluses on both feet, which she used to manage with a scraper but is now cautious due to her diabetes.  She is scheduled for a mammogram at the end of the month with her OB-GYN and has an eye appointment next month. She has not scheduled a bone density test.  She is considering retirement but is hesitant due to financial concerns and a desire to stay active. She worked as a Production designer, theatre/television/film at [employer] for  23 years. She sometimes feels cold, which she attributes to aging. No changes in family history, and no issues with her stomach or joints.   Patient Active Problem List   Diagnosis Date Noted   Hx of adenomatous polyps of colon 06/03/2022   Preventative health care 09/20/2021   Internal hemorrhoid, bleeding 08/24/2021   Gastroesophageal reflux disease 08/24/2021   Type 2 diabetes mellitus without complication, without long-term current use of insulin  (HCC) 08/05/2019   Vitamin D  deficiency 08/05/2019   Class 3 severe obesity with serious comorbidity and body mass index (BMI) of 50.0 to 59.9 in adult 08/05/2019   Polyp of cecum    Diverticulosis of colon without hemorrhage    Hemorrhoids    Severe obesity (BMI >= 40) (HCC) 10/21/2013   Hyperlipidemia LDL goal <100 06/27/2010   CARDIAC MURMUR 06/27/2010   OTITIS MEDIA, SEROUS, RIGHT 06/08/2010   DYSURIA 06/16/2009   EDEMA 08/22/2008   ECTOPIC PREGNANCY 04/26/2008   MORBID OBESITY 12/10/2007   Essential hypertension 11/17/2007   CHEST PAIN UNSPECIFIED 11/17/2007   Hypothyroidism 04/21/2007   Past Medical History:  Diagnosis Date   Anxiety    Bilateral swelling of feet and ankles    Diabetes mellitus without complication (HCC)    Diverticulitis    Food allergy    Hx of adenomatous colonic polyps 12/2018   Hyperlipidemia    Hypertension    Hypothyroidism    Lymphedema    Obesity    Pre-diabetes    Thyroid  disease  Past Surgical History:  Procedure Laterality Date   COLONOSCOPY WITH PROPOFOL  N/A 12/17/2018   Procedure: COLONOSCOPY WITH PROPOFOL ;  Surgeon: Janel Medford, MD;  Location: [facility];  Service: Endoscopy;  Laterality: N/A;   ECTOPIC PREGNANCY SURGERY     POLYPECTOMY  12/17/2018   Procedure: POLYPECTOMY;  Surgeon: Janel Medford, MD;  Location: [facility];  Service: Endoscopy;;   Social History   Tobacco Use   Smoking status: Some Days    Current packs/day: 0.30    Average packs/day: 0.3 packs/day  for 10.0 years (3.0 ttl pk-yrs)    Types: Cigarettes   Smokeless tobacco: Never  Vaping Use   Vaping status: Never Used  Substance Use Topics   Alcohol use: Yes    Comment: socially, occ wine   Drug use: No   Social History   Socioeconomic History   Marital status: Single    Spouse name: Not on file   Number of children: Not on file   Years of education: Not on file   Highest education level: Bachelor's degree (e.g., BA, AB, BS)  Occupational History   Occupation: retired   Occupation: Art therapist  Tobacco Use   Smoking status: Some Days    Current packs/day: 0.30    Average packs/day: 0.3 packs/day for 10.0 years (3.0 ttl pk-yrs)    Types: Cigarettes   Smokeless tobacco: Never  Vaping Use   Vaping status: Never Used  Substance and Sexual Activity   Alcohol use: Yes    Comment: socially, occ wine   Drug use: No   Sexual activity: Yes    Birth control/protection: Post-menopausal  Other Topics Concern   Not on file  Social History Narrative   Exercise--- walking 1 mile 3x a week    Social Drivers of Health   Financial Resource Strain: Low Risk  (11/06/2023)   Overall Financial Resource Strain (CARDIA)    Difficulty of Paying Living Expenses: Not very hard  Food Insecurity: No Food Insecurity (11/06/2023)   Hunger Vital Sign    Worried About Running Out of Food in the Last Year: Never true    Ran Out of Food in the Last Year: Never true  Transportation Needs: No Transportation Needs (11/06/2023)   PRAPARE - Administrator, Civil Service (Medical): No    Lack of Transportation (Non-Medical): No  Physical Activity: Insufficiently Active (11/06/2023)   Exercise Vital Sign    Days of Exercise per Week: 2 days    Minutes of Exercise per Session: 20 min  Stress: No Stress Concern Present (11/06/2023)   [employer] of Occupational Health - Occupational Stress Questionnaire    Feeling of Stress : Only a little  Social Connections: Moderately  Integrated (11/06/2023)   Social Connection and Isolation Panel [NHANES]    Frequency of Communication with Friends and Family: Twice a week    Frequency of Social Gatherings with Friends and Family: Once a week    Attends Religious Services: More than 4 times per year    Active Member of [employer] or Organizations: Yes    Attends Banker Meetings: 1 to 4 times per year    Marital Status: Divorced  Catering manager Violence: Not on file   Family Status  Relation Name Status   Mother  Alive   Father  Deceased at age 11       aneurysm   MGF  (Not Specified)   Other  (Not Specified)   Other  (Not Specified)  Other  (Not Specified)   Neg Hx  (Not Specified)  No partnership data on file   Family History  Problem Relation Age of Onset   Arthritis Mother    Hypertension Mother    Heart disease Mother    Anxiety disorder Mother    Thyroid  disease Mother    Obesity Mother    Aneurysm Father    Sudden death Father    Diabetes Maternal Grandfather    Hyperlipidemia Other    Hypertension Other    Coronary artery disease Other    Colon cancer Neg Hx    Colon polyps Neg Hx    Esophageal cancer Neg Hx    Stomach cancer Neg Hx    Rectal cancer Neg Hx    Allergies  Allergen Reactions   Diovan  [Valsartan ] Swelling and Other (See Comments)    angioedema   Egg-Derived Products Swelling   Iodine Swelling   Lisinopril Swelling   Misc. Sulfonamide Containing Compounds    Shellfish Allergy Swelling    Swelling of tongue   Sulfonamide Derivatives Swelling    Swelling in hand/knees      Review of Systems  Constitutional:  Negative for fever and malaise/fatigue.  HENT:  Positive for congestion.   Eyes:  Negative for blurred vision.  Respiratory:  Negative for shortness of breath.   Cardiovascular:  Negative for chest pain, palpitations and leg swelling.  Gastrointestinal:  Negative for abdominal pain, blood in stool and nausea.  Genitourinary:  Negative for dysuria and  frequency.  Musculoskeletal:  Negative for falls.  Skin:  Negative for rash.  Neurological:  Negative for dizziness, loss of consciousness and headaches.  Endo/Heme/Allergies:  Negative for environmental allergies.  Psychiatric/Behavioral:  Negative for depression. The patient is not nervous/anxious.       Objective:     BP 120/72 (BP Location: Right Arm, Patient Position: Sitting, Cuff Size: Large)   Pulse 74   Temp 98.2 F (36.8 C) (Oral)   Resp 18   Ht 5\' 5"  (1.651 m)   Wt 220 lb 9.6 oz (100.1 kg)   SpO2 96%   BMI 36.71 kg/m     Physical Exam Vitals and nursing note reviewed.  Constitutional:      General: She is not in acute distress.    Appearance: Normal appearance. She is well-developed.  HENT:     Head: Normocephalic and atraumatic.     Right Ear: Tympanic membrane, ear canal and external ear normal. There is no impacted cerumen.     Left Ear: Tympanic membrane, ear canal and external ear normal. There is no impacted cerumen.     Nose: Nose normal.     Mouth/Throat:     Mouth: Mucous membranes are moist.     Pharynx: Oropharynx is clear. No oropharyngeal exudate or posterior oropharyngeal erythema.  Eyes:     General: No scleral icterus.       Right eye: No discharge.        Left eye: No discharge.     Conjunctiva/sclera: Conjunctivae normal.     Pupils: Pupils are equal, round, and reactive to light.  Neck:     Thyroid : No thyromegaly or thyroid  tenderness.     Vascular: No JVD.  Cardiovascular:     Rate and Rhythm: Normal rate and regular rhythm.     Heart sounds: Normal heart sounds. No murmur heard. Pulmonary:     Effort: Pulmonary effort is normal. No respiratory distress.     Breath sounds: Normal  breath sounds.  Abdominal:     General: Bowel sounds are normal. There is no distension.     Palpations: Abdomen is soft. There is no mass.     Tenderness: There is no abdominal tenderness. There is no guarding or rebound.  Musculoskeletal:         General: Normal range of motion.     Cervical back: Normal range of motion and neck supple.     Right lower leg: No edema.     Left lower leg: No edema.  Lymphadenopathy:     Cervical: No cervical adenopathy.  Skin:    General: Skin is warm and dry.     Findings: No erythema or rash.  Neurological:     Mental Status: She is alert and oriented to person, place, and time.     Cranial Nerves: No cranial nerve deficit.     Deep Tendon Reflexes: Reflexes are normal and symmetric.  Psychiatric:        Mood and Affect: Mood normal.        Behavior: Behavior normal.        Thought Content: Thought content normal.        Judgment: Judgment normal.      No results found for any visits on 02/16/24.  Last CBC Lab Results  Component Value Date   WBC 4.6 02/13/2023   HGB 14.4 02/13/2023   HCT 43.1 02/13/2023   MCV 93.6 02/13/2023   MCH 30.5 08/24/2021   RDW 15.5 02/13/2023   PLT 199.0 02/13/2023   Last metabolic panel Lab Results  Component Value Date   GLUCOSE 81 02/13/2023   NA 141 02/13/2023   K 3.9 02/13/2023   CL 102 02/13/2023   CO2 30 02/13/2023   BUN 18 02/13/2023   CREATININE 0.91 02/13/2023   GFR 65.48 02/13/2023   CALCIUM  9.2 02/13/2023   PROT 7.1 02/13/2023   ALBUMIN 4.0 02/13/2023   LABGLOB 3.1 02/23/2020   AGRATIO 1.3 02/23/2020   BILITOT 0.4 02/13/2023   ALKPHOS 71 02/13/2023   AST 20 02/13/2023   ALT 20 02/13/2023   ANIONGAP 8 04/11/2020   Last lipids Lab Results  Component Value Date   CHOL 139 02/13/2023   HDL 39.40 02/13/2023   LDLCALC 87 02/13/2023   LDLDIRECT 179.1 09/16/2012   TRIG 61.0 02/13/2023   CHOLHDL 4 02/13/2023   Last hemoglobin A1c Lab Results  Component Value Date   HGBA1C 5.6 02/13/2023   Last thyroid  functions Lab Results  Component Value Date   TSH 0.30 (L) 02/13/2023   T3TOTAL 127 06/02/2019   T4TOTAL 7.9 10/10/2015   Last vitamin D  Lab Results  Component Value Date   VD25OH 39.79 02/13/2023   Last vitamin B12  and Folate Lab Results  Component Value Date   VITAMINB12 247 02/23/2020   FOLATE 4.3 02/23/2020      The 10-year ASCVD risk score (Arnett DK, et al., 2019) is: 28.9%    Assessment & Plan:   Problem List Items Addressed This Visit       Unprioritized   Vitamin D  deficiency   Relevant Medications   Vitamin D , Ergocalciferol , (DRISDOL ) 1.25 MG (50000 UNIT) CAPS capsule   Other Relevant Orders   VITAMIN D  25 Hydroxy (Vit-D Deficiency, Fractures)   Preventative health care - Primary   Hypothyroidism   Check labs  Con't synthroid        Hyperlipidemia LDL goal <100   Tolerating statin, encouraged heart healthy diet, avoid trans fats, minimize  simple carbs and saturated fats. Increase exercise as tolerated       Essential hypertension   Well controlled, no changes to meds. Encouraged heart healthy diet such as the DASH diet and exercise as tolerated.        Relevant Orders   CBC with Differential/Platelet   Other Visit Diagnoses       Type 2 diabetes mellitus with hyperglycemia, without long-term current use of insulin  (HCC)       Relevant Medications   tirzepatide  (MOUNJARO ) 10 MG/0.5ML Pen   Other Relevant Orders   Comprehensive metabolic panel with GFR   Hemoglobin A1c   Microalbumin / creatinine urine ratio     Hyperlipidemia, unspecified hyperlipidemia type       Relevant Orders   Lipid panel   Comprehensive metabolic panel with GFR     Estrogen deficiency       Relevant Orders   DG Bone Density     Corn of foot       Relevant Orders   Ambulatory referral to Podiatry     Assessment and Plan Assessment & Plan Type 2 diabetes mellitus   Her type 2 diabetes is managed with Mounjaro  7.5 mg, and her weight remains stable at 218-219 lbs. Blood sugar monitoring is limited due to cost concerns with continuous glucose monitors. Increase Mounjaro  dose to 10 mg and consider alternative dosing schedules if side effects occur. Encourage her to inquire about  over-the-counter continuous glucose monitors for more cost-effective options.  Corn on right foot   She has a corn on her right foot, which she manages with a scraper, not recommended for diabetics. Recommend referral to a podiatrist for proper corn management.  Allergic rhinitis   She experiences chronic allergic rhinitis with persistent unilateral nasal drainage and ear fullness since October. Previous nasal sprays provided only temporary relief. Continue azelastine  and ipratropium nasal sprays. Start levocetirizine daily to reduce nasal drainage and refill the levocetirizine prescription.    Return in about 6 months (around 08/18/2024).    Cardin Nitschke R Lowne Chase, DO

## 2024-02-16 NOTE — Assessment & Plan Note (Signed)
 Well controlled, no changes to meds. Encouraged heart healthy diet such as the DASH diet and exercise as tolerated.

## 2024-02-16 NOTE — Patient Instructions (Signed)
 Preventive Care 43 Years and Older, Female Preventive care refers to lifestyle choices and visits with your health care provider that can promote health and wellness. Preventive care visits are also called wellness exams. What can I expect for my preventive care visit? Counseling Your health care provider may ask you questions about your: Medical history, including: Past medical problems. Family medical history. Pregnancy and menstrual history. History of falls. Current health, including: Memory and ability to understand (cognition). Emotional well-being. Home life and relationship well-being. Sexual activity and sexual health. Lifestyle, including: Alcohol, nicotine or tobacco, and drug use. Access to firearms. Diet, exercise, and sleep habits. Work and work Astronomer. Sunscreen use. Safety issues such as seatbelt and bike helmet use. Physical exam Your health care provider will check your: Height and weight. These may be used to calculate your BMI (body mass index). BMI is a measurement that tells if you are at a healthy weight. Waist circumference. This measures the distance around your waistline. This measurement also tells if you are at a healthy weight and may help predict your risk of certain diseases, such as type 2 diabetes and high blood pressure. Heart rate and blood pressure. Body temperature. Skin for abnormal spots. What immunizations do I need?  Vaccines are usually given at various ages, according to a schedule. Your health care provider will recommend vaccines for you based on your age, medical history, and lifestyle or other factors, such as travel or where you work. What tests do I need? Screening Your health care provider may recommend screening tests for certain conditions. This may include: Lipid and cholesterol levels. Hepatitis C test. Hepatitis B test. HIV (human immunodeficiency virus) test. STI (sexually transmitted infection) testing, if you are at  risk. Lung cancer screening. Colorectal cancer screening. Diabetes screening. This is done by checking your blood sugar (glucose) after you have not eaten for a while (fasting). Mammogram. Talk with your health care provider about how often you should have regular mammograms. BRCA-related cancer screening. This may be done if you have a family history of breast, ovarian, tubal, or peritoneal cancers. Bone density scan. This is done to screen for osteoporosis. Talk with your health care provider about your test results, treatment options, and if necessary, the need for more tests. Follow these instructions at home: Eating and drinking  Eat a diet that includes fresh fruits and vegetables, whole grains, lean protein, and low-fat dairy products. Limit your intake of foods with high amounts of sugar, saturated fats, and salt. Take vitamin and mineral supplements as recommended by your health care provider. Do not drink alcohol if your health care provider tells you not to drink. If you drink alcohol: Limit how much you have to 0-1 drink a day. Know how much alcohol is in your drink. In the [place] one drink equals one 12 oz bottle of beer (355 mL), one 5 oz glass of wine (148 mL), or one 1 oz glass of hard liquor (44 mL). Lifestyle Brush your teeth every morning and night with fluoride toothpaste. Floss one time each day. Exercise for at least 30 minutes 5 or more days each week. Do not use any products that contain nicotine or tobacco. These products include cigarettes, chewing tobacco, and vaping devices, such as e-cigarettes. If you need help quitting, ask your health care provider. Do not use drugs. If you are sexually active, practice safe sex. Use a condom or other form of protection in order to prevent STIs. Take aspirin only as told by  your health care provider. Make sure that you understand how much to take and what form to take. Work with your health care provider to find out whether it  is safe and beneficial for you to take aspirin daily. Ask your health care provider if you need to take a cholesterol-lowering medicine (statin). Find healthy ways to manage stress, such as: Meditation, yoga, or listening to music. Journaling. Talking to a trusted person. Spending time with friends and family. Minimize exposure to UV radiation to reduce your risk of skin cancer. Safety Always wear your seat belt while driving or riding in a vehicle. Do not drive: If you have been drinking alcohol. Do not ride with someone who has been drinking. When you are tired or distracted. While texting. If you have been using any mind-altering substances or drugs. Wear a helmet and other protective equipment during sports activities. If you have firearms in your house, make sure you follow all gun safety procedures. What's next? Visit your health care provider once a year for an annual wellness visit. Ask your health care provider how often you should have your eyes and teeth checked. Stay up to date on all vaccines. This information is not intended to replace advice given to you by your health care provider. Make sure you discuss any questions you have with your health care provider. Document Revised: 03/21/2021 Document Reviewed: 03/21/2021 Elsevier Patient Education  2024 [employer].

## 2024-02-17 LAB — CBC WITH DIFFERENTIAL/PLATELET
Basophils Absolute: 0.1 10*3/uL (ref 0.0–0.1)
Basophils Relative: 0.9 % (ref 0.0–3.0)
Eosinophils Absolute: 0.1 10*3/uL (ref 0.0–0.7)
Eosinophils Relative: 1.3 % (ref 0.0–5.0)
HCT: 41.2 % (ref 36.0–46.0)
Hemoglobin: 13.5 g/dL (ref 12.0–15.0)
Lymphocytes Relative: 23.4 % (ref 12.0–46.0)
Lymphs Abs: 1.5 10*3/uL (ref 0.7–4.0)
MCHC: 32.8 g/dL (ref 30.0–36.0)
MCV: 94.6 fl (ref 78.0–100.0)
Monocytes Absolute: 0.3 10*3/uL (ref 0.1–1.0)
Monocytes Relative: 4 % (ref 3.0–12.0)
Neutro Abs: 4.6 10*3/uL (ref 1.4–7.7)
Neutrophils Relative %: 70.4 % (ref 43.0–77.0)
Platelets: 236 10*3/uL (ref 150.0–400.0)
RBC: 4.35 Mil/uL (ref 3.87–5.11)
RDW: 15.1 % (ref 11.5–15.5)
WBC: 6.6 10*3/uL (ref 4.0–10.5)

## 2024-02-17 LAB — COMPREHENSIVE METABOLIC PANEL WITH GFR
ALT: 40 U/L — ABNORMAL HIGH (ref 0–35)
AST: 27 U/L (ref 0–37)
Albumin: 4.1 g/dL (ref 3.5–5.2)
Alkaline Phosphatase: 63 U/L (ref 39–117)
BUN: 20 mg/dL (ref 6–23)
CO2: 27 meq/L (ref 19–32)
Calcium: 9.1 mg/dL (ref 8.4–10.5)
Chloride: 104 meq/L (ref 96–112)
Creatinine, Ser: 0.57 mg/dL (ref 0.40–1.20)
GFR: 93.6 mL/min (ref >60.00)
Glucose, Bld: 82 mg/dL (ref 70–99)
Potassium: 3.9 meq/L (ref 3.5–5.1)
Sodium: 141 meq/L (ref 135–145)
Total Bilirubin: 0.4 mg/dL (ref 0.2–1.2)
Total Protein: 7.3 g/dL (ref 6.0–8.3)

## 2024-02-17 LAB — LIPID PANEL
Cholesterol: 169 mg/dL (ref 0–200)
HDL: 47.9 mg/dL (ref >39.00)
LDL Cholesterol: 110 mg/dL — ABNORMAL HIGH (ref 0–99)
NonHDL: 120.91
Total CHOL/HDL Ratio: 4
Triglycerides: 55 mg/dL (ref 0.0–149.0)
VLDL: 11 mg/dL (ref 0.0–40.0)

## 2024-02-17 LAB — VITAMIN D 25 HYDROXY (VIT D DEFICIENCY, FRACTURES): VITD: 35.61 ng/mL (ref 30.00–100.00)

## 2024-02-17 LAB — HEMOGLOBIN A1C: Hgb A1c MFr Bld: 5.4 % (ref 4.6–6.5)

## 2024-02-23 ENCOUNTER — Other Ambulatory Visit (HOSPITAL_BASED_OUTPATIENT_CLINIC_OR_DEPARTMENT_OTHER): Payer: Self-pay

## 2024-02-23 ENCOUNTER — Other Ambulatory Visit: Payer: Self-pay | Admitting: Family Medicine

## 2024-02-23 ENCOUNTER — Ambulatory Visit: Payer: Self-pay | Admitting: Family Medicine

## 2024-02-23 DIAGNOSIS — E785 Hyperlipidemia, unspecified: Secondary | ICD-10-CM

## 2024-02-23 DIAGNOSIS — I10 Essential (primary) hypertension: Secondary | ICD-10-CM

## 2024-02-23 MED ORDER — EZETIMIBE 10 MG PO TABS
10.0000 mg | ORAL_TABLET | Freq: Every day | ORAL | 3 refills | Status: AC
  Filled 2024-02-23: qty 90, 90d supply, fill #0
  Filled 2024-05-30: qty 90, 90d supply, fill #1
  Filled 2024-09-05: qty 90, 90d supply, fill #2

## 2024-02-23 NOTE — Addendum Note (Signed)
 Addended by: Ruthe Coward on: 02/23/2024 01:11 PM   Modules accepted: Orders

## 2024-02-27 LAB — HM MAMMOGRAPHY

## 2024-03-04 LAB — HM PAP SMEAR: HM Pap smear: NORMAL

## 2024-03-08 ENCOUNTER — Ambulatory Visit: Admitting: Podiatry

## 2024-03-08 DIAGNOSIS — E1151 Type 2 diabetes mellitus with diabetic peripheral angiopathy without gangrene: Secondary | ICD-10-CM | POA: Diagnosis not present

## 2024-03-08 DIAGNOSIS — M2041 Other hammer toe(s) (acquired), right foot: Secondary | ICD-10-CM | POA: Diagnosis not present

## 2024-03-08 DIAGNOSIS — L84 Corns and callosities: Secondary | ICD-10-CM

## 2024-03-08 NOTE — Progress Notes (Signed)
 Chief Complaint  Patient presents with   Callouses    Pt stated that she has a corn on right 5th toe    HPI: 68 y.o. female presents today with concern of a painful corn on the right fifth toe.  She had previous surgery on the right 4th and 5th toes for bone alignment issues, she states.  She states this was a long time ago.  She acknowledges that her nails are long and difficult to trim but did not request nail care today.  States her blood sugar is fairly well-controlled.  Denies any burning or numbness in the feet.  She is not aware of the diabetic shoe program  Past Medical History:  Diagnosis Date   Anxiety    Bilateral swelling of feet and ankles    Diabetes mellitus without complication (HCC)    Diverticulitis    Food allergy    Hx of adenomatous colonic polyps 12/2018   Hyperlipidemia    Hypertension    Hypothyroidism    Lymphedema    Obesity    Pre-diabetes    Thyroid  disease    Past Surgical History:  Procedure Laterality Date   COLONOSCOPY WITH PROPOFOL  N/A 12/17/2018   Procedure: COLONOSCOPY WITH PROPOFOL ;  Surgeon: Janel Medford, MD;  Location: [facility];  Service: Endoscopy;  Laterality: N/A;   ECTOPIC PREGNANCY SURGERY     POLYPECTOMY  12/17/2018   Procedure: POLYPECTOMY;  Surgeon: Janel Medford, MD;  Location: [facility];  Service: Endoscopy;;   Allergies  Allergen Reactions   Diovan  [Valsartan ] Swelling and Other (See Comments)    angioedema   Egg-Derived Products Swelling   Iodine Swelling   Lisinopril Swelling   Misc. Sulfonamide Containing Compounds    Shellfish Allergy Swelling    Swelling of tongue   Sulfonamide Derivatives Swelling    Swelling in hand/knees    Physical Exam: Palpable pedal pulses noted bilateral.  No significant edema appreciated bilateral.  Digital hair is sparse.  There is evidence of previous surgery to the right 4th and 5th toes with a very short right fifth toe.  There is a hyperkeratotic lesion, focal in nature, on  the dorsal lateral aspect of the fifth toe PIPJ.  No ulceration is seen.  Minimal flexible contracture of some of the lesser toes.  In the right fifth toe has a varus rotation to it.  Protective sensation intact with Semmes Weinstein monofilament bilateral.  Temperature sensation intact as well.  Vibratory sensation was diminished at the left first MPJ but normal on the right.  Manual muscle testing 5/5 bilateral  Assessment/Plan of Care: 1. Type II diabetes mellitus with peripheral circulatory disorder (HCC)   2. Hammertoe of right foot   3. Corns    The patient had not requested nail care to be performed today.  Did offer to have this done as a regular service, along with any corn or callus shaving, every 3 months.  She did want to be scheduled for this.  In the meantime she did I guess she will trim her own nails.  The corn on the dorsal lateral aspect of the right fifth toe PIPJ area was shaved uneventfully with a sterile #[id number] blade.  Gel pads were dispensed to try to alleviate discomfort in the area.  Recommend diabetic shoes with custom diabetic insoles.  She might need a slightly wider width shoe in the forefoot to accommodate the fifth toe and decrease corn formation.  Will send in a prescription to [facility] along  with today's notes and they should contact the patient to get her set up with an appointment.  Follow-up in 3 months for [facility]   Laray Corbit DEstle Hemp, DPM, FACFAS [facility]     [address] [place], [place] [address]                Office [phone]  Fax [phone]

## 2024-03-12 ENCOUNTER — Other Ambulatory Visit: Payer: Self-pay | Admitting: Podiatry

## 2024-03-12 ENCOUNTER — Other Ambulatory Visit (HOSPITAL_BASED_OUTPATIENT_CLINIC_OR_DEPARTMENT_OTHER): Payer: Self-pay

## 2024-03-12 MED ORDER — KETOCONAZOLE 2 % EX CREA
1.0000 | TOPICAL_CREAM | Freq: Every day | CUTANEOUS | 2 refills | Status: AC
  Filled 2024-03-12: qty 60, 30d supply, fill #0
  Filled 2024-05-13: qty 60, 30d supply, fill #1

## 2024-03-12 NOTE — Progress Notes (Signed)
 Patient called in stating that a prescription for dry skin cream was supposed to be sent to her pharmacy.  Will send in prescription for ketoconazole 2% cream to apply once daily to the skin on the feet.  There are 2 refills.  [facility]

## 2024-04-01 ENCOUNTER — Other Ambulatory Visit (HOSPITAL_BASED_OUTPATIENT_CLINIC_OR_DEPARTMENT_OTHER): Payer: Self-pay

## 2024-04-02 ENCOUNTER — Other Ambulatory Visit: Payer: Self-pay

## 2024-04-02 ENCOUNTER — Other Ambulatory Visit (HOSPITAL_BASED_OUTPATIENT_CLINIC_OR_DEPARTMENT_OTHER): Payer: Self-pay

## 2024-04-02 MED ORDER — ROSUVASTATIN CALCIUM 40 MG PO TABS
40.0000 mg | ORAL_TABLET | Freq: Every day | ORAL | 6 refills | Status: AC
  Filled 2024-04-02: qty 30, 30d supply, fill #0
  Filled 2024-04-28: qty 30, 30d supply, fill #1
  Filled 2024-05-30: qty 30, 30d supply, fill #2
  Filled 2024-07-01: qty 30, 30d supply, fill #3
  Filled 2024-08-05: qty 30, 30d supply, fill #4
  Filled 2024-09-05: qty 30, 30d supply, fill #5
  Filled 2024-10-06: qty 30, 30d supply, fill #6

## 2024-04-02 MED ORDER — LEVOTHYROXINE SODIUM 125 MCG PO TABS
125.0000 ug | ORAL_TABLET | Freq: Every morning | ORAL | 5 refills | Status: AC
  Filled 2024-04-02: qty 26, 26d supply, fill #0
  Filled 2024-05-13: qty 26, 26d supply, fill #1

## 2024-04-05 ENCOUNTER — Other Ambulatory Visit: Payer: Self-pay

## 2024-04-06 ENCOUNTER — Other Ambulatory Visit (HOSPITAL_BASED_OUTPATIENT_CLINIC_OR_DEPARTMENT_OTHER): Payer: Self-pay

## 2024-04-06 ENCOUNTER — Other Ambulatory Visit (HOSPITAL_COMMUNITY): Payer: Self-pay

## 2024-04-13 ENCOUNTER — Ambulatory Visit (HOSPITAL_BASED_OUTPATIENT_CLINIC_OR_DEPARTMENT_OTHER)
Admission: RE | Admit: 2024-04-13 | Discharge: 2024-04-13 | Disposition: A | Discharge status: Home / Self Care | Source: Ambulatory Visit | Attending: Family Medicine | Admitting: Family Medicine

## 2024-04-13 DIAGNOSIS — E2839 Other primary ovarian failure: Secondary | ICD-10-CM | POA: Insufficient documentation

## 2024-04-28 ENCOUNTER — Other Ambulatory Visit: Payer: Self-pay | Admitting: Family Medicine

## 2024-04-28 DIAGNOSIS — I10 Essential (primary) hypertension: Secondary | ICD-10-CM

## 2024-04-29 ENCOUNTER — Other Ambulatory Visit (HOSPITAL_BASED_OUTPATIENT_CLINIC_OR_DEPARTMENT_OTHER): Payer: Self-pay

## 2024-04-29 ENCOUNTER — Other Ambulatory Visit: Payer: Self-pay

## 2024-04-29 MED ORDER — METOPROLOL SUCCINATE ER 50 MG PO TB24
150.0000 mg | ORAL_TABLET | Freq: Every day | ORAL | 1 refills | Status: DC
  Filled 2024-04-29: qty 270, 90d supply, fill #0
  Filled 2024-08-05: qty 270, 90d supply, fill #1

## 2024-04-29 MED ORDER — AMLODIPINE BESYLATE 10 MG PO TABS
10.0000 mg | ORAL_TABLET | Freq: Every day | ORAL | 1 refills | Status: DC
  Filled 2024-04-29: qty 90, 90d supply, fill #0
  Filled 2024-08-05: qty 90, 90d supply, fill #1

## 2024-05-13 ENCOUNTER — Other Ambulatory Visit (HOSPITAL_BASED_OUTPATIENT_CLINIC_OR_DEPARTMENT_OTHER): Payer: Self-pay

## 2024-05-19 ENCOUNTER — Other Ambulatory Visit (INDEPENDENT_AMBULATORY_CARE_PROVIDER_SITE_OTHER)

## 2024-05-19 DIAGNOSIS — I10 Essential (primary) hypertension: Secondary | ICD-10-CM | POA: Diagnosis not present

## 2024-05-19 DIAGNOSIS — E785 Hyperlipidemia, unspecified: Secondary | ICD-10-CM | POA: Diagnosis not present

## 2024-05-19 LAB — COMPREHENSIVE METABOLIC PANEL WITH GFR
ALT: 64 U/L — ABNORMAL HIGH (ref 0–35)
AST: 47 U/L — ABNORMAL HIGH (ref 0–37)
Albumin: 4 g/dL (ref 3.5–5.2)
Alkaline Phosphatase: 57 U/L (ref 39–117)
BUN: 15 mg/dL (ref 6–23)
CO2: 30 meq/L (ref 19–32)
Calcium: 9 mg/dL (ref 8.4–10.5)
Chloride: 104 meq/L (ref 96–112)
Creatinine, Ser: 0.92 mg/dL (ref 0.40–1.20)
GFR: 64.06 mL/min (ref >60.00)
Glucose, Bld: 93 mg/dL (ref 70–99)
Potassium: 4 meq/L (ref 3.5–5.1)
Sodium: 142 meq/L (ref 135–145)
Total Bilirubin: 0.6 mg/dL (ref 0.2–1.2)
Total Protein: 7 g/dL (ref 6.0–8.3)

## 2024-05-19 LAB — LIPID PANEL
Cholesterol: 138 mg/dL (ref 0–200)
HDL: 39.5 mg/dL (ref >39.00)
LDL Cholesterol: 88 mg/dL (ref 0–99)
NonHDL: 98.83
Total CHOL/HDL Ratio: 4
Triglycerides: 54 mg/dL (ref 0.0–149.0)
VLDL: 10.8 mg/dL (ref 0.0–40.0)

## 2024-05-26 ENCOUNTER — Ambulatory Visit: Payer: Self-pay | Admitting: Family Medicine

## 2024-05-31 ENCOUNTER — Other Ambulatory Visit (HOSPITAL_BASED_OUTPATIENT_CLINIC_OR_DEPARTMENT_OTHER): Payer: Self-pay

## 2024-05-31 MED ORDER — LEVOTHYROXINE SODIUM 112 MCG PO TABS
112.0000 ug | ORAL_TABLET | Freq: Every morning | ORAL | 5 refills | Status: AC
  Filled 2024-05-31: qty 30, 30d supply, fill #0
  Filled 2024-07-01: qty 30, 30d supply, fill #1
  Filled 2024-08-05: qty 30, 30d supply, fill #2
  Filled 2024-09-05: qty 30, 30d supply, fill #3
  Filled 2024-10-06: qty 30, 30d supply, fill #4
  Filled 2024-10-27: qty 30, 30d supply, fill #5

## 2024-06-08 ENCOUNTER — Ambulatory Visit (INDEPENDENT_AMBULATORY_CARE_PROVIDER_SITE_OTHER): Admitting: Podiatry

## 2024-06-08 ENCOUNTER — Encounter: Payer: Self-pay | Admitting: Podiatry

## 2024-06-08 DIAGNOSIS — E1151 Type 2 diabetes mellitus with diabetic peripheral angiopathy without gangrene: Secondary | ICD-10-CM

## 2024-06-08 DIAGNOSIS — M79675 Pain in left toe(s): Secondary | ICD-10-CM | POA: Diagnosis not present

## 2024-06-08 DIAGNOSIS — M79674 Pain in right toe(s): Secondary | ICD-10-CM

## 2024-06-08 DIAGNOSIS — L84 Corns and callosities: Secondary | ICD-10-CM

## 2024-06-08 DIAGNOSIS — B351 Tinea unguium: Secondary | ICD-10-CM | POA: Diagnosis not present

## 2024-06-08 NOTE — Progress Notes (Unsigned)
 Subjective:  Patient ID: Tracey Page, female    DOB: 02-20-1956,  MRN: [id number]  Tracey Page presents to clinic today for:  Chief Complaint  Patient presents with   Diabetes    [facility] nail trim  A1c 5.4  No anti coag.multiple calluses, possible corn R 5th toe    Patient notes nails are thick and elongated, causing pain in shoe gear when ambulating.  She has painful corns/calluses present.    She did note that she wasn't pleased last visit that more time wasn't spent with her when she presented with painful corns/calluses.  Our scheduling policies were explained to the patient in detail today, noting we're a high volume practice and almost every established patient appointment is double-booked, so it limits provider time in each patient exam room.  Apologized for her inconvenience and feeling rushed.   PCP is Antonio Meth, [place] R, DO.  Last seen 02/16/24.  Past Medical History:  Diagnosis Date   Anxiety    Bilateral swelling of feet and ankles    Diabetes mellitus without complication (HCC)    Diverticulitis    Food allergy    Hx of adenomatous colonic polyps 12/2018   Hyperlipidemia    Hypertension    Hypothyroidism    Lymphedema    Obesity    Pre-diabetes    Thyroid  disease    Allergies  Allergen Reactions   Diovan  [Valsartan ] Swelling and Other (See Comments)    angioedema   Egg-Derived Products Swelling   Iodine Swelling   Lisinopril Swelling   Misc. Sulfonamide Containing Compounds    Shellfish Allergy Swelling    Swelling of tongue   Sulfonamide Derivatives Swelling    Swelling in hand/knees    Objective:  Tracey Page is a pleasant 68 y.o. female in NAD. AAO x 3.  Vascular Examination: Patient has palpable DP pulse, absent PT pulse bilateral.  Delayed capillary refill bilateral toes.  Sparse digital hair bilateral.  Proximal to distal cooling WNL bilateral.    Dermatological Examination: Interspaces are clear with no open lesions noted  bilateral.  Skin is shiny and atrophic bilateral.  Nails are 3-78mm thick, with yellowish/brown discoloration, subungual debris and distal onycholysis x10.  There is pain with compression of nails x10.  There are hyperkeratotic lesions noted on the dorsolateral aspect of right 5th toe PIPJ, plantarmedial right first MPJ,  bilateral submet 2, and left submet 5 .     Latest Ref Rng & Units 02/16/2024   12:05 PM  Hemoglobin A1C  Hemoglobin-A1c 4.6 - 6.5 % 5.4    Patient qualifies for at-risk foot care because of diabetes with mild PVD .  Assessment/Plan: 1. Pain due to onychomycosis of toenails of both feet   2. Corns   3. Type II diabetes mellitus with peripheral circulatory disorder (HCC)     Mycotic nails x10 were sharply debrided with sterile nail nippers and power debriding burr to decrease bulk and length.  Hyperkeratotic lesions x5 were shaved with #312 blade.  Locations noted above in dermatological exam.  All are located proximal to the DIPJs of toes.    Return in about 3 months (around 09/07/2024) for [facility]   Awanda CHARM Imperial, DPM, FACFAS [facility]     [address]  [place], [place] [address]                Office [phone]  Fax [phone]

## 2024-07-02 ENCOUNTER — Other Ambulatory Visit (HOSPITAL_COMMUNITY): Payer: Self-pay

## 2024-08-23 ENCOUNTER — Other Ambulatory Visit (HOSPITAL_BASED_OUTPATIENT_CLINIC_OR_DEPARTMENT_OTHER): Payer: Self-pay

## 2024-08-27 ENCOUNTER — Other Ambulatory Visit (HOSPITAL_BASED_OUTPATIENT_CLINIC_OR_DEPARTMENT_OTHER): Payer: Self-pay

## 2024-09-06 ENCOUNTER — Other Ambulatory Visit: Payer: Self-pay

## 2024-09-06 ENCOUNTER — Other Ambulatory Visit (HOSPITAL_BASED_OUTPATIENT_CLINIC_OR_DEPARTMENT_OTHER): Payer: Self-pay

## 2024-09-06 MED ORDER — COMIRNATY 30 MCG/0.3ML IM SUSY
0.3000 mL | PREFILLED_SYRINGE | Freq: Once | INTRAMUSCULAR | 0 refills | Status: AC
  Filled 2024-09-06: qty 0.3, 1d supply, fill #0

## 2024-09-07 ENCOUNTER — Ambulatory Visit: Admitting: Podiatry

## 2024-09-27 ENCOUNTER — Ambulatory Visit: Admitting: Podiatry

## 2024-09-27 DIAGNOSIS — L84 Corns and callosities: Secondary | ICD-10-CM | POA: Diagnosis not present

## 2024-09-27 DIAGNOSIS — M79674 Pain in right toe(s): Secondary | ICD-10-CM | POA: Diagnosis not present

## 2024-09-27 DIAGNOSIS — B351 Tinea unguium: Secondary | ICD-10-CM

## 2024-09-27 DIAGNOSIS — E1151 Type 2 diabetes mellitus with diabetic peripheral angiopathy without gangrene: Secondary | ICD-10-CM | POA: Diagnosis not present

## 2024-09-27 DIAGNOSIS — M79675 Pain in left toe(s): Secondary | ICD-10-CM

## 2024-09-27 NOTE — Progress Notes (Signed)
" °   °  °  Subjective:  Patient ID: Tracey Page, female    DOB: 1955-11-01,  MRN: [id number]  Tracey Page presents to clinic today for:  Chief Complaint  Patient presents with   [facility]     [facility] with callous A1c was 5.7 in May 2025 No anti coag.    Patient notes nails are thick and elongated, causing pain in shoe gear when ambulating.  She has painful corns and calluses on the right fifth toe, right submet 3, right medial bunion, and 2 calluses on the ball of the left foot  PCP is Antonio Meth, [place] R, DO.  Last seen around 04/28/2024  Past Medical History:  Diagnosis Date   Anxiety    Bilateral swelling of feet and ankles    Diabetes mellitus without complication (HCC)    Diverticulitis    Food allergy    Hx of adenomatous colonic polyps 12/2018   Hyperlipidemia    Hypertension    Hypothyroidism    Lymphedema    Obesity    Pre-diabetes    Thyroid  disease    Allergies[1]  Objective:  Tracey Page is a pleasant 68 y.o. female in NAD. AAO x 3.  Vascular Examination: Patient has palpable DP pulse, absent PT pulse bilateral.  Delayed capillary refill bilateral toes.  Sparse digital hair bilateral.  Proximal to distal cooling WNL bilateral.    Dermatological Examination: Interspaces are clear with no open lesions noted bilateral.  Skin is shiny and atrophic bilateral.  Nails are 3-83mm thick, with yellowish/brown discoloration, subungual debris and distal onycholysis x10.  There is pain with compression of nails x10.  There are hyperkeratotic lesions noted dorsal lateral aspect right fifth toe PIPJ, right submet 3, right medial first metatarsal head, left submet 3 and left submet 5..     Latest Ref Rng & Units 02/16/2024   12:05 PM  Hemoglobin A1C  Hemoglobin-A1c 4.6 - 6.5 % 5.4    Patient qualifies for at-risk foot care because of diabetes with PVD.  Assessment/Plan: 1. Pain due to onychomycosis of toenails of both feet   2. Corns   3. Type II diabetes mellitus with  peripheral circulatory disorder (HCC)    Mycotic nails x10 were sharply debrided with sterile nail nippers and power debriding burr to decrease bulk and length.  Hyperkeratotic lesions x 5 were shaved with #312 blade.  The locations are listed above in the dermatological examination and all lesions are proximal to the toe DIPJ's  Return in about 3 months (around 12/26/2024) for [facility].   Tracey Page, DPM, FACFAS [facility]     [address] [place], [place] [address]                Office [phone]  Fax [phone]    [1]  Allergies Allergen Reactions   Diovan  [Valsartan ] Swelling and Other (See Comments)    angioedema   Egg Protein-Containing Drug Products Swelling   Iodine Swelling   Lisinopril Swelling   Misc. Sulfonamide Containing Compounds    Shellfish Allergy Swelling    Swelling of tongue   Sulfonamide Derivatives Swelling    Swelling in hand/knees   "

## 2024-10-06 ENCOUNTER — Other Ambulatory Visit: Payer: Self-pay

## 2024-10-06 ENCOUNTER — Other Ambulatory Visit (HOSPITAL_BASED_OUTPATIENT_CLINIC_OR_DEPARTMENT_OTHER): Payer: Self-pay

## 2024-10-06 MED ORDER — FLUZONE HIGH-DOSE 0.5 ML IM SUSY
0.5000 mL | PREFILLED_SYRINGE | Freq: Once | INTRAMUSCULAR | 0 refills | Status: AC
  Filled 2024-10-06: qty 0.5, 1d supply, fill #0

## 2024-10-27 ENCOUNTER — Other Ambulatory Visit: Payer: Self-pay

## 2024-10-27 ENCOUNTER — Other Ambulatory Visit (HOSPITAL_BASED_OUTPATIENT_CLINIC_OR_DEPARTMENT_OTHER): Payer: Self-pay

## 2024-10-27 ENCOUNTER — Other Ambulatory Visit: Payer: Self-pay | Admitting: Family Medicine

## 2024-10-27 DIAGNOSIS — I10 Essential (primary) hypertension: Secondary | ICD-10-CM

## 2024-10-27 MED ORDER — AMLODIPINE BESYLATE 10 MG PO TABS
10.0000 mg | ORAL_TABLET | Freq: Every day | ORAL | 0 refills | Status: AC
  Filled 2024-10-27: qty 30, 30d supply, fill #0

## 2024-10-27 MED ORDER — METOPROLOL SUCCINATE ER 50 MG PO TB24
150.0000 mg | ORAL_TABLET | Freq: Every day | ORAL | 0 refills | Status: AC
  Filled 2024-10-27: qty 270, 90d supply, fill #0

## 2024-12-30 ENCOUNTER — Ambulatory Visit: Admitting: Podiatry
